# Patient Record
Sex: Female | Born: 1990 | Race: White | Hispanic: No | Marital: Married | Smoking: Never smoker
Health system: Southern US, Community
[De-identification: ages and names within clinical notes are randomized; demographics above are authoritative.]

## PROBLEM LIST (undated history)

## (undated) DIAGNOSIS — R109 Unspecified abdominal pain: Secondary | ICD-10-CM

## (undated) DIAGNOSIS — L732 Hidradenitis suppurativa: Secondary | ICD-10-CM

## (undated) DIAGNOSIS — E550 Rickets, active: Secondary | ICD-10-CM

## (undated) DIAGNOSIS — R112 Nausea with vomiting, unspecified: Secondary | ICD-10-CM

## (undated) DIAGNOSIS — K439 Ventral hernia without obstruction or gangrene: Secondary | ICD-10-CM

## (undated) DIAGNOSIS — N200 Calculus of kidney: Secondary | ICD-10-CM

## (undated) DIAGNOSIS — G8929 Other chronic pain: Secondary | ICD-10-CM

## (undated) DIAGNOSIS — K5900 Constipation, unspecified: Secondary | ICD-10-CM

## (undated) HISTORY — PX: APPENDECTOMY: SHX54

## (undated) HISTORY — PX: LEG SURGERY: SHX1003

## (undated) HISTORY — PX: OTHER SURGICAL HISTORY: SHX169

## (undated) HISTORY — PX: COLON SURGERY: SHX602

---

## 2013-03-19 ENCOUNTER — Emergency Department (HOSPITAL_COMMUNITY): Payer: Self-pay

## 2013-03-19 ENCOUNTER — Encounter (HOSPITAL_COMMUNITY): Payer: Self-pay | Admitting: *Deleted

## 2013-03-19 ENCOUNTER — Inpatient Hospital Stay (HOSPITAL_COMMUNITY)
Admission: EM | Admit: 2013-03-19 | Discharge: 2013-03-23 | DRG: 669 | Disposition: A | Discharge status: Home / Self Care | Payer: MEDICAID | Attending: Family Medicine | Admitting: Family Medicine

## 2013-03-19 DIAGNOSIS — E872 Acidosis, unspecified: Secondary | ICD-10-CM | POA: Diagnosis present

## 2013-03-19 DIAGNOSIS — N132 Hydronephrosis with renal and ureteral calculous obstruction: Secondary | ICD-10-CM

## 2013-03-19 DIAGNOSIS — Z87442 Personal history of urinary calculi: Secondary | ICD-10-CM

## 2013-03-19 DIAGNOSIS — E876 Hypokalemia: Secondary | ICD-10-CM

## 2013-03-19 DIAGNOSIS — D649 Anemia, unspecified: Secondary | ICD-10-CM | POA: Diagnosis present

## 2013-03-19 DIAGNOSIS — N179 Acute kidney failure, unspecified: Principal | ICD-10-CM

## 2013-03-19 DIAGNOSIS — N201 Calculus of ureter: Secondary | ICD-10-CM | POA: Diagnosis present

## 2013-03-19 DIAGNOSIS — N289 Disorder of kidney and ureter, unspecified: Secondary | ICD-10-CM

## 2013-03-19 DIAGNOSIS — Z8639 Personal history of other endocrine, nutritional and metabolic disease: Secondary | ICD-10-CM

## 2013-03-19 DIAGNOSIS — N390 Urinary tract infection, site not specified: Secondary | ICD-10-CM

## 2013-03-19 DIAGNOSIS — N2589 Other disorders resulting from impaired renal tubular function: Secondary | ICD-10-CM

## 2013-03-19 DIAGNOSIS — N269 Renal sclerosis, unspecified: Secondary | ICD-10-CM | POA: Diagnosis present

## 2013-03-19 DIAGNOSIS — N1330 Unspecified hydronephrosis: Secondary | ICD-10-CM | POA: Diagnosis present

## 2013-03-19 DIAGNOSIS — Z9049 Acquired absence of other specified parts of digestive tract: Secondary | ICD-10-CM

## 2013-03-19 DIAGNOSIS — D509 Iron deficiency anemia, unspecified: Secondary | ICD-10-CM

## 2013-03-19 DIAGNOSIS — Z862 Personal history of diseases of the blood and blood-forming organs and certain disorders involving the immune mechanism: Secondary | ICD-10-CM

## 2013-03-19 DIAGNOSIS — N139 Obstructive and reflux uropathy, unspecified: Secondary | ICD-10-CM | POA: Diagnosis present

## 2013-03-19 HISTORY — DX: Calculus of kidney: N20.0

## 2013-03-19 HISTORY — DX: Rickets, active: E55.0

## 2013-03-19 LAB — BASIC METABOLIC PANEL
CO2: 16 mEq/L — ABNORMAL LOW (ref 19–32)
Chloride: 103 mEq/L (ref 96–112)
GFR calc Af Amer: 28 mL/min — ABNORMAL LOW (ref >90)
Potassium: 2.8 mEq/L — ABNORMAL LOW (ref 3.5–5.1)
Sodium: 137 mEq/L (ref 135–145)

## 2013-03-19 LAB — CBC WITH DIFFERENTIAL/PLATELET
Basophils Absolute: 0 10*3/uL (ref 0.0–0.1)
Basophils Relative: 0 % (ref 0–1)
HCT: 34.9 % — ABNORMAL LOW (ref 36.0–46.0)
Lymphocytes Relative: 11 % — ABNORMAL LOW (ref 12–46)
Monocytes Absolute: 0.7 10*3/uL (ref 0.1–1.0)
Neutro Abs: 8.8 10*3/uL — ABNORMAL HIGH (ref 1.7–7.7)
Neutrophils Relative %: 82 % — ABNORMAL HIGH (ref 43–77)
Platelets: 264 10*3/uL (ref 150–400)
RDW: 17.2 % — ABNORMAL HIGH (ref 11.5–15.5)
WBC: 10.7 10*3/uL — ABNORMAL HIGH (ref 4.0–10.5)

## 2013-03-19 LAB — URINALYSIS, ROUTINE W REFLEX MICROSCOPIC
Glucose, UA: NEGATIVE mg/dL
Nitrite: NEGATIVE
pH: 7 (ref 5.0–8.0)

## 2013-03-19 LAB — URINE MICROSCOPIC-ADD ON

## 2013-03-19 LAB — POCT PREGNANCY, URINE: Preg Test, Ur: NEGATIVE

## 2013-03-19 MED ORDER — DEXTROSE 5 % IV SOLN
1.0000 g | Freq: Once | INTRAVENOUS | Status: AC
  Administered 2013-03-19: 1 g via INTRAVENOUS
  Filled 2013-03-19: qty 10

## 2013-03-19 MED ORDER — HYDROMORPHONE HCL PF 1 MG/ML IJ SOLN
1.0000 mg | Freq: Once | INTRAMUSCULAR | Status: AC
  Administered 2013-03-19: 1 mg via INTRAVENOUS
  Filled 2013-03-19: qty 1

## 2013-03-19 MED ORDER — ONDANSETRON HCL 4 MG/2ML IJ SOLN: 4.0000 mg | Freq: Three times a day (TID) | INTRAMUSCULAR | Status: DC | PRN

## 2013-03-19 MED ORDER — ONDANSETRON HCL 4 MG/2ML IJ SOLN
4.0000 mg | Freq: Once | INTRAMUSCULAR | Status: AC
  Administered 2013-03-19: 4 mg via INTRAVENOUS
  Filled 2013-03-19: qty 2

## 2013-03-19 MED ORDER — SODIUM CHLORIDE 0.9 % IV SOLN
INTRAVENOUS | Status: DC
  Administered 2013-03-19: 19:00:00 via INTRAVENOUS

## 2013-03-19 MED ORDER — HYDROMORPHONE HCL PF 1 MG/ML IJ SOLN: 1.0000 mg | INTRAMUSCULAR | Status: DC | PRN

## 2013-03-19 NOTE — ED Provider Notes (Signed)
History    CSN: [id number] Arrival date & time 03/19/13  1624  First MD Initiated Contact with Patient 03/19/13 1758     Chief Complaint  Patient presents with  . Flank Pain   (Consider location/radiation/quality/duration/timing/severity/associated sxs/prior Treatment) Patient is a 22 y.o. female presenting with flank pain.  Flank Pain Pertinent negatives include no chest pain, no abdominal pain, no headaches and no shortness of breath.   patient states that she's had flank pain for last week or 2. She states she was seen at [facility] and was given pain medicines. She states a CT was done. She states she's gotten worse. She's not exactly sure when she was seen there. He was with her last few days. She states she only has one kidney. She states she's had previous kidney stones. She's had some chills but denies fevers. She states she's had vomiting. She states she's gotten worse and she went to the hospital. She also states that her period was delayed and then started up and has been heavy.   Past Medical History  Diagnosis Date  . Kidney stones   . Rickets    Past Surgical History  Procedure Laterality Date  . Ureteral stents    . Has only 1 functioning  kidney    . Colon surgery    . Appendectomy    . Leg surgery     History reviewed. No pertinent family history. History  Substance Use Topics  . Smoking status: Never Smoker   . Smokeless tobacco: Not on file  . Alcohol Use: No   OB History   Grav Para Term Preterm Abortions TAB SAB Ect Mult Living                 Review of Systems  Constitutional: Positive for chills. Negative for activity change and appetite change.  HENT: Negative for neck stiffness.   Eyes: Negative for pain.  Respiratory: Negative for chest tightness and shortness of breath.   Cardiovascular: Negative for chest pain and leg swelling.  Gastrointestinal: Positive for nausea and vomiting. Negative for abdominal pain and diarrhea.   Genitourinary: Positive for flank pain and vaginal bleeding.  Musculoskeletal: Negative for back pain.  Skin: Negative for rash.  Neurological: Negative for weakness, numbness and headaches.  Psychiatric/Behavioral: Negative for behavioral problems.    Allergies  Review of patient's allergies indicates no known allergies.  Home Medications   Current Outpatient Rx  Name  Route  Sig  Dispense  Refill  . acetaminophen (TYLENOL) 500 MG tablet   Oral   Take 500-1,000 mg by mouth every 6 (six) hours as needed for pain.         . citalopram (CELEXA) 20 MG tablet   Oral   Take 20 mg by mouth daily.         Marland Kitchen HYDROcodone-acetaminophen (NORCO/VICODIN) 5-325 MG per tablet   Oral   Take 1 tablet by mouth every 6 (six) hours as needed for pain.         Marland Kitchen sulfamethoxazole-trimethoprim (BACTRIM DS,SEPTRA DS) 800-160 MG per tablet   Oral   Take 1 tablet by mouth 2 (two) times daily. For 10 days          BP 104/50  Pulse 80  Temp(Src) 98.9 F (37.2 C) (Oral)  Resp 19  Ht 4\' 11"  (1.499 m)  Wt 210 lb (95.255 kg)  BMI 42.39 kg/m2  SpO2 100%  LMP 03/17/2013 Physical Exam  Nursing note and vitals reviewed. Constitutional:  She is oriented to person, place, and time. She appears well-developed and well-nourished.  Patient is obese  HENT:  Head: Normocephalic and atraumatic.  Eyes: EOM are normal. Pupils are equal, round, and reactive to light.  Neck: Normal range of motion. Neck supple.  Cardiovascular: Normal rate, regular rhythm and normal heart sounds.   No murmur heard. Pulmonary/Chest: Effort normal and breath sounds normal. No respiratory distress. She has no wheezes. She has no rales.  Abdominal: Soft. Bowel sounds are normal. She exhibits no distension. There is tenderness. There is no rebound and no guarding.  Mild right lower quadrant abdominal pain. No rebound or guarding. She is obese  Genitourinary:  CVA tenderness on right  Musculoskeletal: Normal range of  motion.  Neurological: She is alert and oriented to person, place, and time. No cranial nerve deficit.  Skin: Skin is warm and dry.  Psychiatric: She has a normal mood and affect. Her speech is normal.    ED Course  Procedures (including critical care time) Labs Reviewed  URINALYSIS, ROUTINE W REFLEX MICROSCOPIC - Abnormal; Notable for the following:    APPearance HAZY (*)    Specific Gravity, Urine <1.005 (*)    Hgb urine dipstick LARGE (*)    Protein, ur TRACE (*)    Leukocytes, UA LARGE (*)    All other components within normal limits  URINE MICROSCOPIC-ADD ON - Abnormal; Notable for the following:    Squamous Epithelial / LPF FEW (*)    Bacteria, UA FEW (*)    All other components within normal limits  CBC WITH DIFFERENTIAL - Abnormal; Notable for the following:    WBC 10.7 (*)    Hemoglobin 10.8 (*)    HCT 34.9 (*)    MCV 77.4 (*)    MCH 23.9 (*)    RDW 17.2 (*)    Neutrophils Relative % 82 (*)    Neutro Abs 8.8 (*)    Lymphocytes Relative 11 (*)    All other components within normal limits  BASIC METABOLIC PANEL - Abnormal; Notable for the following:    Potassium 2.8 (*)    CO2 16 (*)    BUN 30 (*)    Creatinine, Ser 2.67 (*)    Calcium 8.3 (*)    GFR calc non Af Amer 25 (*)    GFR calc Af Amer 28 (*)    All other components within normal limits  URINE CULTURE  POCT PREGNANCY, URINE   Ct Abdomen Pelvis Wo Contrast  03/19/2013   *RADIOLOGY REPORT*  Clinical Data: Right flank pain for 2 weeks, vomiting, history of kidney stones  CT ABDOMEN AND PELVIS WITHOUT CONTRAST  Technique:  Multidetector CT imaging of the abdomen and pelvis was performed following the standard protocol without intravenous contrast.  Comparison: None.  Findings: The lung bases are clear.  The liver is unremarkable in the unenhanced state.  No calcified gallstones are seen.  The pancreas is normal in size.  The adrenal glands and spleen are unremarkable.  The stomach is moderately fluid distended  with no abnormality noted.  The left kidney is markedly atrophic and it does contain multiple calcifications.  The right kidney appears compensatorily hypertrophied.  There are multiple calcifications within the medullary portions of the right kidney consistent with medullary sponge kidney.   The largest calculus is in the lower pole collecting system of the right kidney measuring 18 mm in diameter. However, there is moderate hydronephrosis present with hydroureter. The point of obstruction is within  the mid pelvis and there appear to be at least three calculi stacked upon each other of approximately 5, 6, and 7 mm in diameter respectively.  The more distal right ureter is normal in caliber.  The left ureter is small to the urinary bladder.  The urinary bladder is decompressed.  The uterus is anteverted.  No fluid is seen within the pelvis.  The colon is decompressed and the terminal ileum is unremarkable.  There are three midline anterior abdominal wall hernias present. These hernias contain fat and nondistended loops of bowel. Minimally prominent groin lymph nodes are noted bilaterally.  IMPRESSION:  1.  Moderate right hydronephrosis and hydroureter to a point of partial obstruction by distal right ureteral calculi stacked upon each other of5, 6 and 7 mm in diameter respectively. 2.  Multiple right renal calculi consistent with medullary sponge kidney, and the right kidney is compensatorily hypertrophied. 3.  Atrophic left kidney with multiple calcifications as well.   Original Report Authenticated By: Dwyane Dee, M.D.   1. Ureteral stone with hydronephrosis   2. Renal insufficiency   3. UTI (urinary tract infection)     MDM   patient with right flank pain. It is possible UTI. Also has worsening renal insufficiency with a creatinine that has gone from 1.6 early in the morning yesterday to 2.6 today. CT showed hydronephrosis and 1 atrophic kidney on the other side. Patient be admitted to urology. I  discussed with Dr. Charlies Silvers R. Rubin Payor, MD 03/19/13 2123

## 2013-03-19 NOTE — ED Notes (Signed)
Rt flank pain for 2 weeks, says she was seen at [facility] for same. Vomiting, for last few days.  Hx of kidney stones

## 2013-03-19 NOTE — ED Notes (Signed)
Pt c/o right side flank pain x1 week. Pt was seen at [facility] last week and told she had multiple kidney stones. Pt states she has not passed a stone and reports symptoms have worsened. Pt also states "I went 3 months without a period but this week I began bleeding heavily".

## 2013-03-19 NOTE — ED Notes (Signed)
Patient ambulatory to restroom  ?

## 2013-03-20 ENCOUNTER — Encounter (HOSPITAL_COMMUNITY): Payer: Self-pay | Admitting: Internal Medicine

## 2013-03-20 DIAGNOSIS — N179 Acute kidney failure, unspecified: Secondary | ICD-10-CM | POA: Diagnosis present

## 2013-03-20 DIAGNOSIS — N132 Hydronephrosis with renal and ureteral calculous obstruction: Secondary | ICD-10-CM | POA: Diagnosis present

## 2013-03-20 DIAGNOSIS — Z8639 Personal history of other endocrine, nutritional and metabolic disease: Secondary | ICD-10-CM | POA: Diagnosis present

## 2013-03-20 DIAGNOSIS — N390 Urinary tract infection, site not specified: Secondary | ICD-10-CM | POA: Diagnosis present

## 2013-03-20 DIAGNOSIS — D649 Anemia, unspecified: Secondary | ICD-10-CM | POA: Diagnosis present

## 2013-03-20 DIAGNOSIS — E876 Hypokalemia: Secondary | ICD-10-CM | POA: Diagnosis present

## 2013-03-20 DIAGNOSIS — D509 Iron deficiency anemia, unspecified: Secondary | ICD-10-CM | POA: Diagnosis present

## 2013-03-20 DIAGNOSIS — N2589 Other disorders resulting from impaired renal tubular function: Secondary | ICD-10-CM | POA: Diagnosis present

## 2013-03-20 LAB — URINALYSIS, ROUTINE W REFLEX MICROSCOPIC
Bilirubin Urine: NEGATIVE
Ketones, ur: NEGATIVE mg/dL
Nitrite: NEGATIVE
Specific Gravity, Urine: <1.005 — ABNORMAL LOW (ref 1.005–1.030)
Urobilinogen, UA: 0.2 mg/dL (ref 0.0–1.0)
pH: 7 (ref 5.0–8.0)

## 2013-03-20 LAB — FOLATE: Folate: >20 ng/mL

## 2013-03-20 LAB — COMPREHENSIVE METABOLIC PANEL
Alkaline Phosphatase: 88 U/L (ref 39–117)
BUN: 30 mg/dL — ABNORMAL HIGH (ref 6–23)
Chloride: 111 mEq/L (ref 96–112)
GFR calc Af Amer: 31 mL/min — ABNORMAL LOW (ref >90)
Glucose, Bld: 82 mg/dL (ref 70–99)
Potassium: 3.2 mEq/L — ABNORMAL LOW (ref 3.5–5.1)
Total Bilirubin: 0.3 mg/dL (ref 0.3–1.2)
Total Protein: 6.9 g/dL (ref 6.0–8.3)

## 2013-03-20 LAB — URINE MICROSCOPIC-ADD ON

## 2013-03-20 LAB — NA AND K (SODIUM & POTASSIUM), RAND UR: Potassium Urine: 10 mEq/L

## 2013-03-20 LAB — CBC
Platelets: 225 10*3/uL (ref 150–400)
RDW: 17.4 % — ABNORMAL HIGH (ref 11.5–15.5)
WBC: 8.2 10*3/uL (ref 4.0–10.5)

## 2013-03-20 LAB — RETICULOCYTES: Retic Count, Absolute: 93.6 10*3/uL (ref 19.0–186.0)

## 2013-03-20 LAB — HEPATIC FUNCTION PANEL
ALT: 18 U/L (ref 0–35)
AST: 18 U/L (ref 0–37)
Total Protein: 7.8 g/dL (ref 6.0–8.3)

## 2013-03-20 LAB — CHLORIDE, URINE, RANDOM: Chloride Urine: 32 mEq/L

## 2013-03-20 LAB — BASIC METABOLIC PANEL
Chloride: 109 mEq/L (ref 96–112)
GFR calc Af Amer: 30 mL/min — ABNORMAL LOW (ref >90)
GFR calc non Af Amer: 26 mL/min — ABNORMAL LOW (ref >90)
Potassium: 3.2 mEq/L — ABNORMAL LOW (ref 3.5–5.1)
Sodium: 140 mEq/L (ref 135–145)

## 2013-03-20 LAB — IRON AND TIBC: Iron: 28 ug/dL — ABNORMAL LOW (ref 42–135)

## 2013-03-20 MED ORDER — CEFTRIAXONE SODIUM 1 G IJ SOLR
1.0000 g | INTRAMUSCULAR | Status: DC
  Administered 2013-03-20 – 2013-03-22 (×3): 1 g via INTRAVENOUS
  Filled 2013-03-20 (×6): qty 10

## 2013-03-20 MED ORDER — POTASSIUM CITRATE ER 10 MEQ (1080 MG) PO TBCR
20.0000 meq | EXTENDED_RELEASE_TABLET | Freq: Three times a day (TID) | ORAL | Status: DC
  Administered 2013-03-21 – 2013-03-22 (×2): 20 meq via ORAL
  Filled 2013-03-20 (×8): qty 2

## 2013-03-20 MED ORDER — ALUM & MAG HYDROXIDE-SIMETH 200-200-20 MG/5ML PO SUSP
30.0000 mL | Freq: Four times a day (QID) | ORAL | Status: DC | PRN
  Administered 2013-03-23: 30 mL via ORAL
  Filled 2013-03-20: qty 30

## 2013-03-20 MED ORDER — POTASSIUM CHLORIDE CRYS ER 20 MEQ PO TBCR
20.0000 meq | EXTENDED_RELEASE_TABLET | Freq: Once | ORAL | Status: AC
  Administered 2013-03-20: 20 meq via ORAL
  Filled 2013-03-20: qty 1

## 2013-03-20 MED ORDER — CITALOPRAM HYDROBROMIDE 20 MG PO TABS
20.0000 mg | ORAL_TABLET | Freq: Every day | ORAL | Status: DC
  Administered 2013-03-20 – 2013-03-22 (×4): 20 mg via ORAL
  Filled 2013-03-20 (×4): qty 1

## 2013-03-20 MED ORDER — ACETAMINOPHEN 650 MG RE SUPP: 650.0000 mg | Freq: Four times a day (QID) | RECTAL | Status: DC | PRN

## 2013-03-20 MED ORDER — HYDROMORPHONE HCL PF 1 MG/ML IJ SOLN
1.0000 mg | INTRAMUSCULAR | Status: DC | PRN
  Administered 2013-03-20 – 2013-03-22 (×13): 1 mg via INTRAVENOUS
  Filled 2013-03-20 (×13): qty 1

## 2013-03-20 MED ORDER — SODIUM CHLORIDE 0.45 % IV SOLN
INTRAVENOUS | Status: DC
  Administered 2013-03-20 (×2): via INTRAVENOUS
  Filled 2013-03-20 (×4): qty 1000

## 2013-03-20 MED ORDER — ACETAMINOPHEN 325 MG PO TABS: 650.0000 mg | ORAL_TABLET | Freq: Four times a day (QID) | ORAL | Status: DC | PRN

## 2013-03-20 MED ORDER — SODIUM CHLORIDE 0.9 % IV SOLN
INTRAVENOUS | Status: DC
  Administered 2013-03-20: 01:00:00 via INTRAVENOUS

## 2013-03-20 MED ORDER — PROMETHAZINE HCL 25 MG/ML IJ SOLN
12.5000 mg | Freq: Four times a day (QID) | INTRAMUSCULAR | Status: DC | PRN
  Administered 2013-03-20 – 2013-03-21 (×5): 12.5 mg via INTRAVENOUS
  Filled 2013-03-20 (×5): qty 1

## 2013-03-20 MED ORDER — SODIUM CHLORIDE 0.9 % IV SOLN
INTRAVENOUS | Status: DC
  Administered 2013-03-20 – 2013-03-21 (×3): via INTRAVENOUS

## 2013-03-20 MED ORDER — ONDANSETRON HCL 4 MG/2ML IJ SOLN
4.0000 mg | Freq: Four times a day (QID) | INTRAMUSCULAR | Status: DC | PRN
  Administered 2013-03-21 – 2013-03-22 (×4): 4 mg via INTRAVENOUS
  Filled 2013-03-20 (×4): qty 2

## 2013-03-20 MED ORDER — CITALOPRAM HYDROBROMIDE 20 MG PO TABS
20.0000 mg | ORAL_TABLET | Freq: Every day | ORAL | Status: DC
  Filled 2013-03-20: qty 1

## 2013-03-20 MED ORDER — POTASSIUM CHLORIDE CRYS ER 20 MEQ PO TBCR
20.0000 meq | EXTENDED_RELEASE_TABLET | Freq: Two times a day (BID) | ORAL | Status: DC
  Administered 2013-03-20: 20 meq via ORAL
  Filled 2013-03-20: qty 1

## 2013-03-20 MED ORDER — ONDANSETRON HCL 4 MG PO TABS
4.0000 mg | ORAL_TABLET | Freq: Four times a day (QID) | ORAL | Status: DC | PRN
  Administered 2013-03-20 – 2013-03-23 (×3): 4 mg via ORAL
  Filled 2013-03-20 (×3): qty 1

## 2013-03-20 NOTE — Consult Note (Signed)
Chelsea [name], Chelsea [name]            ACCOUNT NO.:  [id number]  MEDICAL RECORD NO.:  [id number]  LOCATION:  A320                          FACILITY:  [facility]  PHYSICIAN:  Ky Barban, M.D.DATE OF BIRTH:  02-05-1991  DATE OF CONSULTATION: DATE OF DISCHARGE:                                CONSULTATION   CHIEF COMPLAINT:  Right renal colic.  HISTORY:  A 22 year old female, who is having pain in the right flank associated with nausea and vomiting.  It is going on for the last 1 week.  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  Only procedure she had so far is, she does not even remember very clearly, it was the stent placement in January probably this year.  She denies having any lithotripsies or any open surgery for kidney stones.  She did have a colon resection for intestinal obstruction last year.  She has only right kidney functioning and the left kidney is poorly functioning.  She has history of having appendectomy and leg surgery and colon resection from intestinal obstruction and no history of diabetes or hypertension.  FAMILY HISTORY:  Negative.  PERSONAL HISTORY:  Does not smoke or drink.  REVIEW OF SYSTEMS:  Denies any chest pain, orthopnea, PND.  She did have nausea and vomiting.  She did go few days ago to [facility] where she was told that she will be able to pass the stone but she has not, so she came here.  PHYSICAL EXAMINATION:  VITAL SIGNS:  Her blood pressure is 107/75, temperature is 98.9.  LABORATORY DATA:  Urinalysis shows large amount of hemoglobin on dip stick, ketone negative, large amount of leukocytes, 7-10 RBCs. Pregnancy test is negative.  WBC count is 10.7, hematocrit is 34.9, sodium 137, potassium 2.8, chloride 103, CO2 is 16.  IMAGING DATA:  CT abdomen and pelvis was done.  It shows poorly functioning left kidney and calcification deposit in the medullary area of the kidney on both sides, large  stone in the lower pole of the right kidney, right hydronephrosis down to 3 stones which are located in the distal right ureter with hydroureter.  Bladder appears to be normal. There are 3 midline anterior abdominal wall hernias present.  These hernias contain fat and nondistended loops of bowel minimally.  Lymph nodes are minimal.  Prominent growing lymph nodes are noted bilaterally.  IMPRESSION:  Right hydronephrosis, right bilateral renal calcification, medullary sponge kidney, and large right renal calculus, distal right ureteral calculi with obstruction.  Compensatory hypertrophy of the right kidney.  She does have hypokalemia.  When she came in, she was started on IV normal saline plus KCl.  This morning, her potassium has come up almost to normal level.  RECOMMENDATIONS:  My recommendation for distal right ureteral calculi will be ESL.  I told her that I can try to do lithotripsy, which will be done tomorrow as outpatient.  She can be discharged.  She is completely comfortable now.  She can be discharged home on oral potassium and schedule for lithotripsy for distal right ureteral calculi and I told her about the procedure, risks, limitation, complications, need for additional procedures.  I told her the seriousness of the problem she has because she seems to be not very much interested when I am talking to her and her husband.  Her BUN was 30 and  creatinine 2.67.  I have told her that I do not give any guarantees that this procedure of lithotripsy will work.  We need to try that.  If that does not work, she may need to have a stone basket at a later time.  She can be discharged home on oral potassium and she needs to have a Nephrology consult which probably can be done as outpatient.  Her creatinine which was 2.67 yesterday came down to 2.52 this morning.  Her potassium which was 2.8 yesterday; today, it is 3.2.  Urine culture has been done and it is pending.  She is taking IV  Rocephin.  Because of this pyuria, I want to give her antibiotics but I am waiting for the urine culture report which should be back today or tomorrow.  In the meantime, she can be discharged and come back as outpatient tomorrow to undergo ESL for distal right ureteral calculi.     Ky Barban, M.D.     MIJ/MEDQ  D:  03/20/2013  T:  03/20/2013  Job:  [id number]

## 2013-03-20 NOTE — H&P (Signed)
[facility] History and Physical  Chelsea [name] ZOX:[id number] DOB: 1991/03/18 DOA: 03/19/2013   PCP: None Specialists: She used to be followed by a urologist at [facility] and then subsequently, in [place], however, because of lack of insurance she has not been able to see one recently  Chief Complaint: Right-sided flank pain, and nausea and vomiting  HPI: Chelsea [name] is a 22 y.o. female with a past medical history of rickets disease, renal tubular acidosis, nephrolithiasis, who was in her usual state of health till about a week ago, when she started having pain in the right flank area. This was followed by onset of nausea and vomiting. She has vomited innumerable amounts of time. Denies any blood in the emesis. She's had poor oral intake the last 3 days. Denies any fever or chills. Denies any diarrhea. She went to the [facility] emergency department about a week ago, where she was told that she has kidney stones, and was treated with antiemetics, and narcotics and was discharged home. However, patient couldn't pass her stones. Her symptoms continued to persist. She had difficulty urinating. She didn't see any frank blood. She denies any dysuria. She's had decreased amount of urination, along with dribbling urination. Since her symptoms were not improving she decided to come in to the emergency department here.  Home Medications: Prior to Admission medications   Medication Sig Start Date End Date Taking? Authorizing Provider  acetaminophen (TYLENOL) 500 MG tablet Take 500-1,000 mg by mouth every 6 (six) hours as needed for pain.   Yes Historical Provider, MD  citalopram (CELEXA) 20 MG tablet Take 20 mg by mouth daily.   Yes Historical Provider, MD  HYDROcodone-acetaminophen (NORCO/VICODIN) 5-325 MG per tablet Take 1 tablet by mouth every 6 (six) hours as needed for pain.   Yes Historical Provider, MD  sulfamethoxazole-trimethoprim (BACTRIM DS,SEPTRA DS) 800-160 MG per tablet Take 1  tablet by mouth 2 (two) times daily. For 10 days 03/16/13  Yes Historical Provider, MD    Allergies: No Known Allergies  Past Medical History: Past Medical History  Diagnosis Date  . Kidney stones   . Rickets     Past Surgical History  Procedure Laterality Date  . Ureteral stents    . Has only 1 functioning  kidney    . Colon surgery    . Appendectomy    . Leg surgery      Social History:  reports that she has never smoked. She does not have any smokeless tobacco history on file. She reports that she does not drink alcohol or use illicit drugs.  Living Situation: Lives in [place], with her husband Activity Level: Usually independent with daily activities   Family History:  Family History  Problem Relation Age of Onset  . Kidney disease Father      Review of Systems - History obtained from the patient General ROS: positive for  - fatigue Psychological ROS: negative Ophthalmic ROS: negative ENT ROS: negative Allergy and Immunology ROS: negative Hematological and Lymphatic ROS: negative Endocrine ROS: negative Respiratory ROS: no cough, shortness of breath, or wheezing Cardiovascular ROS: no chest pain or dyspnea on exertion Gastrointestinal ROS: no abdominal pain, change in bowel habits, or black or bloody stools Genito-Urinary ROS: as in hpi Musculoskeletal ROS: negative Neurological ROS: no TIA or stroke symptoms Dermatological ROS: negative  Physical Examination  Filed Vitals:   03/19/13 1643 03/19/13 2206 03/19/13 2237  BP: 104/50 104/42 107/75  Pulse: 80  73  Temp: 98.9 F (37.2 C) 98.6  F (37 C) 97.8 F (36.6 C)  TempSrc: Oral Oral Oral  Resp: 19 20   Height: 4\' 11"  (1.499 m)  4\' 11"  (1.499 m)  Weight: 95.255 kg (210 lb)  104.282 kg (229 lb 14.4 oz)  SpO2: 100% 100% 98%    General appearance: alert, cooperative, appears stated age, no distress and moderately obese Head: Normocephalic, without obvious abnormality, atraumatic Eyes:  conjunctivae/corneas clear. PERRL, EOM's intact. Throat: lips, mucosa, and tongue normal; teeth and gums normal Neck: no adenopathy, no carotid bruit, no JVD, supple, symmetrical, trachea midline and thyroid not enlarged, symmetric, no tenderness/mass/nodules Back: She has mild right-sided CVA tenderness. Resp: clear to auscultation bilaterally Cardio: regular rate and rhythm, S1, S2 normal, no murmur, click, rub or gallop GI: soft, non-tender; bowel sounds normal; no masses,  no organomegaly Extremities: extremities normal, atraumatic, no cyanosis or edema Pulses: 2+ and symmetric Skin: Skin color, texture, turgor normal. No rashes or lesions Lymph nodes: Cervical, supraclavicular, and axillary nodes normal. Neurologic: She is alert and oriented x3. No focal neurological deficits are present.  Laboratory Data: Results for orders placed during the hospital encounter of 03/19/13 (from the past 48 hour(s))  URINALYSIS, ROUTINE W REFLEX MICROSCOPIC     Status: Abnormal   Collection Time    03/19/13  4:59 PM      Result Value Range   Color, Urine YELLOW  YELLOW   APPearance HAZY (*) CLEAR   Specific Gravity, Urine <1.005 (*) 1.005 - 1.030   pH 7.0  5.0 - 8.0   Glucose, UA NEGATIVE  NEGATIVE mg/dL   Hgb urine dipstick LARGE (*) NEGATIVE   Bilirubin Urine NEGATIVE  NEGATIVE   Ketones, ur NEGATIVE  NEGATIVE mg/dL   Protein, ur TRACE (*) NEGATIVE mg/dL   Urobilinogen, UA 0.2  0.0 - 1.0 mg/dL   Nitrite NEGATIVE  NEGATIVE   Leukocytes, UA LARGE (*) NEGATIVE  URINE MICROSCOPIC-ADD ON     Status: Abnormal   Collection Time    03/19/13  4:59 PM      Result Value Range   Squamous Epithelial / LPF FEW (*) RARE   WBC, UA 21-50  <3 WBC/hpf   RBC / HPF 7-10  <3 RBC/hpf   Bacteria, UA FEW (*) RARE  POCT PREGNANCY, URINE     Status: None   Collection Time    03/19/13  5:01 PM      Result Value Range   Preg Test, Ur NEGATIVE  NEGATIVE   Comment:            THE SENSITIVITY OF THIS      METHODOLOGY IS >24 mIU/mL  CBC WITH DIFFERENTIAL     Status: Abnormal   Collection Time    03/19/13  6:36 PM      Result Value Range   WBC 10.7 (*) 4.0 - 10.5 K/uL   RBC 4.51  3.87 - 5.11 MIL/uL   Hemoglobin 10.8 (*) 12.0 - 15.0 g/dL   HCT 16.1 (*) 09.6 - 04.5 %   MCV 77.4 (*) 78.0 - 100.0 fL   MCH 23.9 (*) 26.0 - 34.0 pg   MCHC 30.9  30.0 - 36.0 g/dL   RDW 40.9 (*) 81.1 - 91.4 %   Platelets 264  150 - 400 K/uL   Neutrophils Relative % 82 (*) 43 - 77 %   Neutro Abs 8.8 (*) 1.7 - 7.7 K/uL   Lymphocytes Relative 11 (*) 12 - 46 %   Lymphs Abs 1.2  0.7 -  4.0 K/uL   Monocytes Relative 6  3 - 12 %   Monocytes Absolute 0.7  0.1 - 1.0 K/uL   Eosinophils Relative 1  0 - 5 %   Eosinophils Absolute 0.1  0.0 - 0.7 K/uL   Basophils Relative 0  0 - 1 %   Basophils Absolute 0.0  0.0 - 0.1 K/uL  BASIC METABOLIC PANEL     Status: Abnormal   Collection Time    03/19/13  6:36 PM      Result Value Range   Sodium 137  135 - 145 mEq/L   Potassium 2.8 (*) 3.5 - 5.1 mEq/L   Chloride 103  96 - 112 mEq/L   CO2 16 (*) 19 - 32 mEq/L   Glucose, Bld 87  70 - 99 mg/dL   BUN 30 (*) 6 - 23 mg/dL   Creatinine, Ser 1.61 (*) 0.50 - 1.10 mg/dL   Calcium 8.3 (*) 8.4 - 10.5 mg/dL   GFR calc non Af Amer 25 (*) >90 mL/min   GFR calc Af Amer 28 (*) >90 mL/min   Comment:            The eGFR has been calculated     using the CKD EPI equation.     This calculation has not been     validated in all clinical     situations.     eGFR's persistently     <90 mL/min signify     possible Chronic Kidney Disease.    Radiology Reports: Ct Abdomen Pelvis Wo Contrast  03/19/2013   *RADIOLOGY REPORT*  Clinical Data: Right flank pain for 2 weeks, vomiting, history of kidney stones  CT ABDOMEN AND PELVIS WITHOUT CONTRAST  Technique:  Multidetector CT imaging of the abdomen and pelvis was performed following the standard protocol without intravenous contrast.  Comparison: None.  Findings: The lung bases are clear.  The liver  is unremarkable in the unenhanced state.  No calcified gallstones are seen.  The pancreas is normal in size.  The adrenal glands and spleen are unremarkable.  The stomach is moderately fluid distended with no abnormality noted.  The left kidney is markedly atrophic and it does contain multiple calcifications.  The right kidney appears compensatorily hypertrophied.  There are multiple calcifications within the medullary portions of the right kidney consistent with medullary sponge kidney.   The largest calculus is in the lower pole collecting system of the right kidney measuring 18 mm in diameter. However, there is moderate hydronephrosis present with hydroureter. The point of obstruction is within the mid pelvis and there appear to be at least three calculi stacked upon each other of approximately 5, 6, and 7 mm in diameter respectively.  The more distal right ureter is normal in caliber.  The left ureter is small to the urinary bladder.  The urinary bladder is decompressed.  The uterus is anteverted.  No fluid is seen within the pelvis.  The colon is decompressed and the terminal ileum is unremarkable.  There are three midline anterior abdominal wall hernias present. These hernias contain fat and nondistended loops of bowel. Minimally prominent groin lymph nodes are noted bilaterally.  IMPRESSION:  1.  Moderate right hydronephrosis and hydroureter to a point of partial obstruction by distal right ureteral calculi stacked upon each other of5, 6 and 7 mm in diameter respectively. 2.  Multiple right renal calculi consistent with medullary sponge kidney, and the right kidney is compensatorily hypertrophied. 3.  Atrophic left kidney with multiple  calcifications as well.   Original Report Authenticated By: Dwyane Dee, M.D.   Dg Abd 1 View  03/19/2013   *RADIOLOGY REPORT*  Clinical Data: Flank pain.  Known renal stones.  ABDOMEN - 1 VIEW  Comparison: CT 03/19/2013  Findings: Multiple right renal calcifications are  again noted as seen on CT.  Calcifications in the right side of the pelvis likely reflect the ureteral stones seen by CT.  No real change since prior study.  Moderate stool burden in the right side of the colon.  No evidence of bowel obstruction.  No free air organomegaly.  IMPRESSION: Stable right renal calcifications and mid to distal right ureteral stones.   Original Report Authenticated By: Charlett Nose, M.D.    Problem List  Principal Problem:   ARF (acute renal failure) Active Problems:   UTI (urinary tract infection)   Ureteral stone with hydronephrosis   History of rickets   RTA (renal tubular acidosis)   Assessment: This is a 22 year old, Caucasian female, with a past medical history as stated earlier, who presents with right flank pain with nausea and vomiting. She's found to have ureterolithiasis with hydronephrosis and has acute renal failure and UTI. She also has microcytic anemia.  Plan: #1 ureterolithiasis with hydronephrosis: Urology has been notified and further management will be deferred to them. Pain control will be provided.  #2 acute renal failure with hypokalemia: Most likely secondary to the above, as well as the fact, that she has been vomiting. We will give her IV fluids, and monitor her renal function closely. We'll give her a small dose of potassium as well.  #3 urinary tract infection: Urine cultures will be followed up on. She will be continued on ceftriaxone.  #4 history of rickets and renal tubular acidosis: This is the most likely reason for her nephrolithiasis. She might benefit from seeing a nephrologist at some point.  #5 microcytic anemia: There are reports, that she's had irregular menstruation. Urine pregnancy is negative. Will check an anemia panel. She will need to be evaluated by a gynecologist at some point.  DVT Prophylaxis: SCDs Code Status: Full code Family Communication: Discussed with the patient  Disposition Plan: Admit to Med  Surg  Further management decisions will depend on results of further testing and patient's response to treatment.  [facility]  [facility] Pager [phone]  If 7PM-7AM, please contact night-coverage [email or web address] Password [email or web address]  03/20/2013, 12:30 AM

## 2013-03-20 NOTE — Consult Note (Signed)
[id number] report#

## 2013-03-20 NOTE — Consult Note (Addendum)
Reason for Consult: Renal failure and RTA Referring Physician: [name] is an 22 y.o. female.  HPI: She is a patient with type I RTA, kidney stone presently came with complaints of flank pain, decreased urine output. Initially she was seen at [facility] for many years. Recently because of lack of insurance patient didn't go for followup. According to patient she was told she has a problem with her kidney function. She is aware her left kidney is not functioning. At this moment she doesn't know how high her creatinine was before. Presently her patient complains of flank pain but no nausea or vomiting. She denies any fever chills or sweating.  Past Medical History  Diagnosis Date  . Kidney stones   . Rickets     Past Surgical History  Procedure Laterality Date  . Ureteral stents    . Has only 1 functioning  kidney    . Colon surgery    . Appendectomy    . Leg surgery      Family History  Problem Relation Age of Onset  . Kidney disease Father     Social History:  reports that she has never smoked. She does not have any smokeless tobacco history on file. She reports that she does not drink alcohol or use illicit drugs.  Allergies: No Known Allergies  Medications: I have reviewed the patient's current medications.  Results for orders placed during the hospital encounter of 03/19/13 (from the past 48 hour(s))  URINALYSIS, ROUTINE W REFLEX MICROSCOPIC     Status: Abnormal   Collection Time    03/19/13  4:59 PM      Result Value Range   Color, Urine YELLOW  YELLOW   APPearance HAZY (*) CLEAR   Specific Gravity, Urine <1.005 (*) 1.005 - 1.030   pH 7.0  5.0 - 8.0   Glucose, UA NEGATIVE  NEGATIVE mg/dL   Hgb urine dipstick LARGE (*) NEGATIVE   Bilirubin Urine NEGATIVE  NEGATIVE   Ketones, ur NEGATIVE  NEGATIVE mg/dL   Protein, ur TRACE (*) NEGATIVE mg/dL   Urobilinogen, UA 0.2  0.0 - 1.0 mg/dL   Nitrite NEGATIVE  NEGATIVE   Leukocytes, UA LARGE (*) NEGATIVE   URINE MICROSCOPIC-ADD ON     Status: Abnormal   Collection Time    03/19/13  4:59 PM      Result Value Range   Squamous Epithelial / LPF FEW (*) RARE   WBC, UA 21-50  <3 WBC/hpf   RBC / HPF 7-10  <3 RBC/hpf   Bacteria, UA FEW (*) RARE  POCT PREGNANCY, URINE     Status: None   Collection Time    03/19/13  5:01 PM      Result Value Range   Preg Test, Ur NEGATIVE  NEGATIVE   Comment:            THE SENSITIVITY OF THIS     METHODOLOGY IS >24 mIU/mL  CBC WITH DIFFERENTIAL     Status: Abnormal   Collection Time    03/19/13  6:36 PM      Result Value Range   WBC 10.7 (*) 4.0 - 10.5 K/uL   RBC 4.51  3.87 - 5.11 MIL/uL   Hemoglobin 10.8 (*) 12.0 - 15.0 g/dL   HCT 96.0 (*) 45.4 - 09.8 %   MCV 77.4 (*) 78.0 - 100.0 fL   MCH 23.9 (*) 26.0 - 34.0 pg   MCHC 30.9  30.0 - 36.0 g/dL  RDW 17.2 (*) 11.5 - 15.5 %   Platelets 264  150 - 400 K/uL   Neutrophils Relative % 82 (*) 43 - 77 %   Neutro Abs 8.8 (*) 1.7 - 7.7 K/uL   Lymphocytes Relative 11 (*) 12 - 46 %   Lymphs Abs 1.2  0.7 - 4.0 K/uL   Monocytes Relative 6  3 - 12 %   Monocytes Absolute 0.7  0.1 - 1.0 K/uL   Eosinophils Relative 1  0 - 5 %   Eosinophils Absolute 0.1  0.0 - 0.7 K/uL   Basophils Relative 0  0 - 1 %   Basophils Absolute 0.0  0.0 - 0.1 K/uL  BASIC METABOLIC PANEL     Status: Abnormal   Collection Time    03/19/13  6:36 PM      Result Value Range   Sodium 137  135 - 145 mEq/L   Potassium 2.8 (*) 3.5 - 5.1 mEq/L   Chloride 103  96 - 112 mEq/L   CO2 16 (*) 19 - 32 mEq/L   Glucose, Bld 87  70 - 99 mg/dL   BUN 30 (*) 6 - 23 mg/dL   Creatinine, Ser 4.09 (*) 0.50 - 1.10 mg/dL   Calcium 8.3 (*) 8.4 - 10.5 mg/dL   GFR calc non Af Amer 25 (*) >90 mL/min   GFR calc Af Amer 28 (*) >90 mL/min   Comment:            The eGFR has been calculated     using the CKD EPI equation.     This calculation has not been     validated in all clinical     situations.     eGFR's persistently     <90 mL/min signify     possible  Chronic Kidney Disease.  TSH     Status: None   Collection Time    03/20/13 12:31 AM      Result Value Range   TSH 2.210  0.350 - 4.500 uIU/mL  HEPATIC FUNCTION PANEL     Status: None   Collection Time    03/20/13  1:01 AM      Result Value Range   Total Protein 7.8  6.0 - 8.3 g/dL   Albumin 3.8  3.5 - 5.2 g/dL   AST 18  0 - 37 U/L   ALT 18  0 - 35 U/L   Alkaline Phosphatase 96  39 - 117 U/L   Total Bilirubin 0.3  0.3 - 1.2 mg/dL   Bilirubin, Direct <8.1  0.0 - 0.3 mg/dL   Indirect Bilirubin NOT CALCULATED  0.3 - 0.9 mg/dL  COMPREHENSIVE METABOLIC PANEL     Status: Abnormal   Collection Time    03/20/13  5:21 AM      Result Value Range   Sodium 142  135 - 145 mEq/L   Potassium 3.2 (*) 3.5 - 5.1 mEq/L   Chloride 111  96 - 112 mEq/L   CO2 16 (*) 19 - 32 mEq/L   Glucose, Bld 82  70 - 99 mg/dL   BUN 30 (*) 6 - 23 mg/dL   Creatinine, Ser 1.91 (*) 0.50 - 1.10 mg/dL   Calcium 7.7 (*) 8.4 - 10.5 mg/dL   Total Protein 6.9  6.0 - 8.3 g/dL   Albumin 3.3 (*) 3.5 - 5.2 g/dL   AST 14  0 - 37 U/L   ALT 15  0 - 35 U/L   Alkaline Phosphatase 88  39 - 117 U/L   Total Bilirubin 0.3  0.3 - 1.2 mg/dL   GFR calc non Af Amer 26 (*) >90 mL/min   GFR calc Af Amer 31 (*) >90 mL/min   Comment:            The eGFR has been calculated     using the CKD EPI equation.     This calculation has not been     validated in all clinical     situations.     eGFR's persistently     <90 mL/min signify     possible Chronic Kidney Disease.  CBC     Status: Abnormal   Collection Time    03/20/13  5:21 AM      Result Value Range   WBC 8.2  4.0 - 10.5 K/uL   RBC 4.07  3.87 - 5.11 MIL/uL   Hemoglobin 9.9 (*) 12.0 - 15.0 g/dL   HCT 16.1 (*) 09.6 - 04.5 %   MCV 78.4  78.0 - 100.0 fL   MCH 24.3 (*) 26.0 - 34.0 pg   MCHC 31.0  30.0 - 36.0 g/dL   RDW 40.9 (*) 81.1 - 91.4 %   Platelets 225  150 - 400 K/uL  VITAMIN B12     Status: None   Collection Time    03/20/13  5:21 AM      Result Value Range    Vitamin B-12 344  211 - 911 pg/mL  FOLATE     Status: None   Collection Time    03/20/13  5:21 AM      Result Value Range   Folate >20.0     Comment: (NOTE)     Reference Ranges            Deficient:       0.4 - 3.3 ng/mL            Indeterminate:   3.4 - 5.4 ng/mL            Normal:              > 5.4 ng/mL  IRON AND TIBC     Status: Abnormal   Collection Time    03/20/13  5:21 AM      Result Value Range   Iron 28 (*) 42 - 135 ug/dL   TIBC 782  956 - 213 ug/dL   Saturation Ratios 9 (*) 20 - 55 %   UIBC 298  125 - 400 ug/dL  FERRITIN     Status: None   Collection Time    03/20/13  5:21 AM      Result Value Range   Ferritin 26  10 - 291 ng/mL  RETICULOCYTES     Status: None   Collection Time    03/20/13  5:21 AM      Result Value Range   Retic Ct Pct 2.3  0.4 - 3.1 %   RBC. 4.07  3.87 - 5.11 MIL/uL   Retic Count, Manual 93.6  19.0 - 186.0 K/uL    Ct Abdomen Pelvis Wo Contrast  03/19/2013   *RADIOLOGY REPORT*  Clinical Data: Right flank pain for 2 weeks, vomiting, history of kidney stones  CT ABDOMEN AND PELVIS WITHOUT CONTRAST  Technique:  Multidetector CT imaging of the abdomen and pelvis was performed following the standard protocol without intravenous contrast.  Comparison: None.  Findings: The lung bases are clear.  The liver is unremarkable in the unenhanced state.  No  calcified gallstones are seen.  The pancreas is normal in size.  The adrenal glands and spleen are unremarkable.  The stomach is moderately fluid distended with no abnormality noted.  The left kidney is markedly atrophic and it does contain multiple calcifications.  The right kidney appears compensatorily hypertrophied.  There are multiple calcifications within the medullary portions of the right kidney consistent with medullary sponge kidney.   The largest calculus is in the lower pole collecting system of the right kidney measuring 18 mm in diameter. However, there is moderate hydronephrosis present with  hydroureter. The point of obstruction is within the mid pelvis and there appear to be at least three calculi stacked upon each other of approximately 5, 6, and 7 mm in diameter respectively.  The more distal right ureter is normal in caliber.  The left ureter is small to the urinary bladder.  The urinary bladder is decompressed.  The uterus is anteverted.  No fluid is seen within the pelvis.  The colon is decompressed and the terminal ileum is unremarkable.  There are three midline anterior abdominal wall hernias present. These hernias contain fat and nondistended loops of bowel. Minimally prominent groin lymph nodes are noted bilaterally.  IMPRESSION:  1.  Moderate right hydronephrosis and hydroureter to a point of partial obstruction by distal right ureteral calculi stacked upon each other of5, 6 and 7 mm in diameter respectively. 2.  Multiple right renal calculi consistent with medullary sponge kidney, and the right kidney is compensatorily hypertrophied. 3.  Atrophic left kidney with multiple calcifications as well.   Original Report Authenticated By: Dwyane Dee, M.D.   Dg Abd 1 View  03/19/2013   *RADIOLOGY REPORT*  Clinical Data: Flank pain.  Known renal stones.  ABDOMEN - 1 VIEW  Comparison: CT 03/19/2013  Findings: Multiple right renal calcifications are again noted as seen on CT.  Calcifications in the right side of the pelvis likely reflect the ureteral stones seen by CT.  No real change since prior study.  Moderate stool burden in the right side of the colon.  No evidence of bowel obstruction.  No free air organomegaly.  IMPRESSION: Stable right renal calcifications and mid to distal right ureteral stones.   Original Report Authenticated By: Charlett Nose, M.D.    Review of Systems  Respiratory: Negative for shortness of breath.   Cardiovascular: Negative for leg swelling.  Gastrointestinal: Negative for nausea, vomiting and abdominal pain.  Genitourinary: Positive for urgency, frequency and flank  pain. Negative for hematuria.  Neurological: Positive for weakness.   Blood pressure 96/41, pulse 73, temperature 98.2 F (36.8 C), temperature source Oral, resp. rate 20, height 4\' 11"  (1.499 m), weight 104.282 kg (229 lb 14.4 oz), last menstrual period 03/17/2013, SpO2 98.00%. Physical Exam  Constitutional: No distress.  Eyes: No scleral icterus.  Neck: JVD present.  Cardiovascular: Normal rate and regular rhythm.   No murmur heard. Respiratory: No respiratory distress. She has no wheezes.  GI: There is no tenderness. There is no rebound.  Musculoskeletal: She exhibits no tenderness.    Assessment/Plan: Problem #1 possible type I RTA presently patient with low CO2 and hypokalemia. Her potassium has improved and patient is on sodium bicarbonate and potassium supplement. 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 Her BUN creatinine is high most likely the obstructive uropathy may be contributing. Presently there is a slight improvement. Problem #4 anemia Problem #5 right nephromegaly and medullary on kidney  Problem #6 hypokalemia. Plan: DC normal saline with sodium bicarbonate DC potassium chloride Start patient on potassium citrate 20 mEq by mouth 3 times a day Check her basic metabolic panel in the morning. We'll check urine for potassium, chloride, sodium. We'll check her UA We'll change IV fluid to normal saline at 125 cc per hour.  Chelsea [name] S 03/20/2013, 5:35 PM

## 2013-03-20 NOTE — Progress Notes (Signed)
UR Chart Review Completed  

## 2013-03-20 NOTE — Progress Notes (Signed)
Subjective: The patient says that she still has right-sided flank pain and nausea, but pain medication and nausea medication are helping. No vomiting.  Objective: Vital signs in last 24 hours: Filed Vitals:   03/19/13 1643 03/19/13 2206 03/19/13 2237 03/20/13 0422  BP: 104/50 104/42 107/75 96/43  Pulse: 80  73 63  Temp: 98.9 F (37.2 C) 98.6 F (37 C) 97.8 F (36.6 C) 97.8 F (36.6 C)  TempSrc: Oral Oral Oral Oral  Resp: 19 20  19   Height: 4\' 11"  (1.499 m)  4\' 11"  (1.499 m)   Weight: 95.255 kg (210 lb)  104.282 kg (229 lb 14.4 oz)   SpO2: 100% 100% 98% 98%   No intake or output data in the 24 hours ending 03/20/13 1029  Weight change:   Physical exam: General: Alert obese 22 year old Caucasian woman laying in bed, in no acute distress. Lungs: Clear to auscultation bilaterally. Heart: S1, S2, with no murmurs rubs gallops. Abdomen: Morbidly obese, positive bowel sounds, soft, mild tenderness over the right flank and right lower quadrant, no rebound, no guarding, no masses palpated. Extremities: No pedal edema. Neurologic: She is alert and oriented x3.  Lab Results: Basic Metabolic Panel:  Recent Labs  40/98/11 1836 03/20/13 0521  NA 137 142  K 2.8* 3.2*  CL 103 111  CO2 16* 16*  GLUCOSE 87 82  BUN 30* 30*  CREATININE 2.67* 2.52*  CALCIUM 8.3* 7.7*   Liver Function Tests:  Recent Labs  03/20/13 0101 03/20/13 0521  AST 18 14  ALT 18 15  ALKPHOS 96 88  BILITOT 0.3 0.3  PROT 7.8 6.9  ALBUMIN 3.8 3.3*   No results found for this basename: LIPASE, AMYLASE,  in the last 72 hours No results found for this basename: AMMONIA,  in the last 72 hours CBC:  Recent Labs  03/19/13 1836 03/20/13 0521  WBC 10.7* 8.2  NEUTROABS 8.8*  --   HGB 10.8* 9.9*  HCT 34.9* 31.9*  MCV 77.4* 78.4  PLT 264 225   Cardiac Enzymes: No results found for this basename: CKTOTAL, CKMB, CKMBINDEX, TROPONINI,  in the last 72 hours BNP: No results found for this basename: PROBNP,   in the last 72 hours D-Dimer: No results found for this basename: DDIMER,  in the last 72 hours CBG: No results found for this basename: GLUCAP,  in the last 72 hours Hemoglobin A1C: No results found for this basename: HGBA1C,  in the last 72 hours Fasting Lipid Panel: No results found for this basename: CHOL, HDL, LDLCALC, TRIG, CHOLHDL, LDLDIRECT,  in the last 72 hours Thyroid Function Tests: No results found for this basename: TSH, T4TOTAL, FREET4, T3FREE, THYROIDAB,  in the last 72 hours Anemia Panel:  Recent Labs  03/20/13 0521  RETICCTPCT 2.3   Coagulation: No results found for this basename: LABPROT, INR,  in the last 72 hours Urine Drug Screen: Drugs of Abuse  No results found for this basename: labopia,  cocainscrnur,  labbenz,  amphetmu,  thcu,  labbarb    Alcohol Level: No results found for this basename: ETH,  in the last 72 hours Urinalysis:  Recent Labs  03/19/13 1659  COLORURINE YELLOW  LABSPEC <1.005*  PHURINE 7.0  GLUCOSEU NEGATIVE  HGBUR LARGE*  BILIRUBINUR NEGATIVE  KETONESUR NEGATIVE  PROTEINUR TRACE*  UROBILINOGEN 0.2  NITRITE NEGATIVE  LEUKOCYTESUR LARGE*   Misc. Labs:   Micro: No results found for this or any previous visit (from the past 240 hour(s)).  Studies/Results: Ct Abdomen  Pelvis Wo Contrast  03/19/2013   *RADIOLOGY REPORT*  Clinical Data: Right flank pain for 2 weeks, vomiting, history of kidney stones  CT ABDOMEN AND PELVIS WITHOUT CONTRAST  Technique:  Multidetector CT imaging of the abdomen and pelvis was performed following the standard protocol without intravenous contrast.  Comparison: None.  Findings: The lung bases are clear.  The liver is unremarkable in the unenhanced state.  No calcified gallstones are seen.  The pancreas is normal in size.  The adrenal glands and spleen are unremarkable.  The stomach is moderately fluid distended with no abnormality noted.  The left kidney is markedly atrophic and it does contain multiple  calcifications.  The right kidney appears compensatorily hypertrophied.  There are multiple calcifications within the medullary portions of the right kidney consistent with medullary sponge kidney.   The largest calculus is in the lower pole collecting system of the right kidney measuring 18 mm in diameter. However, there is moderate hydronephrosis present with hydroureter. The point of obstruction is within the mid pelvis and there appear to be at least three calculi stacked upon each other of approximately 5, 6, and 7 mm in diameter respectively.  The more distal right ureter is normal in caliber.  The left ureter is small to the urinary bladder.  The urinary bladder is decompressed.  The uterus is anteverted.  No fluid is seen within the pelvis.  The colon is decompressed and the terminal ileum is unremarkable.  There are three midline anterior abdominal wall hernias present. These hernias contain fat and nondistended loops of bowel. Minimally prominent groin lymph nodes are noted bilaterally.  IMPRESSION:  1.  Moderate right hydronephrosis and hydroureter to a point of partial obstruction by distal right ureteral calculi stacked upon each other of5, 6 and 7 mm in diameter respectively. 2.  Multiple right renal calculi consistent with medullary sponge kidney, and the right kidney is compensatorily hypertrophied. 3.  Atrophic left kidney with multiple calcifications as well.   Original Report Authenticated By: Dwyane Dee, M.D.   Dg Abd 1 View  03/19/2013   *RADIOLOGY REPORT*  Clinical Data: Flank pain.  Known renal stones.  ABDOMEN - 1 VIEW  Comparison: CT 03/19/2013  Findings: Multiple right renal calcifications are again noted as seen on CT.  Calcifications in the right side of the pelvis likely reflect the ureteral stones seen by CT.  No real change since prior study.  Moderate stool burden in the right side of the colon.  No evidence of bowel obstruction.  No free air organomegaly.  IMPRESSION: Stable right  renal calcifications and mid to distal right ureteral stones.   Original Report Authenticated By: Charlett Nose, M.D.    Medications:  Scheduled: . cefTRIAXone (ROCEPHIN)  IV  1 g Intravenous Q24H  . citalopram  20 mg Oral Q2200   Continuous: . sodium chloride 0.45 % 1,000 mL with potassium chloride 40 mEq, sodium bicarbonate 50 mEq infusion 125 mL/hr at 03/20/13 0857   ZOX:[id number], acetaminophen, alum & mag hydroxide-simeth, HYDROmorphone (DILAUDID) injection, ondansetron (ZOFRAN) IV, ondansetron, promethazine  Assessment: Principal Problem:   ARF (acute renal failure) Active Problems:   Ureteral stone with hydronephrosis   UTI (urinary tract infection)   History of rickets   RTA (renal tubular acidosis)   Microcytic anemia   Hypokalemia   1. Ureterolithiasis with hydronephrosis. Continue IV fluid hydration and pain management. Urology consult and recommendations pending.  Acute renal failure. Likely secondary to prerenal azotemia and possibly a contribution from obstructive  uropathy. We'll continue IV fluid hydration and follow her renal function.   Metabolic acidosis. Query RTA. IV fluids have been changed to add bicarbonate.  Urinary tract infection. We'll continue IV Rocephin. Urine culture pending.  Hypokalemia. Potassium added to IV fluids this morning. As her nausea subsides, we'll try oral potassium. We'll check a magnesium level to rule out deficiency.  Microcytic anemia. Likely multifactorial including some blood loss in the urine from kidney stones, hemodilution, acute renal failure, and history of menstruation. Will monitor.   Plan:  1. Urology consult pending. 2. IV fluids changed this morning to add bicarbonate and potassium. Consider decreasing potassium in the IV fluids tomorrow. 3. Start small doses of oral potassium if she can tolerate it. 4. Check urine culture pending.    LOS: 1 day   Suanne Minahan 03/20/2013, 10:29 AM

## 2013-03-21 ENCOUNTER — Encounter (HOSPITAL_COMMUNITY)
Admission: EM | Disposition: A | Discharge status: Home / Self Care | Payer: Self-pay | Source: Home / Self Care | Attending: Family Medicine

## 2013-03-21 ENCOUNTER — Encounter (HOSPITAL_COMMUNITY): Payer: Self-pay | Admitting: *Deleted

## 2013-03-21 ENCOUNTER — Inpatient Hospital Stay (HOSPITAL_COMMUNITY): Payer: Self-pay

## 2013-03-21 HISTORY — PX: EXTRACORPOREAL SHOCK WAVE LITHOTRIPSY: SHX1557

## 2013-03-21 LAB — SURGICAL PCR SCREEN: MRSA, PCR: NEGATIVE

## 2013-03-21 LAB — CBC
HCT: 32.3 % — ABNORMAL LOW (ref 36.0–46.0)
MCV: 79.4 fL (ref 78.0–100.0)
RBC: 4.07 MIL/uL (ref 3.87–5.11)
RDW: 17.7 % — ABNORMAL HIGH (ref 11.5–15.5)
WBC: 7 10*3/uL (ref 4.0–10.5)

## 2013-03-21 LAB — BASIC METABOLIC PANEL
CO2: 15 mEq/L — ABNORMAL LOW (ref 19–32)
Chloride: 116 mEq/L — ABNORMAL HIGH (ref 96–112)
Creatinine, Ser: 2.35 mg/dL — ABNORMAL HIGH (ref 0.50–1.10)
GFR calc Af Amer: 33 mL/min — ABNORMAL LOW (ref >90)
Potassium: 3.4 mEq/L — ABNORMAL LOW (ref 3.5–5.1)
Sodium: 143 mEq/L (ref 135–145)

## 2013-03-21 LAB — URINE CULTURE: Culture: NO GROWTH

## 2013-03-21 SURGERY — LITHOTRIPSY, ESWL
Anesthesia: Moderate Sedation | Laterality: Right

## 2013-03-21 MED ORDER — POTASSIUM CHLORIDE CRYS ER 20 MEQ PO TBCR
40.0000 meq | EXTENDED_RELEASE_TABLET | Freq: Once | ORAL | Status: AC
  Administered 2013-03-21: 40 meq via ORAL
  Filled 2013-03-21: qty 2

## 2013-03-21 MED ORDER — DIPHENHYDRAMINE HCL 25 MG PO TABS
25.0000 mg | ORAL_TABLET | Freq: Once | ORAL | Status: AC
  Administered 2013-03-21: 25 mg via ORAL

## 2013-03-21 MED ORDER — DIAZEPAM 5 MG PO TABS
10.0000 mg | ORAL_TABLET | Freq: Once | ORAL | Status: AC
  Administered 2013-03-21: 10 mg via ORAL

## 2013-03-21 MED ORDER — DIPHENHYDRAMINE HCL 25 MG PO CAPS
ORAL_CAPSULE | ORAL | Status: AC
  Filled 2013-03-21: qty 1

## 2013-03-21 MED ORDER — DIAZEPAM 5 MG PO TABS
ORAL_TABLET | ORAL | Status: AC
  Filled 2013-03-21: qty 2

## 2013-03-21 SURGICAL SUPPLY — 4 items
CLOTH BEACON ORANGE TIMEOUT ST (SAFETY) IMPLANT
GOWN STRL REIN XL XLG (GOWN DISPOSABLE) IMPLANT
SET IRRIGATING DISP (SET/KITS/TRAYS/PACK) IMPLANT
TOWEL OR 17X26 4PK STRL BLUE (TOWEL DISPOSABLE) IMPLANT

## 2013-03-21 NOTE — Progress Notes (Signed)
Patient seen, independently examined and chart reviewed. I agree with exam, assessment and plan discussed with Toya Smothers, NP.  Patient is seen status post lithotripsy.  Objective: Afebrile, vitals stable. Appears calm and comfortable.  Labs/studies: Modest improvement in BUN and creatinine. Hemoglobin stable.  Assessment:  Acute renal failure  Metabolic acidosis  Nephrolithiasis, ureteral stone with hydronephrosis  Solitary functioning kidney  Ongoing menses  Microcytic anemia  Plan:  Continue IV fluids, repeat basic metabolic panel in the morning. Anticipate discharge home 6/26 renal function improved. Continue empiric antibiotics  PMH includes:  Renal tubular acidosis type I, rickets, nephrolithiasis, partial colectomy secondary to obstruction  Brendia Sacks, MD [facility] [phone]

## 2013-03-21 NOTE — Progress Notes (Signed)
i have treated her distal r ureteral calculus she may be discharged any time i will see her in office in one week with a kub she can be discharged on percocet for pain . Please check with nephrogist if it is ok to discharge her

## 2013-03-21 NOTE — Progress Notes (Signed)
[facility] PROGRESS NOTE  Chelsea [name] ZOX:[id number] DOB: 1990-10-20 DOA: 03/19/2013 PCP: No PCP Per Patient  Assessment/Plan: Ureterolithiasis with hydronephrosis. Continue IV fluid hydration and pain management. Urology consult with lithotripsy today.   Acute renal failure. Secondary to hydronephrosis related to distal right ureteral calculi with obstruction. Trending downward slightly. Continue IV fluid hydration and follow her renal function. Appreciate nephrology assistance  Metabolic acidosis. Likely related to above. Stable. Bicarbonate discontinued 03/20/13 per nephrology. Continue NS IV. Monitor.  Urinary tract infection. We'll continue IV Rocephin. Urine culture no growth to date.   Hypokalemia. Improved with IV repletion. Remains slightly low. No further nausea. Will replete orally.  We'll check a magnesium level to rule out deficiency.   Microcytic anemia. Likely multifactorial including some blood loss in the urine from kidney stones, hemodilution, acute renal failure, and history of menstruation. Hg stable. No s/sx active bleeding. Continue to monitor.    1. *  Code Status: full Family Communication: husband at bedside Disposition Plan: home when ready   Consultants:  Urology  nephrology  Procedures:    Antibiotics:  Rocephin 03/20/13>>>  HPI/Subjective: Reports continued mild flank pain. Affect flat. Resists eye contact.   Objective: Filed Vitals:   03/20/13 0422 03/20/13 1413 03/20/13 2139 03/21/13 0506  BP: 96/43 96/41 93/65  103/69  Pulse: 63 73 71 67  Temp: 97.8 F (36.6 C) 98.2 F (36.8 C) 98 F (36.7 C) 97.9 F (36.6 C)  TempSrc: Oral Oral Oral Oral  Resp: 19 20 20 20   Height:      Weight:      SpO2: 98% 98% 97% 98%    Intake/Output Summary (Last 24 hours) at 03/21/13 1040 Last data filed at 03/20/13 2300  Gross per 24 hour  Intake 2566.67 ml  Output    150 ml  Net 2416.67 ml   Filed Weights   03/19/13 1643 03/19/13  2237  Weight: 95.255 kg (210 lb) 104.282 kg (229 lb 14.4 oz)    Exam:   General:  Obese NAD  Cardiovascular: RRR no MGR no LE edema PPP  Respiratory: normal effort BS clear bilaterally no wheeze no rhonchi  Abdomen: obese, soft +BS mild tenderness in RLQ and right lower back. No guarding   Musculoskeletal: no clubbing no cyanosis   Data Reviewed: Basic Metabolic Panel:  Recent Labs Lab 03/19/13 1836 03/20/13 0521 03/20/13 1811 03/21/13 0529  NA 137 142 140 143  K 2.8* 3.2* 3.2* 3.4*  CL 103 111 109 116*  CO2 16* 16* 16* 15*  GLUCOSE 87 82 100* 97  BUN 30* 30* 29* 28*  CREATININE 2.67* 2.52* 2.55* 2.35*  CALCIUM 8.3* 7.7* 7.7* 8.0*   Liver Function Tests:  Recent Labs Lab 03/20/13 0101 03/20/13 0521  AST 18 14  ALT 18 15  ALKPHOS 96 88  BILITOT 0.3 0.3  PROT 7.8 6.9  ALBUMIN 3.8 3.3*   No results found for this basename: LIPASE, AMYLASE,  in the last 168 hours No results found for this basename: AMMONIA,  in the last 168 hours CBC:  Recent Labs Lab 03/19/13 1836 03/20/13 0521 03/21/13 0529  WBC 10.7* 8.2 7.0  NEUTROABS 8.8*  --   --   HGB 10.8* 9.9* 9.8*  HCT 34.9* 31.9* 32.3*  MCV 77.4* 78.4 79.4  PLT 264 225 235   Cardiac Enzymes: No results found for this basename: CKTOTAL, CKMB, CKMBINDEX, TROPONINI,  in the last 168 hours BNP (last 3 results) No results found for this basename: PROBNP,  in  the last 8760 hours CBG: No results found for this basename: GLUCAP,  in the last 168 hours  Recent Results (from the past 240 hour(s))  URINE CULTURE     Status: None   Collection Time    03/19/13  4:59 PM      Result Value Range Status   Specimen Description URINE, CLEAN CATCH   Final   Special Requests NONE   Final   Culture  Setup Time 03/20/2013 03:46   Final   Colony Count NO GROWTH   Final   Culture NO GROWTH   Final   Report Status 03/21/2013 FINAL   Final  SURGICAL PCR SCREEN     Status: None   Collection Time    03/21/13  1:39 AM       Result Value Range Status   MRSA, PCR NEGATIVE  NEGATIVE Final   Staphylococcus aureus NEGATIVE  NEGATIVE Final   Comment:            The Xpert SA Assay (FDA     approved for NASAL specimens     in patients over 24 years of age),     is one component of     a comprehensive surveillance     program.  Test performance has     been validated by [employer] for patients greater     than or equal to 23 year old.     It is not intended     to diagnose infection nor to     guide or monitor treatment.     Studies: Ct Abdomen Pelvis Wo Contrast  03/19/2013   *RADIOLOGY REPORT*  Clinical Data: Right flank pain for 2 weeks, vomiting, history of kidney stones  CT ABDOMEN AND PELVIS WITHOUT CONTRAST  Technique:  Multidetector CT imaging of the abdomen and pelvis was performed following the standard protocol without intravenous contrast.  Comparison: None.  Findings: The lung bases are clear.  The liver is unremarkable in the unenhanced state.  No calcified gallstones are seen.  The pancreas is normal in size.  The adrenal glands and spleen are unremarkable.  The stomach is moderately fluid distended with no abnormality noted.  The left kidney is markedly atrophic and it does contain multiple calcifications.  The right kidney appears compensatorily hypertrophied.  There are multiple calcifications within the medullary portions of the right kidney consistent with medullary sponge kidney.   The largest calculus is in the lower pole collecting system of the right kidney measuring 18 mm in diameter. However, there is moderate hydronephrosis present with hydroureter. The point of obstruction is within the mid pelvis and there appear to be at least three calculi stacked upon each other of approximately 5, 6, and 7 mm in diameter respectively.  The more distal right ureter is normal in caliber.  The left ureter is small to the urinary bladder.  The urinary bladder is decompressed.  The uterus is anteverted.  No  fluid is seen within the pelvis.  The colon is decompressed and the terminal ileum is unremarkable.  There are three midline anterior abdominal wall hernias present. These hernias contain fat and nondistended loops of bowel. Minimally prominent groin lymph nodes are noted bilaterally.  IMPRESSION:  1.  Moderate right hydronephrosis and hydroureter to a point of partial obstruction by distal right ureteral calculi stacked upon each other of5, 6 and 7 mm in diameter respectively. 2.  Multiple right renal calculi consistent with medullary sponge kidney,  and the right kidney is compensatorily hypertrophied. 3.  Atrophic left kidney with multiple calcifications as well.   Original Report Authenticated By: Dwyane Dee, M.D.   Dg Abd 1 View  03/19/2013   *RADIOLOGY REPORT*  Clinical Data: Flank pain.  Known renal stones.  ABDOMEN - 1 VIEW  Comparison: CT 03/19/2013  Findings: Multiple right renal calcifications are again noted as seen on CT.  Calcifications in the right side of the pelvis likely reflect the ureteral stones seen by CT.  No real change since prior study.  Moderate stool burden in the right side of the colon.  No evidence of bowel obstruction.  No free air organomegaly.  IMPRESSION: Stable right renal calcifications and mid to distal right ureteral stones.   Original Report Authenticated By: Charlett Nose, M.D.    Scheduled Meds: . cefTRIAXone (ROCEPHIN)  IV  1 g Intravenous Q24H  . citalopram  20 mg Oral Q2200  . potassium citrate  20 mEq Oral TID WC   Continuous Infusions: . sodium chloride 125 mL/hr at 03/21/13 0981    Principal Problem:   ARF (acute renal failure) Active Problems:   UTI (urinary tract infection)   Ureteral stone with hydronephrosis   History of rickets   RTA (renal tubular acidosis)   Microcytic anemia   Hypokalemia    Time spent: 30 minutes    [facility] M  [facility] Pager [phone]. If 7PM-7AM, please contact night-coverage at [email or web address],  password [facility] 03/21/2013, 10:40 AM  LOS: 2 days

## 2013-03-21 NOTE — Care Management Note (Signed)
    Page 1 of 1   03/23/2013     10:30:31 AM   CARE MANAGEMENT NOTE 03/23/2013  Patient:  Chelsea [name], Chelsea [name]   Account Number:  [id number]  Date Initiated:  03/21/2013  Documentation initiated by:  Sharrie Rothman  Subjective/Objective Assessment:   Pt admitted from home with kidney stones and hydronephrosis. Pt lives with significant other and will return home at discharge. Pt is independent with ADL's.     Action/Plan:   Pt may need voucher for medication assistance at discharge. Pt in surgery at this time. CM to follow up. Financial counselor aware of self pay status.   Anticipated DC Date:  03/22/2013   Anticipated DC Plan:  HOME/SELF CARE  In-house referral  Financial Counselor      DC Planning Services  CM consult  MATCH Program      Choice offered to / List presented to:             Status of service:  Completed, signed off Medicare Important Message given?   (If response is "NO", the following Medicare IM given date fields will be blank) Date Medicare IM given:   Date Additional Medicare IM given:    Discharge Disposition:  HOME/SELF CARE  Per UR Regulation:    If discussed at Long Length of Stay Meetings, dates discussed:    Comments:  03/23/13 1030 Arlyss Queen, RN BSN CM Pt to be discharged home today. MATCH voucher given for medication assistance. Pt has been instructed to [facility] [place] for PCP follow up. Pt also informed that she can follow up with Dr. Kristian Covey here in [place] or another nephrologist in [place] if she wishes. Pt verbalized understanding. Pt and pts nurse aware of discharge arrangemenets.  03/21/13 1455 Arlyss Queen, RN BSN CM

## 2013-03-21 NOTE — Progress Notes (Signed)
Subjective: Interval History: has complaints flank pain. She'll have any nausea vomiting. She denies also any difficulty in breathing..  Objective: Vital signs in last 24 hours: Temp:  [97.9 F (36.6 C)-98.2 F (36.8 C)] 97.9 F (36.6 C) (06/25 0506) Pulse Rate:  [67-73] 67 (06/25 0506) Resp:  [20] 20 (06/25 0506) BP: (93-103)/(41-69) 103/69 mmHg (06/25 0506) SpO2:  [97 %-98 %] 98 % (06/25 0506) Weight change:   Intake/Output from previous day: 06/24 0701 - 06/25 0700 In: 2566.7 [P.O.:1200; I.V.:1316.7; IV Piggyback:50] Out: 150 [Urine:150] Intake/Output this shift:    General appearance: alert, cooperative and no distress Resp: clear to auscultation bilaterally Cardio: regular rate and rhythm, S1, S2 normal, no murmur, click, rub or gallop GI: Tenderness and positive bowel sound Extremities: extremities normal, atraumatic, no cyanosis or edema  Lab Results:  Recent Labs  03/20/13 0521 03/21/13 0529  WBC 8.2 7.0  HGB 9.9* 9.8*  HCT 31.9* 32.3*  PLT 225 235   BMET:  Recent Labs  03/20/13 1811 03/21/13 0529  NA 140 143  K 3.2* 3.4*  CL 109 116*  CO2 16* 15*  GLUCOSE 100* 97  BUN 29* 28*  CREATININE 2.55* 2.35*  CALCIUM 7.7* 8.0*   No results found for this basename: PTH,  in the last 72 hours Iron Studies:  Recent Labs  03/20/13 0521  IRON 28*  TIBC 326  FERRITIN 26    Studies/Results: Ct Abdomen Pelvis Wo Contrast  03/19/2013   *RADIOLOGY REPORT*  Clinical Data: Right flank pain for 2 weeks, vomiting, history of kidney stones  CT ABDOMEN AND PELVIS WITHOUT CONTRAST  Technique:  Multidetector CT imaging of the abdomen and pelvis was performed following the standard protocol without intravenous contrast.  Comparison: None.  Findings: The lung bases are clear.  The liver is unremarkable in the unenhanced state.  No calcified gallstones are seen.  The pancreas is normal in size.  The adrenal glands and spleen are unremarkable.  The stomach is moderately  fluid distended with no abnormality noted.  The left kidney is markedly atrophic and it does contain multiple calcifications.  The right kidney appears compensatorily hypertrophied.  There are multiple calcifications within the medullary portions of the right kidney consistent with medullary sponge kidney.   The largest calculus is in the lower pole collecting system of the right kidney measuring 18 mm in diameter. However, there is moderate hydronephrosis present with hydroureter. The point of obstruction is within the mid pelvis and there appear to be at least three calculi stacked upon each other of approximately 5, 6, and 7 mm in diameter respectively.  The more distal right ureter is normal in caliber.  The left ureter is small to the urinary bladder.  The urinary bladder is decompressed.  The uterus is anteverted.  No fluid is seen within the pelvis.  The colon is decompressed and the terminal ileum is unremarkable.  There are three midline anterior abdominal wall hernias present. These hernias contain fat and nondistended loops of bowel. Minimally prominent groin lymph nodes are noted bilaterally.  IMPRESSION:  1.  Moderate right hydronephrosis and hydroureter to a point of partial obstruction by distal right ureteral calculi stacked upon each other of5, 6 and 7 mm in diameter respectively. 2.  Multiple right renal calculi consistent with medullary sponge kidney, and the right kidney is compensatorily hypertrophied. 3.  Atrophic left kidney with multiple calcifications as well.   Original Report Authenticated By: Dwyane Dee, M.D.   Dg Abd 1 View  03/19/2013   *RADIOLOGY REPORT*  Clinical Data: Flank pain.  Known renal stones.  ABDOMEN - 1 VIEW  Comparison: CT 03/19/2013  Findings: Multiple right renal calcifications are again noted as seen on CT.  Calcifications in the right side of the pelvis likely reflect the ureteral stones seen by CT.  No real change since prior study.  Moderate stool burden in the  right side of the colon.  No evidence of bowel obstruction.  No free air organomegaly.  IMPRESSION: Stable right renal calcifications and mid to distal right ureteral stones.   Original Report Authenticated By: Charlett Nose, M.D.    I have reviewed the patient's current medications.  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 Patient urine pH is 7 which is high in presence of very low CO2. Patient also with hypokalemia. Presently her metabolic acidosis seems to be stable. Problem #2 hypokalemia secondary to the above. Patient on potassium citrate her potassium is improving but still remains low. Problem #3 acute kidney injury supposed to chronic. Possibly obstructive. 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 However other etiologies has this moment cannot ruled out. Problem #6 history of kidney stone with hydro-. Plan: Continue his present management We'll check her basic metabolic panel in the morning.    LOS: 2 days   Madyx Delfin S 03/21/2013,8:15 AM

## 2013-03-21 NOTE — Progress Notes (Signed)
No change in H&P on reexamination. 

## 2013-03-22 LAB — BASIC METABOLIC PANEL
BUN: 18 mg/dL (ref 6–23)
CO2: 19 mEq/L (ref 19–32)
Calcium: 7.5 mg/dL — ABNORMAL LOW (ref 8.4–10.5)
Creatinine, Ser: 1.63 mg/dL — ABNORMAL HIGH (ref 0.50–1.10)
GFR calc non Af Amer: 45 mL/min — ABNORMAL LOW (ref >90)
Glucose, Bld: 79 mg/dL (ref 70–99)

## 2013-03-22 MED ORDER — POTASSIUM CHLORIDE CRYS ER 20 MEQ PO TBCR
40.0000 meq | EXTENDED_RELEASE_TABLET | Freq: Once | ORAL | Status: AC
  Administered 2013-03-22: 40 meq via ORAL
  Filled 2013-03-22: qty 2

## 2013-03-22 MED ORDER — POTASSIUM CITRATE ER 10 MEQ (1080 MG) PO TBCR
30.0000 meq | EXTENDED_RELEASE_TABLET | Freq: Three times a day (TID) | ORAL | Status: DC
  Administered 2013-03-22 – 2013-03-23 (×3): 30 meq via ORAL
  Filled 2013-03-22 (×13): qty 3

## 2013-03-22 MED ORDER — SODIUM CHLORIDE 0.45 % IV SOLN
INTRAVENOUS | Status: DC
  Administered 2013-03-22 (×2): via INTRAVENOUS
  Filled 2013-03-22 (×3): qty 1000

## 2013-03-22 MED ORDER — HYDROCODONE-ACETAMINOPHEN 5-325 MG PO TABS
2.0000 | ORAL_TABLET | ORAL | Status: DC | PRN
  Administered 2013-03-22 – 2013-03-23 (×6): 2 via ORAL
  Filled 2013-03-22 (×6): qty 2

## 2013-03-22 MED FILL — Fentanyl Citrate Inj 0.05 MG/ML: INTRAMUSCULAR | Qty: 4 | Status: AC

## 2013-03-22 MED FILL — Midazolam HCl Inj 2 MG/2ML (Base Equivalent): INTRAMUSCULAR | Qty: 8 | Status: AC

## 2013-03-22 NOTE — Progress Notes (Signed)
Patient has boils under left breast. Wants to be tested for MRSA.

## 2013-03-22 NOTE — Progress Notes (Signed)
Patient seen, independently examined and chart reviewed. I agree with exam, assessment and plan discussed with Toya Smothers, NP.  Interval history/Subjective: Overall she feels okay.  Objective: Appears calm and comfortable. Speech fluent and clear. Flat affect. Cardiovascular regular rate and rhythm. Respiratory clear to auscultation bilaterally. Skin under her left breast is slightly macerated. No cellulitis, abscess or signs of infection.  Labs/studies: Potassium 3.0. Creatinine improved. BUN now normal.  Assessment:  Urolithiasis with hydronephrosis  Acute renal failure  Renal tubular acidosis type I  Hypokalemia  Resolved metabolic acidosis  UTI  Plan:  As documented, likely home 6/27. Discussed with nephrology continue potassium supplementation.  Brendia Sacks, MD [facility] [phone]

## 2013-03-22 NOTE — Progress Notes (Signed)
[facility] PROGRESS NOTE  Chelsea [name] ZOX:[id number] DOB: 01/21/1991 DOA: 03/19/2013 PCP: No PCP Per Patient  Assessment/Plan: Ureterolithiasis with hydronephrosis.S/P lithotripsy 03/21/13. Continue IV fluid hydration per nephrology. Continue po pain management.    Acute renal failure. Secondary to hydronephrosis related to distal right ureteral calculi with obstruction. Creatinine continues to trend downward. Continue IV fluid hydration per nephrology and follow her renal function. Appreciate nephrology assistance   Renal tubular acidosis related to above. Nephrology assistance appreciated. Continue potassium citrate per renal.  Metabolic acidosis. Likely related to above.Continues to improve. Bicarbonate discontinued 03/20/13 per nephrology. IV fluids changed to 1/2NS +KCL per renal. Monitor.   Urinary tract infection. We'll continue IV Rocephin. Urine culture no growth to date. Day #4 Rocephin.   Hypokalemia. persistant.  Appreciate renal assistance. Oral supplement dose increased and IV repletion provided as well.  No further nausea. Magnesium level within the limits of normal. Recheck in am.    Microcytic anemia. Likely multifactorial including some blood loss in the urine from kidney stones, hemodilution, acute renal failure, and history of menstruation. Hg stable. No s/sx active bleeding. Continue to monitor   Code Status: full Family Communication:  Disposition Plan: home likely tomorrow.   Consultants:  Nephrology  Procedures:  Lithotripsy 03/21/13  Antibiotics:  Rocephin 03/19/13>>>  HPI/Subjective: Complains back pain. Otherwise no complaints  Objective: Filed Vitals:   03/21/13 1515 03/21/13 1523 03/21/13 2205 03/22/13 0520  BP: 116/56 116/77 98/62 94/63   Pulse:  78 66 67  Temp:  97.5 F (36.4 C) 97.6 F (36.4 C) 98 F (36.7 C)  TempSrc:   Oral Oral  Resp: 17 18 18 18   Height:      Weight:      SpO2: 98% 99% 98% 96%    Intake/Output Summary  (Last 24 hours) at 03/22/13 1121 Last data filed at 03/22/13 0800  Gross per 24 hour  Intake   2725 ml  Output      0 ml  Net   2725 ml   Filed Weights   03/19/13 1643 03/19/13 2237 03/21/13 1219  Weight: 95.255 kg (210 lb) 104.282 kg (229 lb 14.4 oz) 103.874 kg (229 lb)    Exam:   General:  Obese NAD  Cardiovascular: RRR No MGR No LE edema  Respiratory: normal effort BS clear bilaterally  Abdomen: obese soft +BS non-tender to palpation   Musculoskeletal: no clubbing no cyanosis   Data Reviewed: Basic Metabolic Panel:  Recent Labs Lab 03/19/13 1836 03/20/13 0521 03/20/13 1811 03/21/13 0529 03/21/13 1052 03/22/13 0530  NA 137 142 140 143  --  142  K 2.8* 3.2* 3.2* 3.4*  --  3.0*  CL 103 111 109 116*  --  113*  CO2 16* 16* 16* 15*  --  19  GLUCOSE 87 82 100* 97  --  79  BUN 30* 30* 29* 28*  --  18  CREATININE 2.67* 2.52* 2.55* 2.35*  --  1.63*  CALCIUM 8.3* 7.7* 7.7* 8.0*  --  7.5*  MG  --   --   --   --  2.4  --    Liver Function Tests:  Recent Labs Lab 03/20/13 0101 03/20/13 0521  AST 18 14  ALT 18 15  ALKPHOS 96 88  BILITOT 0.3 0.3  PROT 7.8 6.9  ALBUMIN 3.8 3.3*   No results found for this basename: LIPASE, AMYLASE,  in the last 168 hours No results found for this basename: AMMONIA,  in the last 168 hours CBC:  Recent Labs Lab 03/19/13 1836 03/20/13 0521 03/21/13 0529  WBC 10.7* 8.2 7.0  NEUTROABS 8.8*  --   --   HGB 10.8* 9.9* 9.8*  HCT 34.9* 31.9* 32.3*  MCV 77.4* 78.4 79.4  PLT 264 225 235   Cardiac Enzymes: No results found for this basename: CKTOTAL, CKMB, CKMBINDEX, TROPONINI,  in the last 168 hours BNP (last 3 results) No results found for this basename: PROBNP,  in the last 8760 hours CBG: No results found for this basename: GLUCAP,  in the last 168 hours  Recent Results (from the past 240 hour(s))  URINE CULTURE     Status: None   Collection Time    03/19/13  4:59 PM      Result Value Range Status   Specimen Description  URINE, CLEAN CATCH   Final   Special Requests NONE   Final   Culture  Setup Time 03/20/2013 03:46   Final   Colony Count NO GROWTH   Final   Culture NO GROWTH   Final   Report Status 03/21/2013 FINAL   Final  URINE CULTURE     Status: None   Collection Time    03/20/13  7:45 PM      Result Value Range Status   Specimen Description URINE, CLEAN CATCH   Final   Special Requests NONE   Final   Culture  Setup Time 03/20/2013 21:00   Final   Colony Count 7,000 COLONIES/ML   Final   Culture INSIGNIFICANT GROWTH   Final   Report Status 03/21/2013 FINAL   Final  SURGICAL PCR SCREEN     Status: None   Collection Time    03/21/13  1:39 AM      Result Value Range Status   MRSA, PCR NEGATIVE  NEGATIVE Final   Staphylococcus aureus NEGATIVE  NEGATIVE Final   Comment:            The Xpert SA Assay (FDA     approved for NASAL specimens     in patients over 51 years of age),     is one component of     a comprehensive surveillance     program.  Test performance has     been validated by [employer] for patients greater     than or equal to 23 year old.     It is not intended     to diagnose infection nor to     guide or monitor treatment.     Studies: Dg Abd 1 View - Kub  03/21/2013   *RADIOLOGY REPORT*  Clinical Data: Right ureteral calculus.  ABDOMEN - 1 VIEW  Comparison: 03/19/2013 CT and radiographs.  Findings: No interval change of the distal right ureteral calculi overlying the right sacrum.  Calculi in the right renal collecting system also appear unchanged.  Left renal calcifications are also similar.  Largest stone in the right inferior pole measures 21 mm.  IMPRESSION: Unchanged bilateral renal collecting system calculi and a cluster of calculi in the distal right ureter overlying the right sacral ala.   Original Report Authenticated By: Andreas Newport, M.D.    Scheduled Meds: . cefTRIAXone (ROCEPHIN)  IV  1 g Intravenous Q24H  . citalopram  20 mg Oral Q2200  . potassium  chloride  40 mEq Oral Once  . potassium citrate  30 mEq Oral TID WC   Continuous Infusions: . sodium chloride 0.45 % with kcl  Principal Problem:   ARF (acute renal failure) Active Problems:   UTI (urinary tract infection)   Ureteral stone with hydronephrosis   History of rickets   RTA (renal tubular acidosis)   Microcytic anemia   Hypokalemia    Time spent: 30 minutes    [facility] M  [facility] Pager [phone]. If 7PM-7AM, please contact night-coverage at [email or web address], password [facility] 03/22/2013, 11:21 AM  LOS: 3 days

## 2013-03-22 NOTE — Plan of Care (Signed)
Problem: Phase III Progression Outcomes Goal: Discharge plan remains appropriate-arrangements made Outcome: Completed/Met Date Met:  03/22/13 Anticipated discharge home on 03/23/2013.

## 2013-03-22 NOTE — Progress Notes (Signed)
Subjective: Interval History: Patient still complains of flank pain. She has some nausea but no vomiting. She denies also any difficulty in breathing..  Objective: Vital signs in last 24 hours: Temp:  [97.5 F (36.4 C)-98 F (36.7 C)] 98 F (36.7 C) (06/26 0520) Pulse Rate:  [65-86] 67 (06/26 0520) Resp:  [16-28] 18 (06/26 0520) BP: (94-120)/(48-77) 94/63 mmHg (06/26 0520) SpO2:  [95 %-100 %] 96 % (06/26 0520) Weight:  [103.874 kg (229 lb)] 103.874 kg (229 lb) (06/25 1219) Weight change:   Intake/Output from previous day: 06/25 0701 - 06/26 0700 In: 2365 [P.O.:240; I.V.:2125] Out: -  Intake/Output this shift: Total I/O In: 360 [P.O.:360] Out: -   General appearance: alert, cooperative and no distress Resp: clear to auscultation bilaterally Cardio: regular rate and rhythm, S1, S2 normal, no murmur, click, rub or gallop GI: Tenderness and positive bowel sound Extremities: extremities normal, atraumatic, no cyanosis or edema  Lab Results:  Recent Labs  03/20/13 0521 03/21/13 0529  WBC 8.2 7.0  HGB 9.9* 9.8*  HCT 31.9* 32.3*  PLT 225 235   BMET:   Recent Labs  03/21/13 0529 03/22/13 0530  NA 143 142  K 3.4* 3.0*  CL 116* 113*  CO2 15* 19  GLUCOSE 97 79  BUN 28* 18  CREATININE 2.35* 1.63*  CALCIUM 8.0* 7.5*   No results found for this basename: PTH,  in the last 72 hours Iron Studies:   Recent Labs  03/20/13 0521  IRON 28*  TIBC 326  FERRITIN 26    Studies/Results: Dg Abd 1 View - Kub  03/21/2013   *RADIOLOGY REPORT*  Clinical Data: Right ureteral calculus.  ABDOMEN - 1 VIEW  Comparison: 03/19/2013 CT and radiographs.  Findings: No interval change of the distal right ureteral calculi overlying the right sacrum.  Calculi in the right renal collecting system also appear unchanged.  Left renal calcifications are also similar.  Largest stone in the right inferior pole measures 21 mm.  IMPRESSION: Unchanged bilateral renal collecting system calculi and a  cluster of calculi in the distal right ureter overlying the right sacral ala.   Original Report Authenticated By: Andreas Newport, M.D.    I have reviewed the patient's current medications.  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 Presently on pottassuim citrate  Metabolic acidosis has improved Problem #2 hypokalemia secondary to the above. Patient on potassium citrate her potassium is declinuing. Problem #3 acute kidney injury supposed to chronic. Possibly obstructive. 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 However other etiologies has this moment cannot ruled out. Problem #6 history of kidney stone with hydronephrosis Plan: Increase pottassuim  Citrate to 30 meq po tid  We'll check her basic metabolic panel in the morning. Change iv fluid to 1/2 ns with kcl 40 meq iv at 75 cc/hr    LOS: 3 days   Caran Storck S 03/22/2013,10:16 AM

## 2013-03-23 LAB — BASIC METABOLIC PANEL
CO2: 18 mEq/L — ABNORMAL LOW (ref 19–32)
Calcium: 7.6 mg/dL — ABNORMAL LOW (ref 8.4–10.5)
Chloride: 110 mEq/L (ref 96–112)
Creatinine, Ser: 1.63 mg/dL — ABNORMAL HIGH (ref 0.50–1.10)
GFR calc Af Amer: 52 mL/min — ABNORMAL LOW (ref >90)
Sodium: 141 mEq/L (ref 135–145)

## 2013-03-23 LAB — CBC
MCV: 80.1 fL (ref 78.0–100.0)
Platelets: 236 10*3/uL (ref 150–400)
RBC: 4.12 MIL/uL (ref 3.87–5.11)
RDW: 17.8 % — ABNORMAL HIGH (ref 11.5–15.5)
WBC: 7.1 10*3/uL (ref 4.0–10.5)

## 2013-03-23 MED ORDER — POLYETHYLENE GLYCOL 3350 17 G PO PACK
17.0000 g | PACK | Freq: Every day | ORAL | Status: DC
  Administered 2013-03-23: 17 g via ORAL
  Filled 2013-03-23: qty 1

## 2013-03-23 MED ORDER — BISACODYL 10 MG RE SUPP: 10.0000 mg | Freq: Every day | RECTAL | Status: DC | PRN

## 2013-03-23 MED ORDER — POTASSIUM CITRATE ER 10 MEQ (1080 MG) PO TBCR: 30.0000 meq | EXTENDED_RELEASE_TABLET | Freq: Three times a day (TID) | ORAL | Status: DC

## 2013-03-23 MED ORDER — HYDROCODONE-ACETAMINOPHEN 5-325 MG PO TABS: 1.0000 | ORAL_TABLET | Freq: Four times a day (QID) | ORAL | Status: DC | PRN

## 2013-03-23 MED ORDER — FLEET ENEMA 7-19 GM/118ML RE ENEM: 1.0000 | ENEMA | Freq: Every day | RECTAL | Status: DC | PRN

## 2013-03-23 NOTE — Discharge Summary (Signed)
Physician Discharge Summary  Chelsea [name] ZOX:[id number] DOB: [birth year] DOA: 03/19/2013  PCP: No PCP Per Patient  Admit date: 03/19/2013 Discharge date: 03/23/2013  Time spent: 40 minutes  Recommendations for Outpatient Follow-up:  1. Follow up with Dr. Kristian Covey in 4 weeks 2. Follow up with Dr Jerre Simon in 1 week 3. Establish PCP provider and follow up 1 week. Recommend BMET to trend renal function. Discharge Diagnoses:  Principal Problem:   ARF (acute renal failure) Active Problems:   UTI (urinary tract infection)   Ureteral stone with hydronephrosis   History of rickets   RTA (renal tubular acidosis)   Microcytic anemia   Hypokalemia   Discharge Condition: stable  Diet recommendation: regular  Filed Weights   03/19/13 1643 03/19/13 2237 03/21/13 1219  Weight: 95.255 kg (210 lb) 104.282 kg (229 lb 14.4 oz) 103.874 kg (229 lb)    History of present illness:  Chelsea [name] is a 22 y.o. female with a past medical history of rickets disease, renal tubular acidosis, nephrolithiasis, who was in her usual state of health till about a week prior to presentation on 03/19/13, when she started having pain in the right flank area. This was followed by onset of nausea and vomiting. She had vomited innumerable amounts of time. Denied any blood in the emesis. She'd had poor oral intake the previous 3 days. Denied any fever or chills. Denied any diarrhea. She went to the [facility] emergency department about a week prior, where she was told that she has kidney stones, and was treated with antiemetics, and narcotics and was discharged home. However, patient couldn't pass her stones. Her symptoms continued to persist. She had difficulty urinating. She didn't see any frank blood. She denied any dysuria. She'd had decreased amount of urination, along with dribbling urination. Since her symptoms were not improving she decided to come in to the emergency department here.   Hospital Course:   Ureterolithiasis with hydronephrosis.S/P lithotripsy 03/21/13. Pt provided with supportive care and pain medicine. Appreciate Dr. Rudean Haskell assistance. Will need to follow up with Dr. Jerre Simon in 1 week. Continue po pain management at discharge.   Acute renal failure vs chronic kidney disease in setting of  hydronephrosis related to distal right ureteral calculi with obstruction and atrophic right kidney. Creatinine 2.67 on admission. Creatinine stable at 1.6 at discharge. Will follow up with nephrology in 4 weeks. Encouraged to establish PCP in [facility] for chronic treatment and local nephrology referral. Recommend PCP follow up 1 week with BMET to trend creatinine   Renal tubular acidosis related to above. Nephrology assistance appreciated. Continue potassium citrate per renal at discharge.   Metabolic acidosis. Likely related to above. Initially IV fluids with  Bicarbonate that was discontinued 03/20/13 per nephrology. IV fluids changed to 1/2NS +KCL per renal. At discharge resolved. Recommend BMET in 1 week with PCP .   Urinary tract infection. Completed 4 days of  IV Rocephin. Urine culture no growth to date.    Hypokalemia. Mild at discharge. Related to #3. IV repletion provided as well as potassium citrate. Will be discharged with potassium citrate. Recommend BMET 1 week to trend potassium level. Magnesium level within the limits of normal.    Microcytic anemia. Likely multifactorial including some blood loss in the urine from kidney stones, hemodilution, acute renal failure, and history of menstruation. Hg stable during this hospitalization. No s/sx active bleeding.    Procedures:  Lithotripsy 03/21/13  Consultations:  Urology Dr Jerre Simon  Nephrology Dr. Kristian Covey  Discharge Exam: Ceasar Mons Vitals:  03/22/13 0520 03/22/13 1325 03/22/13 2141 03/23/13 0512  BP: 94/63 95/62 102/66 115/72  Pulse: 67 75 62 90  Temp: 98 F (36.7 C) 97.9 F (36.6 C) 98.1 F (36.7 C) 98.1 F (36.7 C)   TempSrc: Oral Oral Oral Oral  Resp: 18 18 18 18   Height:      Weight:      SpO2: 96% 96% 97% 94%    General: obese NAD Cardiovascular: RRR No MGR No LE edema Respiratory: normal effort BS clear bilaterally no wheeze no rhonchi Abdomen obese soft +BS non-tender to palpation  Discharge Instructions  Discharge Orders   Future Orders Complete By Expires     Call MD for:  persistant nausea and vomiting  As directed     Call MD for:  temperature >100.4  As directed     Diet - low sodium heart healthy  As directed     Discharge instructions  As directed     Comments:      Take medications as directed You will need PCP in [place] to trend renal function. Your kidney function is compromised and it is important for you to have PCP for long term care and planning    Increase activity slowly  As directed         Medication List    STOP taking these medications       sulfamethoxazole-trimethoprim 800-160 MG per tablet  Commonly known as:  BACTRIM DS,SEPTRA DS      TAKE these medications       acetaminophen 500 MG tablet  Commonly known as:  TYLENOL  Take 500-1,000 mg by mouth every 6 (six) hours as needed for pain.     citalopram 20 MG tablet  Commonly known as:  CELEXA  Take 20 mg by mouth daily.     HYDROcodone-acetaminophen 5-325 MG per tablet  Commonly known as:  NORCO/VICODIN  Take 1 tablet by mouth every 6 (six) hours as needed for pain.     potassium citrate 10 MEQ (1080 MG) SR tablet  Commonly known as:  UROCIT-K  Take 3 tablets (30 mEq total) by mouth 3 (three) times daily with meals.       No Known Allergies     Follow-up Information   Follow up with [facility], MD On 04/26/2013. (at 11:30 am)    Contact information:   [address] [place] [address] [phone]       Follow up with Alleen Borne I, MD. Schedule an appointment as soon as possible for a visit in 2 weeks. (evaluation of symptoms)    Contact information:   [id number] [place] [address] [phone]       Follow up with [facility], MD On 04/23/2013. (Blood work between 9:00 and 10:00 am)    Contact information:   [address] [place] [address] [phone]        The results of significant diagnostics from this hospitalization (including imaging, microbiology, ancillary and laboratory) are listed below for reference.    Significant Diagnostic Studies: Ct Abdomen Pelvis Wo Contrast  03/19/2013   *RADIOLOGY REPORT*  Clinical Data: Right flank pain for 2 weeks, vomiting, history of kidney stones  CT ABDOMEN AND PELVIS WITHOUT CONTRAST  Technique:  Multidetector CT imaging of the abdomen and pelvis was performed following the standard protocol without intravenous contrast.  Comparison: None.  Findings: The lung bases are clear.  The liver is unremarkable in the unenhanced state.  No calcified  gallstones are seen.  The pancreas is normal in size.  The adrenal glands and spleen are unremarkable.  The stomach is moderately fluid distended with no abnormality noted.  The left kidney is markedly atrophic and it does contain multiple calcifications.  The right kidney appears compensatorily hypertrophied.  There are multiple calcifications within the medullary portions of the right kidney consistent with medullary sponge kidney.   The largest calculus is in the lower pole collecting system of the right kidney measuring 18 mm in diameter. However, there is moderate hydronephrosis present with hydroureter. The point of obstruction is within the mid pelvis and there appear to be at least three calculi stacked upon each other of approximately 5, 6, and 7 mm in diameter respectively.  The more distal right ureter is normal in caliber.  The left ureter is small to the urinary bladder.  The urinary bladder is decompressed.  The uterus is anteverted.  No fluid is seen within the pelvis.  The colon is decompressed and the terminal ileum is  unremarkable.  There are three midline anterior abdominal wall hernias present. These hernias contain fat and nondistended loops of bowel. Minimally prominent groin lymph nodes are noted bilaterally.  IMPRESSION:  1.  Moderate right hydronephrosis and hydroureter to a point of partial obstruction by distal right ureteral calculi stacked upon each other of5, 6 and 7 mm in diameter respectively. 2.  Multiple right renal calculi consistent with medullary sponge kidney, and the right kidney is compensatorily hypertrophied. 3.  Atrophic left kidney with multiple calcifications as well.   Original Report Authenticated By: Dwyane Dee, M.D.   Dg Abd 1 View - Kub  03/21/2013   *RADIOLOGY REPORT*  Clinical Data: Right ureteral calculus.  ABDOMEN - 1 VIEW  Comparison: 03/19/2013 CT and radiographs.  Findings: No interval change of the distal right ureteral calculi overlying the right sacrum.  Calculi in the right renal collecting system also appear unchanged.  Left renal calcifications are also similar.  Largest stone in the right inferior pole measures 21 mm.  IMPRESSION: Unchanged bilateral renal collecting system calculi and a cluster of calculi in the distal right ureter overlying the right sacral ala.   Original Report Authenticated By: Andreas Newport, M.D.   Dg Abd 1 View  03/19/2013   *RADIOLOGY REPORT*  Clinical Data: Flank pain.  Known renal stones.  ABDOMEN - 1 VIEW  Comparison: CT 03/19/2013  Findings: Multiple right renal calcifications are again noted as seen on CT.  Calcifications in the right side of the pelvis likely reflect the ureteral stones seen by CT.  No real change since prior study.  Moderate stool burden in the right side of the colon.  No evidence of bowel obstruction.  No free air organomegaly.  IMPRESSION: Stable right renal calcifications and mid to distal right ureteral stones.   Original Report Authenticated By: Charlett Nose, M.D.    Microbiology: Recent Results (from the past 240  hour(s))  URINE CULTURE     Status: None   Collection Time    03/19/13  4:59 PM      Result Value Range Status   Specimen Description URINE, CLEAN CATCH   Final   Special Requests NONE   Final   Culture  Setup Time 03/20/2013 03:46   Final   Colony Count NO GROWTH   Final   Culture NO GROWTH   Final   Report Status 03/21/2013 FINAL   Final  URINE CULTURE     Status: None  Collection Time    03/20/13  7:45 PM      Result Value Range Status   Specimen Description URINE, CLEAN CATCH   Final   Special Requests NONE   Final   Culture  Setup Time 03/20/2013 21:00   Final   Colony Count 7,000 COLONIES/ML   Final   Culture INSIGNIFICANT GROWTH   Final   Report Status 03/21/2013 FINAL   Final  SURGICAL PCR SCREEN     Status: None   Collection Time    03/21/13  1:39 AM      Result Value Range Status   MRSA, PCR NEGATIVE  NEGATIVE Final   Staphylococcus aureus NEGATIVE  NEGATIVE Final   Comment:            The Xpert SA Assay (FDA     approved for NASAL specimens     in patients over 92 years of age),     is one component of     a comprehensive surveillance     program.  Test performance has     been validated by [employer] for patients greater     than or equal to 60 year old.     It is not intended     to diagnose infection nor to     guide or monitor treatment.     Labs: Basic Metabolic Panel:  Recent Labs Lab 03/20/13 0521 03/20/13 1811 03/21/13 0529 03/21/13 1052 03/22/13 0530 03/23/13 0456  NA 142 140 143  --  142 141  K 3.2* 3.2* 3.4*  --  3.0* 3.4*  CL 111 109 116*  --  113* 110  CO2 16* 16* 15*  --  19 18*  GLUCOSE 82 100* 97  --  79 99  BUN 30* 29* 28*  --  18 14  CREATININE 2.52* 2.55* 2.35*  --  1.63* 1.63*  CALCIUM 7.7* 7.7* 8.0*  --  7.5* 7.6*  MG  --   --   --  2.4  --   --    Liver Function Tests:  Recent Labs Lab 03/20/13 0101 03/20/13 0521  AST 18 14  ALT 18 15  ALKPHOS 96 88  BILITOT 0.3 0.3  PROT 7.8 6.9  ALBUMIN 3.8 3.3*   No  results found for this basename: LIPASE, AMYLASE,  in the last 168 hours No results found for this basename: AMMONIA,  in the last 168 hours CBC:  Recent Labs Lab 03/19/13 1836 03/20/13 0521 03/21/13 0529 03/23/13 0456  WBC 10.7* 8.2 7.0 7.1  NEUTROABS 8.8*  --   --   --   HGB 10.8* 9.9* 9.8* 9.9*  HCT 34.9* 31.9* 32.3* 33.0*  MCV 77.4* 78.4 79.4 80.1  PLT 264 225 235 236   Cardiac Enzymes: No results found for this basename: CKTOTAL, CKMB, CKMBINDEX, TROPONINI,  in the last 168 hours BNP: BNP (last 3 results) No results found for this basename: PROBNP,  in the last 8760 hours CBG: No results found for this basename: GLUCAP,  in the last 168 hours     Signed:  Gwenyth Bender  [facility] 03/23/2013, 11:53 AM

## 2013-03-23 NOTE — Progress Notes (Signed)
UR chart review completed.  

## 2013-03-23 NOTE — Discharge Summary (Signed)
Patient seen. I agree with discharge.  Brendia Sacks, MD [facility] [phone]

## 2013-03-23 NOTE — Progress Notes (Signed)
Interval history/Subjective: No issues overnight. She feels well. She wants to go home.  Objective: Afebrile, vital signs stable. Appears calm and comfortable. Cardiovascular regular rate and rhythm. Respiratory clear to auscultation bilaterally.  Labs/studies: Potassium improved to 3.4. Creatinine stable 1.63. Baseline unknown. Hemoglobin stable 9.9.  Assessment:  Urolithiasis with hydronephrosis, status post lithotripsy  Acute renal failure versus chronic kidney disease. Suspect chronic kidney disease.  Renal tubular acidosis type I, suspected  Hypokalemia with associated metabolic acidosis  UTI--urine culture on revealing. Treated with Rocephin. No further antibiotics needed.  Normocytic anemia, likely chronic disease  Plan:  Followup with urology as an outpatient as directed.  Followup with nephrology. Establish with primary care physician of choice in followup in one week. Repeat basic metabolic panel as an outpatient.  Continue potassium supplementation at current dosing and schedule, discussed with nephrology  I discussed the importance of close followup with the patient including establishing with a primary care physician of her choice and a nephrologist for ongoing chronic care. She expressed understanding. She can followup in 3-4 weeks with nephrology either here or location of her choice.   Brendia Sacks, MD [facility] [phone]

## 2013-03-23 NOTE — Progress Notes (Signed)
Subjective: Interval History:  flank pain is better and patient states that she has passed 2 stones. She has no nausea or vomiting. She denies also any difficulty in breathing..  Objective: Vital signs in last 24 hours: Temp:  [97.9 F (36.6 C)-98.1 F (36.7 C)] 98.1 F (36.7 C) (06/27 0512) Pulse Rate:  [62-90] 90 (06/27 0512) Resp:  [18] 18 (06/27 0512) BP: (95-115)/(62-72) 115/72 mmHg (06/27 0512) SpO2:  [94 %-97 %] 94 % (06/27 0512) Weight change:   Intake/Output from previous day: 06/26 0701 - 06/27 0700 In: 2023.8 [P.O.:1680; I.V.:343.8] Out: -  Intake/Output this shift:    General appearance: alert, cooperative and no distress Resp: clear to auscultation bilaterally Cardio: regular rate and rhythm, S1, S2 normal, no murmur, click, rub or gallop GI: Tenderness and positive bowel sound Extremities: extremities normal, atraumatic, no cyanosis or edema  Lab Results:  Recent Labs  03/21/13 0529 03/23/13 0456  WBC 7.0 7.1  HGB 9.8* 9.9*  HCT 32.3* 33.0*  PLT 235 236   BMET:   Recent Labs  03/22/13 0530 03/23/13 0456  NA 142 141  K 3.0* 3.4*  CL 113* 110  CO2 19 18*  GLUCOSE 79 99  BUN 18 14  CREATININE 1.63* 1.63*  CALCIUM 7.5* 7.6*   No results found for this basename: PTH,  in the last 72 hours Iron Studies:  No results found for this basename: IRON, TIBC, TRANSFERRIN, FERRITIN,  in the last 72 hours  Studies/Results: Dg Abd 1 View - Kub  03/21/2013   *RADIOLOGY REPORT*  Clinical Data: Right ureteral calculus.  ABDOMEN - 1 VIEW  Comparison: 03/19/2013 CT and radiographs.  Findings: No interval change of the distal right ureteral calculi overlying the right sacrum.  Calculi in the right renal collecting system also appear unchanged.  Left renal calcifications are also similar.  Largest stone in the right inferior pole measures 21 mm.  IMPRESSION: Unchanged bilateral renal collecting system calculi and a cluster of calculi in the distal right ureter  overlying the right sacral ala.   Original Report Authenticated By: Andreas Newport, M.D.    I have reviewed the patient's current medications.  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 Presently on pottassuim citrate  Metabolic acidosis has improved Problem #2 hypokalemia secondary to the above. Patient on potassium citrate her potassium has improved . Problem #3 acute kidney injury supposed to chronic. Possibly obstructive. 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 However other etiologies has this moment cannot ruled out. Problem #6 history of kidney stone with hydronephrosis Plan: We will continue with potassium citrate  D/C iv fluid Patient advised to find a primary care physcian and contact [place] for follow up or local nephrologist. Until she find local nephrologist if she does not mind driving her I will see her in 3 to 4 weeks  LOS: 4 days   Keelyn Monjaras S 03/23/2013,9:29 AM

## 2013-03-26 ENCOUNTER — Encounter (HOSPITAL_COMMUNITY): Payer: Self-pay | Admitting: Urology

## 2013-05-05 ENCOUNTER — Encounter (HOSPITAL_COMMUNITY): Payer: Self-pay | Admitting: *Deleted

## 2013-05-05 ENCOUNTER — Emergency Department (HOSPITAL_COMMUNITY)
Admission: EM | Admit: 2013-05-05 | Discharge: 2013-05-05 | Disposition: A | Discharge status: Home / Self Care | Payer: Self-pay | Attending: Emergency Medicine | Admitting: Emergency Medicine

## 2013-05-05 ENCOUNTER — Emergency Department (HOSPITAL_COMMUNITY): Payer: Self-pay

## 2013-05-05 DIAGNOSIS — Z9089 Acquired absence of other organs: Secondary | ICD-10-CM | POA: Insufficient documentation

## 2013-05-05 DIAGNOSIS — R300 Dysuria: Secondary | ICD-10-CM | POA: Insufficient documentation

## 2013-05-05 DIAGNOSIS — Z862 Personal history of diseases of the blood and blood-forming organs and certain disorders involving the immune mechanism: Secondary | ICD-10-CM | POA: Insufficient documentation

## 2013-05-05 DIAGNOSIS — Z3202 Encounter for pregnancy test, result negative: Secondary | ICD-10-CM | POA: Insufficient documentation

## 2013-05-05 DIAGNOSIS — R112 Nausea with vomiting, unspecified: Secondary | ICD-10-CM | POA: Insufficient documentation

## 2013-05-05 DIAGNOSIS — Z9889 Other specified postprocedural states: Secondary | ICD-10-CM | POA: Insufficient documentation

## 2013-05-05 DIAGNOSIS — Z8639 Personal history of other endocrine, nutritional and metabolic disease: Secondary | ICD-10-CM | POA: Insufficient documentation

## 2013-05-05 DIAGNOSIS — R34 Anuria and oliguria: Secondary | ICD-10-CM | POA: Insufficient documentation

## 2013-05-05 DIAGNOSIS — R109 Unspecified abdominal pain: Secondary | ICD-10-CM | POA: Insufficient documentation

## 2013-05-05 DIAGNOSIS — Z87442 Personal history of urinary calculi: Secondary | ICD-10-CM | POA: Insufficient documentation

## 2013-05-05 LAB — URINALYSIS, ROUTINE W REFLEX MICROSCOPIC
Bilirubin Urine: NEGATIVE
Nitrite: NEGATIVE
Specific Gravity, Urine: 1.01 (ref 1.005–1.030)
Urobilinogen, UA: 0.2 mg/dL (ref 0.0–1.0)
pH: 7.5 (ref 5.0–8.0)

## 2013-05-05 LAB — CBC WITH DIFFERENTIAL/PLATELET
Eosinophils Absolute: 0 10*3/uL (ref 0.0–0.7)
Hemoglobin: 11 g/dL — ABNORMAL LOW (ref 12.0–15.0)
Lymphs Abs: 1.2 10*3/uL (ref 0.7–4.0)
MCH: 24.6 pg — ABNORMAL LOW (ref 26.0–34.0)
Monocytes Relative: 5 % (ref 3–12)
Neutro Abs: 6.4 10*3/uL (ref 1.7–7.7)
Neutrophils Relative %: 80 % — ABNORMAL HIGH (ref 43–77)
Platelets: 209 10*3/uL (ref 150–400)
RBC: 4.48 MIL/uL (ref 3.87–5.11)
WBC: 8 10*3/uL (ref 4.0–10.5)

## 2013-05-05 LAB — COMPREHENSIVE METABOLIC PANEL
ALT: 16 U/L (ref 0–35)
Albumin: 3.7 g/dL (ref 3.5–5.2)
Alkaline Phosphatase: 104 U/L (ref 39–117)
BUN: 11 mg/dL (ref 6–23)
Chloride: 112 mEq/L (ref 96–112)
GFR calc Af Amer: 73 mL/min — ABNORMAL LOW (ref >90)
Glucose, Bld: 94 mg/dL (ref 70–99)
Potassium: 3.8 mEq/L (ref 3.5–5.1)
Sodium: 139 mEq/L (ref 135–145)
Total Bilirubin: 0.4 mg/dL (ref 0.3–1.2)
Total Protein: 7.4 g/dL (ref 6.0–8.3)

## 2013-05-05 LAB — URINE MICROSCOPIC-ADD ON

## 2013-05-05 LAB — PREGNANCY, URINE: Preg Test, Ur: NEGATIVE

## 2013-05-05 MED ORDER — HYDROCODONE-ACETAMINOPHEN 5-325 MG PO TABS
1.0000 | ORAL_TABLET | Freq: Once | ORAL | Status: AC
  Administered 2013-05-05: 1 via ORAL
  Filled 2013-05-05: qty 1

## 2013-05-05 NOTE — ED Notes (Addendum)
Pt was seen at [place] er a few weeks ago, diagnosed with uti, given antibiotics states that she did not get any better, went back to [place] last night, given another prescription of antibiotics, zofran and pain medication, states that she was given samples of the pain and nausea medication but is not able to keep the medicine down due to n/v. Continues to have abd pain as well.

## 2013-05-05 NOTE — ED Provider Notes (Signed)
CSN: [id number]     Arrival date & time 05/05/13  1547 History    This chart was scribed for Hilario Quarry, MD by Quintella Reichert, ED scribe.  This patient was seen in room APA10/APA10 and the patient's care was started at 5:45 PM.   Chief Complaint  Patient presents with  . Emesis  . Abdominal Pain    Patient is a 22 y.o. female presenting with abdominal pain. The history is provided by the patient. No language interpreter was used.  Abdominal Pain Pain location:  Suprapubic Pain severity:  Moderate Onset quality:  Gradual Duration:  3 weeks Timing:  Constant Progression:  Worsening Chronicity:  New Relieved by:  Nothing Worsened by:  Nothing tried Ineffective treatments: antibiotics. Associated symptoms: dysuria, nausea and vomiting   Associated symptoms: no diarrhea, no fever and no vaginal discharge   Risk factors: multiple surgeries     HPI Comments: Chelsea [name] is a 22 y.o. female with h/o kidney stones who presents to the Emergency Department complaining of 3 weeks of gradually-worsening suprapubic abdominal pain with accompanying nausea, emesis, right flank pain, dysuria and decreased urine output.  Pt was seen at the [facility] ER several weeks ago with the same symptoms and diagnosed with a severe UTI and placed on an antibiotics course.  She took antibiotics as prescribed but states her symptoms were not relieved.  Last night she went to the [facility] ER again and was prescribed a new course of antibiotics, and given a sample of Zofran and pain medication.  She states she has not filled her antibiotics prescription and has been unable to tolerate her other medications.  Pt does not know the name of either antibiotic medication that she has taken.  She states her symptoms have continued to grow more severe and she has been throwing up "constantly" for the past 2 days and has been unable to tolerate food or liquids.  She denies diarrhea, fever, vaginal discharge, or any other  associated symptoms.   Pt has also recently been diagnosed with kidney stones but has not followed up.  She has h/o colon surgery and appendectomy.  She is G1P0A1.    Past Medical History  Diagnosis Date  . Kidney stones   . Rickets     Past Surgical History  Procedure Laterality Date  . Ureteral stents    . Has only 1 functioning  kidney    . Colon surgery    . Appendectomy    . Leg surgery    . Extracorporeal shock wave lithotripsy Right 03/21/2013    Procedure: EXTRACORPOREAL SHOCK WAVE LITHOTRIPSY (ESWL) RIGHT URETERAL CALCULUS;  Surgeon: Ky Barban, MD;  Location: [facility];  Service: Urology;  Laterality: Right;    Family History  Problem Relation Age of Onset  . Kidney disease Father     History  Substance Use Topics  . Smoking status: Never Smoker   . Smokeless tobacco: Not on file  . Alcohol Use: No    OB History   Grav Para Term Preterm Abortions TAB SAB Ect Mult Living                   Review of Systems  Constitutional: Negative for fever.  Gastrointestinal: Positive for nausea, vomiting and abdominal pain. Negative for diarrhea.  Genitourinary: Positive for dysuria, flank pain and decreased urine volume. Negative for vaginal discharge.  All other systems reviewed and are negative.      Allergies  Review of patient's allergies  indicates no known allergies.  Home Medications   Current Outpatient Rx  Name  Route  Sig  Dispense  Refill  . acetaminophen (TYLENOL) 500 MG tablet   Oral   Take 500-1,000 mg by mouth every 6 (six) hours as needed for pain.         . citalopram (CELEXA) 20 MG tablet   Oral   Take 20 mg by mouth daily.         Marland Kitchen HYDROcodone-acetaminophen (NORCO/VICODIN) 5-325 MG per tablet   Oral   Take 1 tablet by mouth every 6 (six) hours as needed for pain.   12 tablet   0   . potassium citrate (UROCIT-K) 10 MEQ (1080 MG) SR tablet   Oral   Take 3 tablets (30 mEq total) by mouth 3 (three) times daily with meals.    270 tablet   0    BP 126/61  Pulse 76  Temp(Src) 98.3 F (36.8 C) (Oral)  Resp 18  Ht 4\' 9"  (1.448 m)  Wt 229 lb (103.874 kg)  BMI 49.54 kg/m2  SpO2 100%  LMP 04/22/2013  Physical Exam  Nursing note and vitals reviewed. Constitutional: She is oriented to person, place, and time. She appears well-developed and well-nourished.  HENT:  Head: Normocephalic and atraumatic.  Right Ear: External ear normal.  Left Ear: External ear normal.  Nose: Nose normal.  Mouth/Throat: Oropharynx is clear and moist.  Eyes: Conjunctivae and EOM are normal. Pupils are equal, round, and reactive to light.  Neck: Normal range of motion. Neck supple.  Cardiovascular: Normal rate, regular rhythm, normal heart sounds and intact distal pulses.   Pulmonary/Chest: Effort normal and breath sounds normal.  Abdominal: Soft. Bowel sounds are normal. She exhibits no mass. There is tenderness. There is no rebound and no guarding.  Mild suprapubic tenderness.  Musculoskeletal: Normal range of motion.  Neurological: She is alert and oriented to person, place, and time. She has normal reflexes.  Skin: Skin is warm and dry.  Psychiatric: She has a normal mood and affect. Her behavior is normal. Judgment and thought content normal.    ED Course  Procedures (including critical care time)  DIAGNOSTIC STUDIES: Oxygen Saturation is 100% on room air, normal by my interpretation.    COORDINATION OF CARE: 5:52 PM-Discussed treatment plan which includes catheterization, UA, CT abdomen and labs with pt at bedside and pt agreed to plan.     Labs Reviewed  URINALYSIS, ROUTINE W REFLEX MICROSCOPIC - Abnormal; Notable for the following:    Leukocytes, UA SMALL (*)    All other components within normal limits  URINE MICROSCOPIC-ADD ON - Abnormal; Notable for the following:    Squamous Epithelial / LPF MANY (*)    Bacteria, UA FEW (*)    All other components within normal limits  CBC WITH DIFFERENTIAL - Abnormal;  Notable for the following:    Hemoglobin 11.0 (*)    MCH 24.6 (*)    RDW 16.8 (*)    Neutrophils Relative % 80 (*)    All other components within normal limits  COMPREHENSIVE METABOLIC PANEL - Abnormal; Notable for the following:    Creatinine, Ser 1.22 (*)    GFR calc non Af Amer 63 (*)    GFR calc Af Amer 73 (*)    All other components within normal limits  URINE CULTURE  PREGNANCY, URINE   Ct Abdomen Pelvis Wo Contrast  05/05/2013   *RADIOLOGY REPORT*  Clinical Data: Right flank pain  CT  ABDOMEN AND PELVIS WITHOUT CONTRAST  Technique:  Multidetector CT imaging of the abdomen and pelvis was performed following the standard protocol without intravenous contrast.  Comparison: 03/19/2013  Findings: Innumerable calculi are present in the collecting system of the right kidney.  No hydronephrosis.  No ureteral calculus. The left kidney is calcified and severely atrophic.  There is laxity of the anterior abdominal wall.  Small and large bowel loops are bulging into the laxity.  There is an additional umbilical hernia containing adipose tissue and small bowel loops. No evidence of small bowel obstruction.  The unenhanced liver, gallbladder, spleen, pancreas, adrenal glands are within normal limits.  No free fluid.  Bladder, uterus, and adnexa are within normal limits.  IMPRESSION: Right nephrolithiasis without evidence of hydronephrosis or ureteral calculus.  Ventral abdominal hernia and laxity of the anterior abdominal wall without evidence of bowel obstruction or compromise.   Original Report Authenticated By: Jolaine Click, M.D.   No diagnosis found.  MDM  Patient advised to continue current abx but no evidence of infection today.  Plan urine culture.  Kidney function stable.  Patient advised follow up with nephrologist and urologist as discussed on prior hospital discharge.     Hilario Quarry, MD 05/05/13 [phone]

## 2013-05-06 LAB — URINE CULTURE

## 2013-05-11 ENCOUNTER — Encounter (HOSPITAL_COMMUNITY): Payer: Self-pay

## 2013-05-11 ENCOUNTER — Emergency Department (HOSPITAL_COMMUNITY)
Admission: EM | Admit: 2013-05-11 | Discharge: 2013-05-11 | Discharge status: Against Medical Advice | Payer: Self-pay | Attending: Emergency Medicine | Admitting: Emergency Medicine

## 2013-05-11 DIAGNOSIS — R1110 Vomiting, unspecified: Secondary | ICD-10-CM | POA: Insufficient documentation

## 2013-05-11 DIAGNOSIS — R109 Unspecified abdominal pain: Secondary | ICD-10-CM | POA: Insufficient documentation

## 2013-05-11 NOTE — ED Notes (Signed)
Pt no longer on stretcher, unable to locate in all waiting areas

## 2013-05-11 NOTE — ED Notes (Signed)
Pt reports hx of kidney stones, states this episode feels the same with vomiting

## 2013-05-22 ENCOUNTER — Encounter (HOSPITAL_COMMUNITY): Payer: Self-pay | Admitting: *Deleted

## 2013-05-22 ENCOUNTER — Emergency Department (HOSPITAL_COMMUNITY)
Admission: EM | Admit: 2013-05-22 | Discharge: 2013-05-22 | Disposition: A | Discharge status: Home / Self Care | Payer: Self-pay | Attending: Emergency Medicine | Admitting: Emergency Medicine

## 2013-05-22 DIAGNOSIS — Z87442 Personal history of urinary calculi: Secondary | ICD-10-CM | POA: Insufficient documentation

## 2013-05-22 DIAGNOSIS — Z8639 Personal history of other endocrine, nutritional and metabolic disease: Secondary | ICD-10-CM | POA: Insufficient documentation

## 2013-05-22 DIAGNOSIS — R1110 Vomiting, unspecified: Secondary | ICD-10-CM | POA: Insufficient documentation

## 2013-05-22 DIAGNOSIS — Z3202 Encounter for pregnancy test, result negative: Secondary | ICD-10-CM | POA: Insufficient documentation

## 2013-05-22 DIAGNOSIS — R300 Dysuria: Secondary | ICD-10-CM | POA: Insufficient documentation

## 2013-05-22 DIAGNOSIS — R109 Unspecified abdominal pain: Secondary | ICD-10-CM | POA: Insufficient documentation

## 2013-05-22 DIAGNOSIS — R319 Hematuria, unspecified: Secondary | ICD-10-CM | POA: Insufficient documentation

## 2013-05-22 DIAGNOSIS — Z862 Personal history of diseases of the blood and blood-forming organs and certain disorders involving the immune mechanism: Secondary | ICD-10-CM | POA: Insufficient documentation

## 2013-05-22 DIAGNOSIS — R509 Fever, unspecified: Secondary | ICD-10-CM | POA: Insufficient documentation

## 2013-05-22 LAB — URINALYSIS, ROUTINE W REFLEX MICROSCOPIC
Bilirubin Urine: NEGATIVE
Glucose, UA: NEGATIVE mg/dL
Ketones, ur: NEGATIVE mg/dL
Protein, ur: NEGATIVE mg/dL
pH: 6.5 (ref 5.0–8.0)

## 2013-05-22 LAB — URINE MICROSCOPIC-ADD ON

## 2013-05-22 MED ORDER — OXYCODONE-ACETAMINOPHEN 5-325 MG PO TABS
2.0000 | ORAL_TABLET | Freq: Once | ORAL | Status: AC
  Administered 2013-05-22: 2 via ORAL
  Filled 2013-05-22: qty 2

## 2013-05-22 MED ORDER — OXYCODONE-ACETAMINOPHEN 5-325 MG PO TABS: 1.0000 | ORAL_TABLET | Freq: Four times a day (QID) | ORAL | Status: DC | PRN

## 2013-05-22 NOTE — ED Notes (Signed)
Pt reports r flank pain radiating around to r lower abd x 2 days.  Reports has had a couple of episodes of diarrhea yesterday and vomited once this am.  Reports history of kidney stones.  LMP was 1 1/2 months ago.  Reports periods are usually irregular.

## 2013-05-22 NOTE — ED Provider Notes (Signed)
CSN: [id number]     Arrival date & time 05/22/13  1452 History  This chart was scribed for Chelsea B. Bernette Mayers, MD,  by Ashley Jacobs, ED Scribe. The patient was seen in room APA09/APA09 and the patient's care was started at  3:35 PM.  First MD Initiated Contact with Patient 05/22/13 1532     Chief Complaint  Patient presents with  . Flank Pain  . Abdominal Pain   (Consider location/radiation/quality/duration/timing/severity/associated sxs/prior Treatment) Patient is a 21 y.o. female presenting with flank pain and abdominal pain. The history is provided by the patient and medical records. No language interpreter was used.  Flank Pain The current episode started more than 2 days ago. She has tried nothing for the symptoms.  Abdominal Pain  HPI Comments: Chelsea [name] is a 22 y.o. female  With history of kidney stonewho presents to the Emergency Department complaining of right sided flank pain that has worsen over the past four days. Pt was seen 8/9 for similar and had CT showing intrarenal stones but no ureteral stone. She has also been on Abx recently for UTI prescribed in [place], does not remember names. Pt mentions having dysuria, vomiting (once today) ,fever and hematuria.  Nothing seems to relieve her pain.   Past Medical History  Diagnosis Date  . Kidney stones   . Rickets    Past Surgical History  Procedure Laterality Date  . Ureteral stents    . Has only 1 functioning  kidney    . Colon surgery    . Appendectomy    . Leg surgery    . Extracorporeal shock wave lithotripsy Right 03/21/2013    Procedure: EXTRACORPOREAL SHOCK WAVE LITHOTRIPSY (ESWL) RIGHT URETERAL CALCULUS;  Surgeon: Ky Barban, MD;  Location: [facility];  Service: Urology;  Laterality: Right;   Family History  Problem Relation Age of Onset  . Kidney disease Father    History  Substance Use Topics  . Smoking status: Never Smoker   . Smokeless tobacco: Not on file  . Alcohol Use: No   OB History    Grav Para Term Preterm Abortions TAB SAB Ect Mult Living                 Review of Systems  Genitourinary: Positive for flank pain.  All other systems reviewed and are negative except as noted in HPI.    Allergies  Review of patient's allergies indicates no known allergies.  Home Medications   Current Outpatient Rx  Name  Route  Sig  Dispense  Refill  . acetaminophen (TYLENOL) 500 MG tablet   Oral   Take 500-1,000 mg by mouth every 6 (six) hours as needed for pain.         . potassium citrate (UROCIT-K) 10 MEQ (1080 MG) SR tablet   Oral   Take 3 tablets (30 mEq total) by mouth 3 (three) times daily with meals.   270 tablet   0    BP 130/80  Pulse 87  Temp(Src) 97.9 F (36.6 C) (Oral)  Resp 16  SpO2 99%  LMP 04/22/2013 Physical Exam  Nursing note and vitals reviewed. Constitutional: She is oriented to person, place, and time. She appears well-developed and well-nourished.  HENT:  Head: Normocephalic and atraumatic.  Eyes: EOM are normal. Pupils are equal, round, and reactive to light.  Neck: Normal range of motion. Neck supple.  Cardiovascular: Normal rate, normal heart sounds and intact distal pulses.   Pulmonary/Chest: Effort normal and breath sounds normal.  Abdominal: Bowel sounds are normal. She exhibits no distension. There is tenderness (mild supra pubic region). There is no rebound and no guarding.  Musculoskeletal: Normal range of motion. She exhibits tenderness (right lower back). She exhibits no edema.  Neurological: She is alert and oriented to person, place, and time. She has normal strength. No cranial nerve deficit or sensory deficit.  Skin: Skin is warm and dry. No rash noted.  Psychiatric: She has a normal mood and affect.    ED Course  Procedures (including critical care time) DIAGNOSTIC STUDIES: Oxygen Saturation is 99% on room air, normal by my interpretation.    COORDINATION OF CARE: 3:40 PM Discussed course of care with pt . Pt  understands and agrees.   Labs Review Labs Reviewed  URINALYSIS, ROUTINE W REFLEX MICROSCOPIC - Abnormal; Notable for the following:    Leukocytes, UA SMALL (*)    All other components within normal limits  URINE MICROSCOPIC-ADD ON - Abnormal; Notable for the following:    Squamous Epithelial / LPF FEW (*)    All other components within normal limits  POCT PREGNANCY, URINE   Imaging Review No results found.  MDM   1. Flank pain    UA neg for infection or significant hematuria. Likely MSK back pain, less likely ureteral colic. Non-toxic appearing. Advised Urology followup.   I personally performed the services described in this documentation, which was scribed in my presence. The recorded information has been reviewed and is accurate.     Chelsea B. Bernette Mayers, MD 05/22/13 [phone]

## 2013-05-22 NOTE — ED Notes (Signed)
Pt c/o right flank, lower abd pain, pain with urination that started 4 days ago,

## 2013-06-06 ENCOUNTER — Emergency Department (HOSPITAL_COMMUNITY): Admission: EM | Admit: 2013-06-06 | Discharge: 2013-06-06 | Discharge status: Against Medical Advice | Payer: Self-pay

## 2013-06-06 NOTE — ED Notes (Signed)
Called x 1 without response from waiting room

## 2013-06-06 NOTE — ED Notes (Signed)
Called x 3 for pt without response from waiting room

## 2013-06-06 NOTE — ED Notes (Signed)
Called x 2 with no response from waiting room

## 2013-06-13 ENCOUNTER — Encounter (HOSPITAL_COMMUNITY): Payer: Self-pay

## 2013-06-13 ENCOUNTER — Ambulatory Visit (HOSPITAL_COMMUNITY): Admit: 2013-06-13 | Payer: Medicaid - Out of State

## 2013-06-13 ENCOUNTER — Emergency Department (HOSPITAL_COMMUNITY): Payer: Medicaid - Out of State

## 2013-06-13 ENCOUNTER — Emergency Department (HOSPITAL_COMMUNITY)
Admission: EM | Admit: 2013-06-13 | Discharge: 2013-06-13 | Disposition: A | Discharge status: Home / Self Care | Payer: Medicaid - Out of State | Attending: Emergency Medicine | Admitting: Emergency Medicine

## 2013-06-13 DIAGNOSIS — R1110 Vomiting, unspecified: Secondary | ICD-10-CM | POA: Insufficient documentation

## 2013-06-13 DIAGNOSIS — Z79899 Other long term (current) drug therapy: Secondary | ICD-10-CM | POA: Insufficient documentation

## 2013-06-13 DIAGNOSIS — Z9889 Other specified postprocedural states: Secondary | ICD-10-CM | POA: Insufficient documentation

## 2013-06-13 DIAGNOSIS — N390 Urinary tract infection, site not specified: Secondary | ICD-10-CM | POA: Insufficient documentation

## 2013-06-13 DIAGNOSIS — R109 Unspecified abdominal pain: Secondary | ICD-10-CM

## 2013-06-13 DIAGNOSIS — Z87442 Personal history of urinary calculi: Secondary | ICD-10-CM | POA: Insufficient documentation

## 2013-06-13 DIAGNOSIS — Z3202 Encounter for pregnancy test, result negative: Secondary | ICD-10-CM | POA: Insufficient documentation

## 2013-06-13 DIAGNOSIS — Z8619 Personal history of other infectious and parasitic diseases: Secondary | ICD-10-CM | POA: Insufficient documentation

## 2013-06-13 DIAGNOSIS — R1013 Epigastric pain: Secondary | ICD-10-CM | POA: Insufficient documentation

## 2013-06-13 DIAGNOSIS — Z9089 Acquired absence of other organs: Secondary | ICD-10-CM | POA: Insufficient documentation

## 2013-06-13 LAB — COMPREHENSIVE METABOLIC PANEL
Albumin: 3.7 g/dL (ref 3.5–5.2)
Alkaline Phosphatase: 91 U/L (ref 39–117)
BUN: 10 mg/dL (ref 6–23)
Potassium: 3.5 mEq/L (ref 3.5–5.1)
Total Protein: 7.5 g/dL (ref 6.0–8.3)

## 2013-06-13 LAB — CBC WITH DIFFERENTIAL/PLATELET
Basophils Relative: 0 % (ref 0–1)
Eosinophils Absolute: 0.1 10*3/uL (ref 0.0–0.7)
Hemoglobin: 10.9 g/dL — ABNORMAL LOW (ref 12.0–15.0)
MCH: 25.1 pg — ABNORMAL LOW (ref 26.0–34.0)
MCHC: 31.5 g/dL (ref 30.0–36.0)
Monocytes Relative: 5 % (ref 3–12)
Neutrophils Relative %: 73 % (ref 43–77)
Platelets: 247 10*3/uL (ref 150–400)
RDW: 15.8 % — ABNORMAL HIGH (ref 11.5–15.5)

## 2013-06-13 LAB — URINALYSIS, ROUTINE W REFLEX MICROSCOPIC
Bilirubin Urine: NEGATIVE
Hgb urine dipstick: NEGATIVE
Protein, ur: NEGATIVE mg/dL
Urobilinogen, UA: 0.2 mg/dL (ref 0.0–1.0)

## 2013-06-13 LAB — PREGNANCY, URINE: Preg Test, Ur: NEGATIVE

## 2013-06-13 LAB — LIPASE, BLOOD: Lipase: 42 U/L (ref 11–59)

## 2013-06-13 MED ORDER — SULFAMETHOXAZOLE-TRIMETHOPRIM 800-160 MG PO TABS: 1.0000 | ORAL_TABLET | Freq: Two times a day (BID) | ORAL | Status: AC

## 2013-06-13 MED ORDER — HYDROCODONE-ACETAMINOPHEN 5-325 MG PO TABS: 2.0000 | ORAL_TABLET | ORAL | Status: DC | PRN

## 2013-06-13 NOTE — ED Provider Notes (Signed)
CSN: [id number]     Arrival date & time 06/13/13  0044 History   First MD Initiated Contact with Patient 06/13/13 0208     Chief Complaint  Patient presents with  . Abdominal Pain  . Emesis   (Consider location/radiation/quality/duration/timing/severity/associated sxs/prior Treatment) HPI Comments: Patient is a 22 year old female presents with complaints of pain in the epigastric region for the past 2 days. She states that this pain has been coming. It is not associated with eating. She denies any vomiting or diarrhea. She denies any constipation. She has no urinary complaints. Last menstrual period was last month and normal  Patient is a 22 y.o. female presenting with abdominal pain and vomiting. The history is provided by the patient.  Abdominal Pain Pain location:  Epigastric Pain quality: cramping   Pain radiates to:  Does not radiate Pain severity:  Moderate Onset quality:  Sudden Duration:  2 days Timing:  Intermittent Progression:  Worsening Chronicity:  New Relieved by:  Nothing Worsened by:  Nothing tried Ineffective treatments:  None tried Associated symptoms: vomiting   Emesis Associated symptoms: abdominal pain     Past Medical History  Diagnosis Date  . Kidney stones   . Rickets    Past Surgical History  Procedure Laterality Date  . Ureteral stents    . Has only 1 functioning  kidney    . Colon surgery    . Appendectomy    . Leg surgery    . Extracorporeal shock wave lithotripsy Right 03/21/2013    Procedure: EXTRACORPOREAL SHOCK WAVE LITHOTRIPSY (ESWL) RIGHT URETERAL CALCULUS;  Surgeon: Ky Barban, MD;  Location: [facility];  Service: Urology;  Laterality: Right;   Family History  Problem Relation Age of Onset  . Kidney disease Father    History  Substance Use Topics  . Smoking status: Never Smoker   . Smokeless tobacco: Not on file  . Alcohol Use: No   OB History   Grav Para Term Preterm Abortions TAB SAB Ect Mult Living                  Review of Systems  Gastrointestinal: Positive for vomiting and abdominal pain.  All other systems reviewed and are negative.    Allergies  Review of patient's allergies indicates no known allergies.  Home Medications   Current Outpatient Rx  Name  Route  Sig  Dispense  Refill  . acetaminophen (TYLENOL) 500 MG tablet   Oral   Take 500-1,000 mg by mouth every 6 (six) hours as needed for pain.         Marland Kitchen docusate sodium (COLACE) 100 MG capsule   Oral   Take 100 mg by mouth 2 (two) times daily.         Marland Kitchen FLUoxetine (PROZAC) 20 MG capsule   Oral   Take 20 mg by mouth daily.         . hydrOXYzine (ATARAX/VISTARIL) 25 MG tablet   Oral   Take 25 mg by mouth 3 (three) times daily as needed for itching.         . Multiple Vitamin (MULTIVITAMIN WITH MINERALS) TABS tablet   Oral   Take 1 tablet by mouth daily.         Marland Kitchen oxyCODONE-acetaminophen (PERCOCET/ROXICET) 5-325 MG per tablet   Oral   Take 1-2 tablets by mouth every 6 (six) hours as needed for pain.   15 tablet   0    BP 123/67  Pulse 78  Temp(Src) 98.7  F (37.1 C) (Oral)  Resp 20  Ht 4\' 10"  (1.473 m)  Wt 210 lb (95.255 kg)  BMI 43.9 kg/m2  SpO2 98%  LMP 05/26/2013 Physical Exam  Nursing note and vitals reviewed. Constitutional: She is oriented to person, place, and time. She appears well-developed and well-nourished. No distress.  HENT:  Head: Normocephalic and atraumatic.  Neck: Normal range of motion. Neck supple.  Cardiovascular: Normal rate and regular rhythm.  Exam reveals no gallop and no friction rub.   No murmur heard. Pulmonary/Chest: Effort normal and breath sounds normal. No respiratory distress. She has no wheezes.  Abdominal: Soft. Bowel sounds are normal. She exhibits no distension. There is no tenderness.  There is mild tenderness to palpation in the epigastric region. There is no rebound or guarding. Bowel sounds are normal active.  Musculoskeletal: Normal range of motion.   Neurological: She is alert and oriented to person, place, and time.  Skin: Skin is warm and dry. She is not diaphoretic.    ED Course  Procedures (including critical care time) Labs Review Labs Reviewed  URINALYSIS, ROUTINE W REFLEX MICROSCOPIC - Abnormal; Notable for the following:    Color, Urine STRAW (*)    Leukocytes, UA MODERATE (*)    All other components within normal limits  URINE MICROSCOPIC-ADD ON - Abnormal; Notable for the following:    Squamous Epithelial / LPF MANY (*)    Bacteria, UA FEW (*)    All other components within normal limits  CBC WITH DIFFERENTIAL - Abnormal; Notable for the following:    Hemoglobin 10.9 (*)    HCT 34.6 (*)    MCH 25.1 (*)    RDW 15.8 (*)    All other components within normal limits  COMPREHENSIVE METABOLIC PANEL - Abnormal; Notable for the following:    Chloride 113 (*)    CO2 15 (*)    Creatinine, Ser 1.24 (*)    GFR calc non Af Amer 62 (*)    GFR calc Af Amer 72 (*)    All other components within normal limits  URINE CULTURE  PREGNANCY, URINE  LIPASE, BLOOD   Imaging Review Dg Abd Acute W/chest  06/13/2013   *RADIOLOGY REPORT*  Clinical Data: Abdominal pain, history of bowel obstruction  ACUTE ABDOMEN SERIES (ABDOMEN 2 VIEW & CHEST 1 VIEW)  Comparison: Prior CT from 05/05/2013  Findings: The cardiac and mediastinal silhouettes are within normal limits.  Lungs are normally inflated.  No airspace consolidation, pleural effusion, or pulmonary edema is identified.  No pneumothorax.  Gas is seen within several nondilated loops of bowel scattered throughout the abdomen.  No dilated loops of bowel to suggest obstruction or ileus are identified. No free air identified. Calcific densities overlying the renal shadows are likely unchanged as compared to the prior examination.  No soft tissue masses identified.  No acute osseous abnormality.  IMPRESSION: 1.  No radiographic evidence of bowel obstruction. 2.  Bilateral renal calcifications,  better evaluated on prior CT from 05/05/2013 3.  No acute cardiopulmonary process.   Original Report Authenticated By: Rise Mu, M.D.    MDM  No diagnosis found. Workup reveals normal laboratory studies and acute abdominal series. Will treat her urine with antibiotics and recommended when necessary followup. We'll also set up an ultrasound of her gallbladder to be performed as an outpatient. She will be discharged tonight and followup with radiology.      Geoffery Lyons, MD 06/13/13 [phone]

## 2013-06-13 NOTE — ED Notes (Signed)
Pt reports abd pain for a couple of days, states is having urinary frequency, burning.  Pt reports upper right abd pain also with vomiting

## 2013-06-14 LAB — URINE CULTURE: Colony Count: 8000

## 2013-08-17 ENCOUNTER — Encounter (HOSPITAL_COMMUNITY): Payer: Self-pay | Admitting: Emergency Medicine

## 2013-08-17 ENCOUNTER — Emergency Department (HOSPITAL_COMMUNITY): Payer: Medicaid - Out of State

## 2013-08-17 ENCOUNTER — Emergency Department (HOSPITAL_COMMUNITY)
Admission: EM | Admit: 2013-08-17 | Discharge: 2013-08-17 | Disposition: A | Discharge status: Home / Self Care | Payer: Medicaid - Out of State | Attending: Emergency Medicine | Admitting: Emergency Medicine

## 2013-08-17 DIAGNOSIS — Z8639 Personal history of other endocrine, nutritional and metabolic disease: Secondary | ICD-10-CM | POA: Insufficient documentation

## 2013-08-17 DIAGNOSIS — R112 Nausea with vomiting, unspecified: Secondary | ICD-10-CM | POA: Insufficient documentation

## 2013-08-17 DIAGNOSIS — Z79899 Other long term (current) drug therapy: Secondary | ICD-10-CM | POA: Insufficient documentation

## 2013-08-17 DIAGNOSIS — Z3202 Encounter for pregnancy test, result negative: Secondary | ICD-10-CM | POA: Insufficient documentation

## 2013-08-17 DIAGNOSIS — N390 Urinary tract infection, site not specified: Secondary | ICD-10-CM

## 2013-08-17 DIAGNOSIS — K5900 Constipation, unspecified: Secondary | ICD-10-CM | POA: Insufficient documentation

## 2013-08-17 DIAGNOSIS — Z862 Personal history of diseases of the blood and blood-forming organs and certain disorders involving the immune mechanism: Secondary | ICD-10-CM | POA: Insufficient documentation

## 2013-08-17 DIAGNOSIS — Z87442 Personal history of urinary calculi: Secondary | ICD-10-CM | POA: Insufficient documentation

## 2013-08-17 HISTORY — DX: Constipation, unspecified: K59.00

## 2013-08-17 LAB — URINE MICROSCOPIC-ADD ON

## 2013-08-17 LAB — URINALYSIS, ROUTINE W REFLEX MICROSCOPIC
Bilirubin Urine: NEGATIVE
Nitrite: NEGATIVE
Specific Gravity, Urine: 1.01 (ref 1.005–1.030)
pH: 7 (ref 5.0–8.0)

## 2013-08-17 LAB — COMPREHENSIVE METABOLIC PANEL
ALT: 22 U/L (ref 0–35)
AST: 20 U/L (ref 0–37)
Alkaline Phosphatase: 104 U/L (ref 39–117)
CO2: 18 mEq/L — ABNORMAL LOW (ref 19–32)
GFR calc Af Amer: 62 mL/min — ABNORMAL LOW (ref >90)
GFR calc non Af Amer: 54 mL/min — ABNORMAL LOW (ref >90)
Glucose, Bld: 94 mg/dL (ref 70–99)
Potassium: 3.4 mEq/L — ABNORMAL LOW (ref 3.5–5.1)
Sodium: 139 mEq/L (ref 135–145)

## 2013-08-17 LAB — PREGNANCY, URINE: Preg Test, Ur: NEGATIVE

## 2013-08-17 LAB — CBC WITH DIFFERENTIAL/PLATELET
Lymphocytes Relative: 23 % (ref 12–46)
Lymphs Abs: 1.6 10*3/uL (ref 0.7–4.0)
Neutro Abs: 4.9 10*3/uL (ref 1.7–7.7)
Neutrophils Relative %: 69 % (ref 43–77)
Platelets: 238 10*3/uL (ref 150–400)
RBC: 4.54 MIL/uL (ref 3.87–5.11)
WBC: 7 10*3/uL (ref 4.0–10.5)

## 2013-08-17 MED ORDER — CEPHALEXIN 500 MG PO CAPS: 500.0000 mg | ORAL_CAPSULE | Freq: Four times a day (QID) | ORAL | Status: DC

## 2013-08-17 NOTE — ED Notes (Signed)
Patient w/history of chronic constipation and prior colectomy has 2 days of abdominal pain and constipation.  States last normal BM was last month. Since that time BMs have been hard and small.  Last night saw some bright red blood in stool.  Pain is cramping and sharp, constant.  Patient use Miralax most days and stool softeners w/out effect.  Has used enema 2 weeks ago w/minimal results.  Also having dysuria and difficulty urinating.  States urine has strong odor.

## 2013-08-17 NOTE — ED Provider Notes (Signed)
CSN: [id number]     Arrival date & time 08/17/13  8756 History   First MD Initiated Contact with Patient 08/17/13 0853     This chart was scribed for Benny Lennert, MD by Manuela Schwartz, ED scribe. This patient was seen in room [facility] and the patient's care was started at 0853.  Chief Complaint  Patient presents with  . Abdominal Pain  . Constipation  . Emesis   Patient is a 22 y.o. female presenting with abdominal pain. The history is provided by the patient. No language interpreter was used.  Abdominal Pain Pain location:  Epigastric Pain quality: cramping and sharp   Pain radiates to:  Does not radiate Pain severity:  Moderate Onset quality:  Gradual Duration:  2 days Timing:  Constant Progression:  Unchanged Chronicity:  Recurrent Context: previous surgery   Context: not recent illness, not recent travel, not sick contacts and not trauma   Relieved by:  Nothing Worsened by:  Nothing tried Ineffective treatments: colace and miralax. Associated symptoms: constipation, nausea and vomiting   Associated symptoms: no chest pain, no chills, no cough, no diarrhea, no fever, no shortness of breath and no sore throat    HPI Comments: Chelsea [name] is a 22 y.o. female who presents to the Emergency Department w/hx of chronic constipation complaining of cramping, sharp epigastric abdominal pain, onset 2 days ago which she attributes to constipation for past 2 days since her last BM. She states only able to have a BM after giving herself an enema. She reports using colace twice per day and sometimes uses miralax but that seems to upset her stomach. She states associated nausea and emesis episodes (she states got sick here in ED bathroom). She reports hx of colon resection for a bowel obstruction.   She has no PCP Past Medical History  Diagnosis Date  . Kidney stones   . Rickets   . Constipation    Past Surgical History  Procedure Laterality Date  . Ureteral stents    . Has only  1 functioning  kidney    . Colon surgery    . Appendectomy    . Leg surgery    . Extracorporeal shock wave lithotripsy Right 03/21/2013    Procedure: EXTRACORPOREAL SHOCK WAVE LITHOTRIPSY (ESWL) RIGHT URETERAL CALCULUS;  Surgeon: Ky Barban, MD;  Location: [facility];  Service: Urology;  Laterality: Right;   Family History  Problem Relation Age of Onset  . Kidney disease Father    History  Substance Use Topics  . Smoking status: Never Smoker   . Smokeless tobacco: Not on file  . Alcohol Use: No   OB History   Grav Para Term Preterm Abortions TAB SAB Ect Mult Living                 Review of Systems  Constitutional: Negative for fever and chills.  HENT: Negative for congestion, rhinorrhea and sore throat.   Respiratory: Negative for cough and shortness of breath.   Cardiovascular: Negative for chest pain.  Gastrointestinal: Positive for nausea, vomiting, abdominal pain and constipation. Negative for diarrhea.  Musculoskeletal: Negative for back pain.  Skin: Negative for color change and rash.  Neurological: Negative for syncope.  All other systems reviewed and are negative.   A complete 10 system review of systems was obtained and all systems are negative except as noted in the HPI and PMH.   Allergies  Review of patient's allergies indicates no known allergies.  Home Medications  Current Outpatient Rx  Name  Route  Sig  Dispense  Refill  . acetaminophen (TYLENOL) 500 MG tablet   Oral   Take 500-1,000 mg by mouth every 6 (six) hours as needed for pain.         Marland Kitchen docusate sodium (COLACE) 100 MG capsule   Oral   Take 100 mg by mouth 2 (two) times daily.         Marland Kitchen FLUoxetine (PROZAC) 20 MG capsule   Oral   Take 20 mg by mouth daily.         Marland Kitchen HYDROcodone-acetaminophen (NORCO) 5-325 MG per tablet   Oral   Take 2 tablets by mouth every 4 (four) hours as needed for pain.   10 tablet   0   . hydrOXYzine (ATARAX/VISTARIL) 25 MG tablet   Oral   Take 25 mg  by mouth 3 (three) times daily as needed for itching.         . Multiple Vitamin (MULTIVITAMIN WITH MINERALS) TABS tablet   Oral   Take 1 tablet by mouth daily.         Marland Kitchen oxyCODONE-acetaminophen (PERCOCET/ROXICET) 5-325 MG per tablet   Oral   Take 1-2 tablets by mouth every 6 (six) hours as needed for pain.   15 tablet   0    Triage Vitals: BP 117/57  Pulse 73  Temp(Src) 97.5 F (36.4 C)  Resp 16  Ht 4\' 10"  (1.473 m)  Wt 190 lb (86.183 kg)  BMI 39.72 kg/m2  SpO2 100%  LMP 06/17/2013 Physical Exam  Constitutional: She is oriented to person, place, and time. She appears well-developed.  HENT:  Head: Normocephalic.  Eyes: Conjunctivae and EOM are normal. No scleral icterus.  Neck: Neck supple. No thyromegaly present.  Cardiovascular: Normal rate and regular rhythm.  Exam reveals no gallop and no friction rub.   No murmur heard. Pulmonary/Chest: No stridor. She has no wheezes. She has no rales. She exhibits no tenderness.  Abdominal: She exhibits no distension. There is tenderness (LUQ tenderness to palpation). There is no rebound.  Musculoskeletal: Normal range of motion. She exhibits no edema.  Lymphadenopathy:    She has no cervical adenopathy.  Neurological: She is oriented to person, place, and time. She exhibits normal muscle tone. Coordination normal.  Skin: No rash noted. No erythema.  Psychiatric: She has a normal mood and affect. Her behavior is normal.    ED Course  Procedures (including critical care time) DIAGNOSTIC STUDIES: Oxygen Saturation is 100% on room air, normal by my interpretation.    COORDINATION OF CARE: At 850 AM Discussed treatment plan with patient which includes UA, blood work. Patient agrees.   Labs Review Labs Reviewed  URINALYSIS, ROUTINE W REFLEX MICROSCOPIC  PREGNANCY, URINE   Imaging Review No results found.  EKG Interpretation   None       MDM  The chart was scribed for me under my direct supervision.  I personally  performed the history, physical, and medical decision making and all procedures in the evaluation of this patient.Benny Lennert, MD 08/17/13 [phone]

## 2013-08-18 LAB — URINE CULTURE: Colony Count: 3000

## 2013-09-11 ENCOUNTER — Ambulatory Visit: Payer: Medicaid - Out of State | Admitting: Gastroenterology

## 2013-10-14 ENCOUNTER — Emergency Department (HOSPITAL_COMMUNITY): Payer: Medicaid - Out of State

## 2013-10-14 ENCOUNTER — Inpatient Hospital Stay (HOSPITAL_COMMUNITY)
Admission: EM | Admit: 2013-10-14 | Discharge: 2013-10-16 | DRG: 694 | Disposition: A | Discharge status: Home / Self Care | Payer: Medicaid - Out of State | Attending: Internal Medicine | Admitting: Internal Medicine

## 2013-10-14 ENCOUNTER — Encounter (HOSPITAL_COMMUNITY): Payer: Self-pay | Admitting: Emergency Medicine

## 2013-10-14 DIAGNOSIS — N200 Calculus of kidney: Secondary | ICD-10-CM | POA: Diagnosis present

## 2013-10-14 DIAGNOSIS — N289 Disorder of kidney and ureter, unspecified: Secondary | ICD-10-CM | POA: Diagnosis present

## 2013-10-14 DIAGNOSIS — N399 Disorder of urinary system, unspecified: Secondary | ICD-10-CM

## 2013-10-14 DIAGNOSIS — E876 Hypokalemia: Secondary | ICD-10-CM

## 2013-10-14 DIAGNOSIS — N390 Urinary tract infection, site not specified: Secondary | ICD-10-CM

## 2013-10-14 DIAGNOSIS — K5900 Constipation, unspecified: Secondary | ICD-10-CM | POA: Diagnosis present

## 2013-10-14 DIAGNOSIS — Z6841 Body Mass Index (BMI) 40.0 and over, adult: Secondary | ICD-10-CM

## 2013-10-14 DIAGNOSIS — Z8639 Personal history of other endocrine, nutritional and metabolic disease: Secondary | ICD-10-CM

## 2013-10-14 DIAGNOSIS — F329 Major depressive disorder, single episode, unspecified: Secondary | ICD-10-CM | POA: Diagnosis present

## 2013-10-14 DIAGNOSIS — D509 Iron deficiency anemia, unspecified: Secondary | ICD-10-CM

## 2013-10-14 DIAGNOSIS — Z23 Encounter for immunization: Secondary | ICD-10-CM

## 2013-10-14 DIAGNOSIS — N183 Chronic kidney disease, stage 3 unspecified: Secondary | ICD-10-CM | POA: Diagnosis present

## 2013-10-14 DIAGNOSIS — Z862 Personal history of diseases of the blood and blood-forming organs and certain disorders involving the immune mechanism: Secondary | ICD-10-CM

## 2013-10-14 DIAGNOSIS — K439 Ventral hernia without obstruction or gangrene: Secondary | ICD-10-CM | POA: Diagnosis present

## 2013-10-14 DIAGNOSIS — N1330 Unspecified hydronephrosis: Secondary | ICD-10-CM | POA: Diagnosis present

## 2013-10-14 DIAGNOSIS — N132 Hydronephrosis with renal and ureteral calculous obstruction: Secondary | ICD-10-CM

## 2013-10-14 DIAGNOSIS — Z87442 Personal history of urinary calculi: Secondary | ICD-10-CM

## 2013-10-14 DIAGNOSIS — N2589 Other disorders resulting from impaired renal tubular function: Secondary | ICD-10-CM | POA: Diagnosis present

## 2013-10-14 DIAGNOSIS — N179 Acute kidney failure, unspecified: Secondary | ICD-10-CM | POA: Diagnosis present

## 2013-10-14 DIAGNOSIS — F3289 Other specified depressive episodes: Secondary | ICD-10-CM | POA: Diagnosis present

## 2013-10-14 DIAGNOSIS — N201 Calculus of ureter: Principal | ICD-10-CM | POA: Diagnosis present

## 2013-10-14 LAB — URINALYSIS, ROUTINE W REFLEX MICROSCOPIC
Bilirubin Urine: NEGATIVE
Glucose, UA: NEGATIVE mg/dL
Ketones, ur: NEGATIVE mg/dL
NITRITE: NEGATIVE
PH: 7 (ref 5.0–8.0)
Protein, ur: NEGATIVE mg/dL
UROBILINOGEN UA: 0.2 mg/dL (ref 0.0–1.0)

## 2013-10-14 LAB — CBC
HCT: 31.9 % — ABNORMAL LOW (ref 36.0–46.0)
Hemoglobin: 9.8 g/dL — ABNORMAL LOW (ref 12.0–15.0)
MCH: 25.2 pg — ABNORMAL LOW (ref 26.0–34.0)
MCHC: 30.7 g/dL (ref 30.0–36.0)
MCV: 82 fL (ref 78.0–100.0)
PLATELETS: 223 10*3/uL (ref 150–400)
RBC: 3.89 MIL/uL (ref 3.87–5.11)
RDW: 17.1 % — AB (ref 11.5–15.5)
WBC: 7.8 10*3/uL (ref 4.0–10.5)

## 2013-10-14 LAB — BASIC METABOLIC PANEL
BUN: 12 mg/dL (ref 6–23)
BUN: 12 mg/dL (ref 6–23)
CHLORIDE: 110 meq/L (ref 96–112)
CHLORIDE: 112 meq/L (ref 96–112)
CO2: 18 mEq/L — ABNORMAL LOW (ref 19–32)
CO2: 18 mEq/L — ABNORMAL LOW (ref 19–32)
CREATININE: 1.43 mg/dL — AB (ref 0.50–1.10)
Calcium: 8.3 mg/dL — ABNORMAL LOW (ref 8.4–10.5)
Calcium: 8.1 mg/dL — ABNORMAL LOW (ref 8.4–10.5)
Creatinine, Ser: 1.39 mg/dL — ABNORMAL HIGH (ref 0.50–1.10)
GFR calc Af Amer: 62 mL/min — ABNORMAL LOW (ref >90)
GFR, EST AFRICAN AMERICAN: 60 mL/min — AB (ref >90)
GFR, EST NON AFRICAN AMERICAN: 52 mL/min — AB (ref >90)
GFR, EST NON AFRICAN AMERICAN: 54 mL/min — AB (ref >90)
Glucose, Bld: 87 mg/dL (ref 70–99)
Glucose, Bld: 88 mg/dL (ref 70–99)
POTASSIUM: 3.7 meq/L (ref 3.7–5.3)
POTASSIUM: 3.9 meq/L (ref 3.7–5.3)
SODIUM: 140 meq/L (ref 137–147)
Sodium: 140 mEq/L (ref 137–147)

## 2013-10-14 LAB — URINE MICROSCOPIC-ADD ON

## 2013-10-14 LAB — PREGNANCY, URINE: PREG TEST UR: NEGATIVE

## 2013-10-14 MED ORDER — SODIUM CHLORIDE 0.9 % IV SOLN
INTRAVENOUS | Status: DC
  Administered 2013-10-14 (×2): via INTRAVENOUS

## 2013-10-14 MED ORDER — DOCUSATE SODIUM 100 MG PO CAPS
100.0000 mg | ORAL_CAPSULE | Freq: Two times a day (BID) | ORAL | Status: DC
  Administered 2013-10-14 – 2013-10-16 (×4): 100 mg via ORAL
  Filled 2013-10-14 (×4): qty 1

## 2013-10-14 MED ORDER — CITALOPRAM HYDROBROMIDE 20 MG PO TABS
40.0000 mg | ORAL_TABLET | Freq: Every day | ORAL | Status: DC
  Administered 2013-10-14 – 2013-10-16 (×2): 40 mg via ORAL
  Filled 2013-10-14 (×2): qty 2

## 2013-10-14 MED ORDER — POLYETHYLENE GLYCOL 3350 17 G PO PACK
17.0000 g | PACK | Freq: Two times a day (BID) | ORAL | Status: DC
  Administered 2013-10-14 – 2013-10-16 (×3): 17 g via ORAL
  Filled 2013-10-14 (×3): qty 1

## 2013-10-14 MED ORDER — ONDANSETRON HCL 4 MG/2ML IJ SOLN
4.0000 mg | Freq: Once | INTRAMUSCULAR | Status: AC
  Administered 2013-10-14: 4 mg via INTRAVENOUS
  Filled 2013-10-14: qty 2

## 2013-10-14 MED ORDER — QUETIAPINE FUMARATE 100 MG PO TABS
100.0000 mg | ORAL_TABLET | Freq: Every day | ORAL | Status: DC
  Administered 2013-10-14 – 2013-10-15 (×2): 100 mg via ORAL
  Filled 2013-10-14 (×2): qty 1

## 2013-10-14 MED ORDER — HYDROMORPHONE HCL PF 1 MG/ML IJ SOLN
1.0000 mg | Freq: Once | INTRAMUSCULAR | Status: AC
  Administered 2013-10-14: 1 mg via INTRAVENOUS
  Filled 2013-10-14: qty 1

## 2013-10-14 MED ORDER — ACETAMINOPHEN 500 MG PO TABS: 500.0000 mg | ORAL_TABLET | Freq: Four times a day (QID) | ORAL | Status: DC | PRN

## 2013-10-14 MED ORDER — KETOROLAC TROMETHAMINE 30 MG/ML IJ SOLN
30.0000 mg | Freq: Once | INTRAMUSCULAR | Status: AC
  Administered 2013-10-14: 30 mg via INTRAVENOUS
  Filled 2013-10-14: qty 1

## 2013-10-14 MED ORDER — INFLUENZA VAC SPLIT QUAD 0.5 ML IM SUSP
0.5000 mL | INTRAMUSCULAR | Status: DC
  Filled 2013-10-14: qty 0.5

## 2013-10-14 MED ORDER — ENOXAPARIN SODIUM 40 MG/0.4ML ~~LOC~~ SOLN
40.0000 mg | SUBCUTANEOUS | Status: DC
  Administered 2013-10-14 – 2013-10-16 (×2): 40 mg via SUBCUTANEOUS
  Filled 2013-10-14 (×2): qty 0.4

## 2013-10-14 MED ORDER — ONDANSETRON HCL 4 MG/2ML IJ SOLN
4.0000 mg | Freq: Three times a day (TID) | INTRAMUSCULAR | Status: AC | PRN
  Administered 2013-10-14 (×2): 4 mg via INTRAVENOUS
  Filled 2013-10-14 (×2): qty 2

## 2013-10-14 MED ORDER — HYDROMORPHONE HCL PF 1 MG/ML IJ SOLN
1.0000 mg | INTRAMUSCULAR | Status: DC | PRN
  Administered 2013-10-14 – 2013-10-15 (×3): 1 mg via INTRAVENOUS
  Filled 2013-10-14 (×3): qty 1

## 2013-10-14 MED ORDER — DEXTROSE 5 % IV SOLN
1.0000 g | INTRAVENOUS | Status: DC
  Administered 2013-10-14 – 2013-10-16 (×3): 1 g via INTRAVENOUS
  Filled 2013-10-14 (×4): qty 10

## 2013-10-14 MED ORDER — ONDANSETRON HCL 4 MG/2ML IJ SOLN
4.0000 mg | Freq: Three times a day (TID) | INTRAMUSCULAR | Status: DC | PRN
  Administered 2013-10-15 – 2013-10-16 (×3): 4 mg via INTRAVENOUS
  Filled 2013-10-14 (×4): qty 2

## 2013-10-14 MED ORDER — HYDROMORPHONE HCL PF 1 MG/ML IJ SOLN
1.0000 mg | INTRAMUSCULAR | Status: AC | PRN
  Administered 2013-10-14 (×3): 1 mg via INTRAVENOUS
  Filled 2013-10-14 (×3): qty 1

## 2013-10-14 MED ORDER — KETAMINE HCL 10 MG/ML IJ SOLN
5.0000 mg | Freq: Once | INTRAMUSCULAR | Status: AC
  Administered 2013-10-14: 5 mg via INTRAVENOUS
  Filled 2013-10-14: qty 1

## 2013-10-14 NOTE — Progress Notes (Signed)
[facility] PROGRESS NOTE  Chelsea [name] ZOX:[id number] DOB: 09-Jun-1991 DOA: 10/14/2013 PCP: No PCP Per Patient  Assessment/Plan: 1-Right ureteral stone with hydronephrosis  CT scan of the abdomen showed mild to moderate right-sided hydronephrosis with obstructing 9/8 mm stone proximally at right uteropelvic junction. Continue with IV fluids, pain management. Start IV ceftriaxone. Send urine culture. Urology consulted.   2+Anterior abdominal wall hernias  Patient has had colon surgery last year and now has abdominal wall hernias 2 of those contained loops of small bowel noted as of obstruction or stimulation.  Denies any new complaints.   3-Depression  Continue with seroquel and Celexa   4-Acute kidney failure.  Patient has elevated BUN/creatinine, secondary to hydronephrosis on right patient has left nonfunctioning kidney.  Continue with IV fluids.  Cr decrease at 1.39 from 1.43 Decrease urine out put. Check Bladder scan. If retention will probably need foley.   DVT prophylaxis  Lovenox   Code Status: Full Code.  Family Communication: Care discussed with patient.  Disposition Plan: remain inpatient.    Consultants:  urology  Procedures:  none  Antibiotics: Ceftriaxone 1-18.  HPI/Subjective: Still with flank pain. Relates dysuria. Difficulty with urination.   Objective: Filed Vitals:   10/14/13 0610  BP: 113/61  Pulse: 90  Temp:   Resp: 20   No intake or output data in the 24 hours ending 10/14/13 0939 Filed Weights   10/14/13 0152  Weight: 108.863 kg (240 lb)    Exam:   General:  No distress.   Cardiovascular: S 1, S 2 RRR  Respiratory: CTA  Abdomen: BS present, soft, NT  Musculoskeletal: no edema.   Data Reviewed: Basic Metabolic Panel:  Recent Labs Lab 10/14/13 0224  NA 140  K 3.7  CL 110  CO2 18*  GLUCOSE 87  BUN 12  CREATININE 1.43*  CALCIUM 8.3*   Liver Function Tests: No results found for this basename: AST,  ALT, ALKPHOS, BILITOT, PROT, ALBUMIN,  in the last 168 hours No results found for this basename: LIPASE, AMYLASE,  in the last 168 hours No results found for this basename: AMMONIA,  in the last 168 hours CBC:  Recent Labs Lab 10/14/13 0224  WBC 7.8  HGB 9.8*  HCT 31.9*  MCV 82.0  PLT 223   Cardiac Enzymes: No results found for this basename: CKTOTAL, CKMB, CKMBINDEX, TROPONINI,  in the last 168 hours BNP (last 3 results) No results found for this basename: PROBNP,  in the last 8760 hours CBG: No results found for this basename: GLUCAP,  in the last 168 hours  No results found for this or any previous visit (from the past 240 hour(s)).   Studies: Ct Abdomen Pelvis Wo Contrast  10/14/2013   CLINICAL DATA:  Right-sided flank pain.  EXAM: CT ABDOMEN AND PELVIS WITHOUT CONTRAST  TECHNIQUE: Multidetector CT imaging of the abdomen and pelvis was performed following the standard protocol without intravenous contrast.  COMPARISON:  CT of the abdomen and pelvis performed 05/05/2013  FINDINGS: The visualized lung bases are clear.  The liver and spleen are unremarkable in appearance. The gallbladder is within normal limits. The pancreas and adrenal glands are unremarkable.  There is mild to moderate right-sided hydronephrosis, with an obstructing 9 x 8 mm stone noted proximally at the right ureteropelvic junction. Diffuse medullary nephrocalcinosis is noted. There is mild associated cortical thinning. Marked left renal atrophy is seen, with associated calcification.  No free fluid is identified. The small bowel is unremarkable in appearance. The stomach  is within normal limits. No acute vascular abnormalities are seen.  The patient is status post appendectomy. A bowel suture line is noted at the hepatic flexure. The colon is unremarkable in appearance.  Several anterior abdominal hernias are seen, with two broad-based hernias more superiorly, and an additional periumbilical hernia more inferiorly. Two  contain loops of small bowel, and the most superior hernia contains a segment of transverse colon. There is no evidence of obstruction or strangulation.  The bladder is mildly distended and grossly unremarkable in appearance. The uterus is unremarkable in appearance. The ovaries are relatively symmetric; no suspicious adnexal masses are seen. A borderline prominent left inguinal node is seen, measuring 1.2 cm in short axis; this appears to be stable and reflects the patient's baseline.  No acute osseous abnormalities are identified.  IMPRESSION: 1. Mild to moderate right-sided hydronephrosis, with an obstructing 9 x 8 mm mm stone noted proximally at the right ureteropelvic junction. 2. Diffuse medullary nephrocalcinosis noted, with mild cortical thinning. Marked left renal atrophy seen, with associated calcification. 3. Three anterior abdominal wall hernias seen; two of these contain loops of small bowel, while the most superior hernia contains a segment of transverse colon. No evidence of obstruction or strangulation.   Electronically Signed   By: Roanna RaiderJeffery  Chang M.D.   On: 10/14/2013 05:01    Scheduled Meds: . cefTRIAXone (ROCEPHIN)  IV  1 g Intravenous Q24H  . citalopram  40 mg Oral Daily  . docusate sodium  100 mg Oral BID  . enoxaparin (LOVENOX) injection  40 mg Subcutaneous Q24H  . [START ON 10/15/2013] influenza vac split quadrivalent PF  0.5 mL Intramuscular Tomorrow-1000  . polyethylene glycol  17 g Oral BID  . QUEtiapine  100 mg Oral QHS   Continuous Infusions: . sodium chloride 125 mL/hr at 10/14/13 [id number]    Principal Problem:   Right ureteral stone Active Problems:   ARF (acute renal failure)   RTA (renal tubular acidosis)   Kidney stone on right side    Time spent: 35 minutes.     Chelsea [name]  [facility] Pager [phone]. If 7PM-7AM, please contact night-coverage at [email or web address], password [email or web address] 10/14/2013, 9:39 AM  LOS: 0 days

## 2013-10-14 NOTE — ED Notes (Signed)
Patient rates pain as 8/10.  Patient informed that Dr. Dierdre Highmanpitz has consulted urology.

## 2013-10-14 NOTE — ED Provider Notes (Signed)
CSN: [id number]     Arrival date & time 10/14/13  0142 History   First MD Initiated Contact with Patient 10/14/13 0204     Chief Complaint  Patient presents with  . Flank Pain   (Consider location/radiation/quality/duration/timing/severity/associated sxs/prior Treatment) HPI History provided by patient. History of kidney stones and states left kidney nonfunctioning. She is from [place] where she has a Insurance underwriterurologist.  She's been having flank pain for the last few days. She was evaluated yesterday morning in [place] at the emergency room. She had an ultrasound and was told that she has a kidney stone on the right with a "blockage".  She was discharged home and has scheduled followup with her urologist next week. Tonight her pain got worse and she is having associated dysuria, and nausea and vomiting. She has not noticed any hematuria. She denies any fevers or chills. Pain is sharp in quality and moderate to severe at times.   Past Medical History  Diagnosis Date  . Kidney stones   . Rickets   . Constipation    Past Surgical History  Procedure Laterality Date  . Ureteral stents    . Has only 1 functioning  kidney    . Colon surgery    . Appendectomy    . Leg surgery    . Extracorporeal shock wave lithotripsy Right 03/21/2013    Procedure: EXTRACORPOREAL SHOCK WAVE LITHOTRIPSY (ESWL) RIGHT URETERAL CALCULUS;  Surgeon: Ky BarbanMohammad I Javaid, MD;  Location: [facility];  Service: Urology;  Laterality: Right;   Family History  Problem Relation Age of Onset  . Kidney disease Father    History  Substance Use Topics  . Smoking status: Never Smoker   . Smokeless tobacco: Not on file  . Alcohol Use: No   OB History   Grav Para Term Preterm Abortions TAB SAB Ect Mult Living                 Review of Systems  Constitutional: Negative for fever and chills.  Respiratory: Negative for shortness of breath.   Cardiovascular: Negative for chest pain.  Gastrointestinal: Negative for abdominal pain.   Genitourinary: Positive for dysuria and flank pain.  Musculoskeletal: Negative for back pain, neck pain and neck stiffness.  Skin: Negative for rash.  Neurological: Negative for headaches.  All other systems reviewed and are negative.    Allergies  Review of patient's allergies indicates no known allergies.  Home Medications   Current Outpatient Rx  Name  Route  Sig  Dispense  Refill  . citalopram (CELEXA) 40 MG tablet   Oral   Take 40 mg by mouth daily.         Marland Kitchen. docusate sodium (COLACE) 100 MG capsule   Oral   Take 100 mg by mouth 2 (two) times daily.         . phenazopyridine (PYRIDIUM) 200 MG tablet   Oral   Take 200 mg by mouth 3 (three) times daily as needed for pain.         . potassium citrate (UROCIT-K) 10 MEQ (1080 MG) SR tablet   Oral   Take 10 mEq by mouth 3 (three) times daily with meals.         Marland Kitchen. QUEtiapine (SEROQUEL) 100 MG tablet   Oral   Take 100 mg by mouth at bedtime.         . ranitidine (ZANTAC) 150 MG tablet   Oral   Take 150 mg by mouth 2 (two) times daily.         .Marland Kitchen  acetaminophen (TYLENOL) 500 MG tablet   Oral   Take 500-1,000 mg by mouth every 6 (six) hours as needed for pain.         . cephALEXin (KEFLEX) 500 MG capsule   Oral   Take 1 capsule (500 mg total) by mouth 4 (four) times daily.   28 capsule   0   . hydrOXYzine (ATARAX/VISTARIL) 25 MG tablet   Oral   Take 25 mg by mouth at bedtime as needed (sleep).         . Lurasidone HCl (LATUDA) 20 MG TABS   Oral   Take 1 tablet by mouth daily.          BP 107/74  Pulse 105  Temp(Src) 97.7 F (36.5 C) (Oral)  Resp 22  Ht 4\' 10"  (1.473 m)  Wt 240 lb (108.863 kg)  BMI 50.17 kg/m2  SpO2 95%  LMP 05/14/2013 Physical Exam  Constitutional: She is oriented to person, place, and time. She appears well-developed and well-nourished.  HENT:  Head: Normocephalic and atraumatic.  Eyes: EOM are normal. Pupils are equal, round, and reactive to light.  Neck: Neck  supple.  Cardiovascular: Regular rhythm and intact distal pulses.   Tachycardic 105  Pulmonary/Chest: Effort normal and breath sounds normal. No respiratory distress.  Abdominal: Soft. Bowel sounds are normal. She exhibits no distension. There is no rebound and no guarding.  Some tenderness to the right flank without CVA tenderness. No abdominal tenderness otherwise with deep palpation throughout  Musculoskeletal: Normal range of motion. She exhibits no edema.  Neurological: She is alert and oriented to person, place, and time.  Skin: Skin is warm and dry.    ED Course  Procedures (including critical care time) Labs Review Labs Reviewed  URINALYSIS, ROUTINE W REFLEX MICROSCOPIC - Abnormal; Notable for the following:    Specific Gravity, Urine <1.005 (*)    Hgb urine dipstick LARGE (*)    Leukocytes, UA SMALL (*)    All other components within normal limits  CBC - Abnormal; Notable for the following:    Hemoglobin 9.8 (*)    HCT 31.9 (*)    MCH 25.2 (*)    RDW 17.1 (*)    All other components within normal limits  BASIC METABOLIC PANEL - Abnormal; Notable for the following:    CO2 18 (*)    Creatinine, Ser 1.43 (*)    Calcium 8.3 (*)    GFR calc non Af Amer 52 (*)    GFR calc Af Amer 60 (*)    All other components within normal limits  PREGNANCY, URINE  URINE MICROSCOPIC-ADD ON   Imaging Review Ct Abdomen Pelvis Wo Contrast  10/14/2013   CLINICAL DATA:  Right-sided flank pain.  EXAM: CT ABDOMEN AND PELVIS WITHOUT CONTRAST  TECHNIQUE: Multidetector CT imaging of the abdomen and pelvis was performed following the standard protocol without intravenous contrast.  COMPARISON:  CT of the abdomen and pelvis performed 05/05/2013  FINDINGS: The visualized lung bases are clear.  The liver and spleen are unremarkable in appearance. The gallbladder is within normal limits. The pancreas and adrenal glands are unremarkable.  There is mild to moderate right-sided hydronephrosis, with an  obstructing 9 x 8 mm stone noted proximally at the right ureteropelvic junction. Diffuse medullary nephrocalcinosis is noted. There is mild associated cortical thinning. Marked left renal atrophy is seen, with associated calcification.  No free fluid is identified. The small bowel is unremarkable in appearance. The stomach is within normal limits.  No acute vascular abnormalities are seen.  The patient is status post appendectomy. A bowel suture line is noted at the hepatic flexure. The colon is unremarkable in appearance.  Several anterior abdominal hernias are seen, with two broad-based hernias more superiorly, and an additional periumbilical hernia more inferiorly. Two contain loops of small bowel, and the most superior hernia contains a segment of transverse colon. There is no evidence of obstruction or strangulation.  The bladder is mildly distended and grossly unremarkable in appearance. The uterus is unremarkable in appearance. The ovaries are relatively symmetric; no suspicious adnexal masses are seen. A borderline prominent left inguinal node is seen, measuring 1.2 cm in short axis; this appears to be stable and reflects the patient's baseline.  No acute osseous abnormalities are identified.  IMPRESSION: 1. Mild to moderate right-sided hydronephrosis, with an obstructing 9 x 8 mm mm stone noted proximally at the right ureteropelvic junction. 2. Diffuse medullary nephrocalcinosis noted, with mild cortical thinning. Marked left renal atrophy seen, with associated calcification. 3. Three anterior abdominal wall hernias seen; two of these contain loops of small bowel, while the most superior hernia contains a segment of transverse colon. No evidence of obstruction or strangulation.   Electronically Signed   By: Roanna Raider M.D.   On: 10/14/2013 05:01    IV Dilaudid. IV Zofran, Repeated Dilaudid for persistent pain x 2. CT reviewed as above and urology consult requested.   5:48 AM d/w Urology DR Jerre Simon.  He recommends admit to hospitalist service and he will see her today. Dr Sharl Ma agrees to admit. Still having a sig amount of pain. IV Ketamine provided which seemed to help her pain quite a bit.  MDM  Diagnosis: Right sided obstructing ureteral stone with nonfunctioning left kidney.   Treated with IV fluids, IV narcotics, IV Zofran. Evaluated with labs and urinalysis and CT scan reviewed as above. Urology consult - will see PT for stone removal.  MED admit.    Sunnie Nielsen, MD 10/14/13 0630

## 2013-10-14 NOTE — H&P (Addendum)
PCP:   No PCP Per Patient   Chief Complaint:  Right flank pain  HPI:  23 year old female who  has a past medical history of Kidney stones; Rickets; and Constipation. today presented to the ED with right flank pain. Patient has history of kidney stones and has left nonfunctioning kidney. Patient has been having right flank pain for last few days and she was seen at ED at [facility] the ultrasound showed that she had kidney stone the right with a blockage. Patient was discharged to follow up with urologist next week but today patient's pain got worse and she came to the ED she is also having associated dysuria nausea vomiting. The pain she rates is 8/10 in intensity which radiates to the groin. She denies chest pain, no shortness of breath.   Allergies:  No Known Allergies    Past Medical History  Diagnosis Date  . Kidney stones   . Rickets   . Constipation     Past Surgical History  Procedure Laterality Date  . Ureteral stents    . Has only 1 functioning  kidney    . Colon surgery    . Appendectomy    . Leg surgery    . Extracorporeal shock wave lithotripsy Right 03/21/2013    Procedure: EXTRACORPOREAL SHOCK WAVE LITHOTRIPSY (ESWL) RIGHT URETERAL CALCULUS;  Surgeon: Marissa Nestle, MD;  Location: [facility];  Service: Urology;  Laterality: Right;    Prior to Admission medications   Medication Sig Start Date End Date Taking? Authorizing Provider  citalopram (CELEXA) 40 MG tablet Take 40 mg by mouth daily.   Yes Historical Provider, MD  docusate sodium (COLACE) 100 MG capsule Take 100 mg by mouth 2 (two) times daily.   Yes Historical Provider, MD  phenazopyridine (PYRIDIUM) 200 MG tablet Take 200 mg by mouth 3 (three) times daily as needed for pain.   Yes Historical Provider, MD  potassium citrate (UROCIT-K) 10 MEQ (1080 MG) SR tablet Take 10 mEq by mouth 3 (three) times daily with meals.   Yes Historical Provider, MD  QUEtiapine (SEROQUEL) 100 MG tablet Take 100 mg by mouth  at bedtime.   Yes Historical Provider, MD  ranitidine (ZANTAC) 150 MG tablet Take 150 mg by mouth 2 (two) times daily.   Yes Historical Provider, MD  acetaminophen (TYLENOL) 500 MG tablet Take 500-1,000 mg by mouth every 6 (six) hours as needed for pain.    Historical Provider, MD  cephALEXin (KEFLEX) 500 MG capsule Take 1 capsule (500 mg total) by mouth 4 (four) times daily. 08/17/13   Maudry Diego, MD  hydrOXYzine (ATARAX/VISTARIL) 25 MG tablet Take 25 mg by mouth at bedtime as needed (sleep).    Historical Provider, MD  Lurasidone HCl (LATUDA) 20 MG TABS Take 1 tablet by mouth daily.    Historical Provider, MD    Social History:  reports that she has never smoked. She does not have any smokeless tobacco history on file. She reports that she does not drink alcohol or use illicit drugs.  Family History  Problem Relation Age of Onset  . Kidney disease Father      All the positives are listed in BOLD  Review of Systems:  HEENT: Headache, blurred vision, runny nose, sore throat Neck: Hypothyroidism, hyperthyroidism,,lymphadenopathy Chest : Shortness of breath, history of COPD, Asthma Heart : Chest pain, history of coronary arterey disease GI:  Nausea, vomiting, diarrhea, constipation, GERD GU: Dysuria, urgency, frequency of urination, hematuria Neuro: Stroke, seizures, syncope  Psych: Depression, anxiety, hallucinations   Physical Exam: Blood pressure 113/61, pulse 90, temperature 97.7 F (36.5 C), temperature source Oral, resp. rate 20, height 4' 10"  (1.473 m), weight 108.863 kg (240 lb), last menstrual period 05/14/2013, SpO2 97.00%. Constitutional:   Patient is a morbidly obese female in no acute distress and cooperative with exam. Head: Normocephalic and atraumatic Mouth: Mucus membranes moist Eyes: PERRL, EOMI, conjunctivae normal Neck: Supple, No Thyromegaly Cardiovascular: RRR, S1 normal, S2 normal Pulmonary/Chest: CTAB, no wheezes, rales, or rhonchi Abdominal: Soft.  Positive tenderness to palpation in the right flank, non-distended, bowel sounds are normal, no masses, organomegaly, or guarding present.  Neurological: A&O x3, Strenght is normal and symmetric bilaterally, cranial nerve II-XII are grossly intact, no focal motor deficit, sensory intact to light touch bilaterally.  Extremities : No Cyanosis, Clubbing or Edema   Labs on Admission:  Results for orders placed during the hospital encounter of 10/14/13 (from the past 48 hour(s))  CBC     Status: Abnormal   Collection Time    10/14/13  2:24 AM      Result Value Range   WBC 7.8  4.0 - 10.5 K/uL   RBC 3.89  3.87 - 5.11 MIL/uL   Hemoglobin 9.8 (*) 12.0 - 15.0 g/dL   HCT 31.9 (*) 36.0 - 46.0 %   MCV 82.0  78.0 - 100.0 fL   MCH 25.2 (*) 26.0 - 34.0 pg   MCHC 30.7  30.0 - 36.0 g/dL   RDW 17.1 (*) 11.5 - 15.5 %   Platelets 223  150 - 400 K/uL  BASIC METABOLIC PANEL     Status: Abnormal   Collection Time    10/14/13  2:24 AM      Result Value Range   Sodium 140  137 - 147 mEq/L   Potassium 3.7  3.7 - 5.3 mEq/L   Chloride 110  96 - 112 mEq/L   CO2 18 (*) 19 - 32 mEq/L   Glucose, Bld 87  70 - 99 mg/dL   BUN 12  6 - 23 mg/dL   Creatinine, Ser 1.43 (*) 0.50 - 1.10 mg/dL   Calcium 8.3 (*) 8.4 - 10.5 mg/dL   GFR calc non Af Amer 52 (*) >90 mL/min   GFR calc Af Amer 60 (*) >90 mL/min   Comment: (NOTE)     The eGFR has been calculated using the CKD EPI equation.     This calculation has not been validated in all clinical situations.     eGFR's persistently <90 mL/min signify possible Chronic Kidney     Disease.  URINALYSIS, ROUTINE W REFLEX MICROSCOPIC     Status: Abnormal   Collection Time    10/14/13  2:54 AM      Result Value Range   Color, Urine YELLOW  YELLOW   APPearance CLEAR  CLEAR   Specific Gravity, Urine <1.005 (*) 1.005 - 1.030   pH 7.0  5.0 - 8.0   Glucose, UA NEGATIVE  NEGATIVE mg/dL   Hgb urine dipstick LARGE (*) NEGATIVE   Bilirubin Urine NEGATIVE  NEGATIVE   Ketones, ur  NEGATIVE  NEGATIVE mg/dL   Protein, ur NEGATIVE  NEGATIVE mg/dL   Urobilinogen, UA 0.2  0.0 - 1.0 mg/dL   Nitrite NEGATIVE  NEGATIVE   Leukocytes, UA SMALL (*) NEGATIVE  PREGNANCY, URINE     Status: None   Collection Time    10/14/13  2:54 AM      Result Value Range  Preg Test, Ur NEGATIVE  NEGATIVE  URINE MICROSCOPIC-ADD ON     Status: None   Collection Time    10/14/13  2:54 AM      Result Value Range   Squamous Epithelial / LPF RARE  RARE   WBC, UA 3-6  <3 WBC/hpf   RBC / HPF 0-2  <3 RBC/hpf   Bacteria, UA RARE  RARE    Radiological Exams on Admission: Ct Abdomen Pelvis Wo Contrast  10/14/2013   CLINICAL DATA:  Right-sided flank pain.  EXAM: CT ABDOMEN AND PELVIS WITHOUT CONTRAST  TECHNIQUE: Multidetector CT imaging of the abdomen and pelvis was performed following the standard protocol without intravenous contrast.  COMPARISON:  CT of the abdomen and pelvis performed 05/05/2013  FINDINGS: The visualized lung bases are clear.  The liver and spleen are unremarkable in appearance. The gallbladder is within normal limits. The pancreas and adrenal glands are unremarkable.  There is mild to moderate right-sided hydronephrosis, with an obstructing 9 x 8 mm stone noted proximally at the right ureteropelvic junction. Diffuse medullary nephrocalcinosis is noted. There is mild associated cortical thinning. Marked left renal atrophy is seen, with associated calcification.  No free fluid is identified. The small bowel is unremarkable in appearance. The stomach is within normal limits. No acute vascular abnormalities are seen.  The patient is status post appendectomy. A bowel suture line is noted at the hepatic flexure. The colon is unremarkable in appearance.  Several anterior abdominal hernias are seen, with two broad-based hernias more superiorly, and an additional periumbilical hernia more inferiorly. Two contain loops of small bowel, and the most superior hernia contains a segment of transverse  colon. There is no evidence of obstruction or strangulation.  The bladder is mildly distended and grossly unremarkable in appearance. The uterus is unremarkable in appearance. The ovaries are relatively symmetric; no suspicious adnexal masses are seen. A borderline prominent left inguinal node is seen, measuring 1.2 cm in short axis; this appears to be stable and reflects the patient's baseline.  No acute osseous abnormalities are identified.  IMPRESSION: 1. Mild to moderate right-sided hydronephrosis, with an obstructing 9 x 8 mm mm stone noted proximally at the right ureteropelvic junction. 2. Diffuse medullary nephrocalcinosis noted, with mild cortical thinning. Marked left renal atrophy seen, with associated calcification. 3. Three anterior abdominal wall hernias seen; two of these contain loops of small bowel, while the most superior hernia contains a segment of transverse colon. No evidence of obstruction or strangulation.   Electronically Signed   By: Garald Balding M.D.   On: 10/14/2013 05:01    Assessment/Plan Principal Problem:   Right ureteral stone Active Problems:   ARF (acute renal failure)   RTA (renal tubular acidosis)   Kidney stone on right side  Right ureteral stone with hydronephrosis CT scan of the abdomen showed mild to moderate right-sided hydronephrosis with obstructing 9/8 mm stone proximally at right uteropelvic junction. Patient will be started on IV fluids and Dilaudid for pain control. Urology has orally been consulted and will see the patient in the morning.  Anterior abdominal wall hernias Patient has had colon surgery last year and now has abdominal wall hernias 2 of those contained loops of small bowel noted as of obstruction or stimulation.  Depression Continue with seroquel and Celexa  Acute kidney injury Patient has elevated BUN/creatinine, secondary to hydronephrosis on right patient has left nonfunctioning kidney. We'll start the patient on IV fluids and  check BMP in the morning.  DVT  prophylaxis Lovenox  Code status: Presumed full code  Family discussion: Discussed with family at bedside   Time Spent on Admission: 60 min  [facility] Pager: [phone] 10/14/2013, 6:19 AM  If 7PM-7AM, please contact night-coverage  [email or web address]  Password [email or web address]

## 2013-10-14 NOTE — Consult Note (Signed)
Admitted thru er last nite with r renal colic ct shows 8 mm calculus in r upj causing partial obstruction.admitted fo futher manage ment consut note dictated will insert r ureteral stent am under anestesia the dc home follow wih esl as out patient.

## 2013-10-14 NOTE — Consult Note (Signed)
cosult note dictated [id number]

## 2013-10-14 NOTE — ED Notes (Signed)
Patient c/o continued pain; requesting additional pain medication.

## 2013-10-14 NOTE — ED Notes (Signed)
Pt c/o right sided flank pain. States she [facility] and diagnosed with a blockage and kidney stones. States the pain has gotten worse and it hurts worse when she urinates. Reports seeing blood in her urine. C/o N/V.

## 2013-10-14 NOTE — Progress Notes (Signed)
Bladder scan revealed .  Pt states she tried to void about an hour ago wand was unable.

## 2013-10-14 NOTE — ED Notes (Signed)
Patient requesting additional pain medication.  Dr. Dierdre Highmanpitz aware; states he will look at CT scan and order.

## 2013-10-15 ENCOUNTER — Encounter (HOSPITAL_COMMUNITY): Payer: Self-pay | Admitting: Emergency Medicine

## 2013-10-15 ENCOUNTER — Encounter (HOSPITAL_COMMUNITY): Payer: Medicaid - Out of State | Admitting: Anesthesiology

## 2013-10-15 ENCOUNTER — Inpatient Hospital Stay (HOSPITAL_COMMUNITY): Payer: Medicaid - Out of State | Admitting: Anesthesiology

## 2013-10-15 ENCOUNTER — Inpatient Hospital Stay (HOSPITAL_COMMUNITY): Payer: Medicaid - Out of State

## 2013-10-15 ENCOUNTER — Encounter (HOSPITAL_COMMUNITY)
Admission: EM | Disposition: A | Discharge status: Home / Self Care | Payer: Self-pay | Source: Home / Self Care | Attending: Internal Medicine

## 2013-10-15 DIAGNOSIS — N1330 Unspecified hydronephrosis: Secondary | ICD-10-CM

## 2013-10-15 DIAGNOSIS — N183 Chronic kidney disease, stage 3 unspecified: Secondary | ICD-10-CM

## 2013-10-15 DIAGNOSIS — N289 Disorder of kidney and ureter, unspecified: Secondary | ICD-10-CM

## 2013-10-15 DIAGNOSIS — N399 Disorder of urinary system, unspecified: Secondary | ICD-10-CM | POA: Diagnosis present

## 2013-10-15 HISTORY — PX: CYSTOSCOPY W/ URETERAL STENT PLACEMENT: SHX1429

## 2013-10-15 LAB — URINE CULTURE
COLONY COUNT: NO GROWTH
CULTURE: NO GROWTH

## 2013-10-15 LAB — BASIC METABOLIC PANEL
BUN: 14 mg/dL (ref 6–23)
CO2: 17 meq/L — AB (ref 19–32)
CREATININE: 1.42 mg/dL — AB (ref 0.50–1.10)
Calcium: 8.3 mg/dL — ABNORMAL LOW (ref 8.4–10.5)
Chloride: 116 mEq/L — ABNORMAL HIGH (ref 96–112)
GFR calc Af Amer: 61 mL/min — ABNORMAL LOW (ref >90)
GFR, EST NON AFRICAN AMERICAN: 52 mL/min — AB (ref >90)
GLUCOSE: 88 mg/dL (ref 70–99)
Potassium: 4.3 mEq/L (ref 3.7–5.3)
Sodium: 143 mEq/L (ref 137–147)

## 2013-10-15 LAB — CBC
HCT: 31.4 % — ABNORMAL LOW (ref 36.0–46.0)
Hemoglobin: 9.6 g/dL — ABNORMAL LOW (ref 12.0–15.0)
MCH: 25.3 pg — ABNORMAL LOW (ref 26.0–34.0)
MCHC: 30.6 g/dL (ref 30.0–36.0)
MCV: 82.8 fL (ref 78.0–100.0)
PLATELETS: 187 10*3/uL (ref 150–400)
RBC: 3.79 MIL/uL — ABNORMAL LOW (ref 3.87–5.11)
RDW: 17.1 % — AB (ref 11.5–15.5)
WBC: 5.9 10*3/uL (ref 4.0–10.5)

## 2013-10-15 SURGERY — CYSTOSCOPY, WITH RETROGRADE PYELOGRAM AND URETERAL STENT INSERTION
Anesthesia: Monitor Anesthesia Care | Laterality: Right

## 2013-10-15 MED ORDER — ONDANSETRON HCL 4 MG/2ML IJ SOLN: 4.0000 mg | Freq: Once | INTRAMUSCULAR | Status: DC | PRN

## 2013-10-15 MED ORDER — IOHEXOL 350 MG/ML SOLN
INTRAVENOUS | Status: DC | PRN
  Administered 2013-10-15: 50 mL

## 2013-10-15 MED ORDER — OXYCODONE-ACETAMINOPHEN 7.5-325 MG PO TABS: 1.0000 | ORAL_TABLET | Freq: Four times a day (QID) | ORAL | Status: DC | PRN

## 2013-10-15 MED ORDER — MIDAZOLAM HCL 2 MG/2ML IJ SOLN
1.0000 mg | INTRAMUSCULAR | Status: DC | PRN
  Administered 2013-10-15: 2 mg via INTRAVENOUS

## 2013-10-15 MED ORDER — DEXTROSE-NACL 5-0.45 % IV SOLN
INTRAVENOUS | Status: DC
  Administered 2013-10-15 – 2013-10-16 (×2): via INTRAVENOUS

## 2013-10-15 MED ORDER — HYDROMORPHONE HCL PF 1 MG/ML IJ SOLN
1.0000 mg | INTRAMUSCULAR | Status: DC | PRN
  Administered 2013-10-16 (×2): 1 mg via INTRAVENOUS
  Filled 2013-10-15 (×3): qty 1

## 2013-10-15 MED ORDER — FENTANYL CITRATE 0.05 MG/ML IJ SOLN
INTRAMUSCULAR | Status: AC
  Filled 2013-10-15: qty 5

## 2013-10-15 MED ORDER — LIDOCAINE VISCOUS 2 % MT SOLN
OROMUCOSAL | Status: AC
  Filled 2013-10-15: qty 15

## 2013-10-15 MED ORDER — SODIUM CHLORIDE 0.9 % IR SOLN
Status: DC | PRN
Start: 2013-10-15 — End: 2013-10-15
  Administered 2013-10-15 (×2): 3000 mL

## 2013-10-15 MED ORDER — FENTANYL CITRATE 0.05 MG/ML IJ SOLN
25.0000 ug | INTRAMUSCULAR | Status: AC
  Administered 2013-10-15 (×2): via INTRAVENOUS

## 2013-10-15 MED ORDER — LACTATED RINGERS IV SOLN
INTRAVENOUS | Status: DC | PRN
  Administered 2013-10-15: 12:00:00 via INTRAVENOUS

## 2013-10-15 MED ORDER — CIPROFLOXACIN IN D5W 400 MG/200ML IV SOLN
400.0000 mg | INTRAVENOUS | Status: AC
  Administered 2013-10-15: 400 mg via INTRAVENOUS
  Filled 2013-10-15: qty 200

## 2013-10-15 MED ORDER — MIDAZOLAM HCL 2 MG/2ML IJ SOLN
INTRAMUSCULAR | Status: AC
  Filled 2013-10-15: qty 2

## 2013-10-15 MED ORDER — MIDAZOLAM HCL 5 MG/5ML IJ SOLN
INTRAMUSCULAR | Status: DC | PRN
  Administered 2013-10-15: 2 mg via INTRAVENOUS

## 2013-10-15 MED ORDER — GLYCOPYRROLATE 0.2 MG/ML IJ SOLN
0.2000 mg | Freq: Once | INTRAMUSCULAR | Status: AC
  Administered 2013-10-15: 0.2 mg via INTRAVENOUS

## 2013-10-15 MED ORDER — FENTANYL CITRATE 0.05 MG/ML IJ SOLN
INTRAMUSCULAR | Status: DC | PRN
  Administered 2013-10-15: 25 ug via INTRAVENOUS
  Administered 2013-10-15: 50 ug via INTRAVENOUS
  Administered 2013-10-15 (×5): 25 ug via INTRAVENOUS

## 2013-10-15 MED ORDER — PROPOFOL 10 MG/ML IV BOLUS
INTRAVENOUS | Status: AC
Start: 2013-10-15 — End: 2013-10-15
  Filled 2013-10-15: qty 20

## 2013-10-15 MED ORDER — LIDOCAINE HCL (PF) 1 % IJ SOLN
INTRAMUSCULAR | Status: AC
  Filled 2013-10-15: qty 5

## 2013-10-15 MED ORDER — FENTANYL CITRATE 0.05 MG/ML IJ SOLN: 25.0000 ug | INTRAMUSCULAR | Status: DC | PRN

## 2013-10-15 MED ORDER — HYDROMORPHONE HCL PF 1 MG/ML IJ SOLN
1.0000 mg | INTRAMUSCULAR | Status: DC | PRN
  Administered 2013-10-15: 1 mg via INTRAVENOUS
  Filled 2013-10-15 (×2): qty 1

## 2013-10-15 MED ORDER — LACTATED RINGERS IV SOLN
INTRAVENOUS | Status: DC
  Administered 2013-10-15: 12:00:00 via INTRAVENOUS

## 2013-10-15 MED ORDER — PROPOFOL 10 MG/ML IV EMUL
INTRAVENOUS | Status: AC
  Filled 2013-10-15: qty 20

## 2013-10-15 MED ORDER — PROPOFOL INFUSION 10 MG/ML OPTIME
INTRAVENOUS | Status: DC | PRN
  Administered 2013-10-15: 50 ug/kg/min via INTRAVENOUS

## 2013-10-15 MED ORDER — PROPOFOL 10 MG/ML IV BOLUS
INTRAVENOUS | Status: AC
  Filled 2013-10-15: qty 20

## 2013-10-15 MED ORDER — OXYCODONE-ACETAMINOPHEN 5-325 MG PO TABS
1.0000 | ORAL_TABLET | ORAL | Status: DC | PRN
  Administered 2013-10-15: 2 via ORAL
  Administered 2013-10-16: 1 via ORAL
  Filled 2013-10-15: qty 1
  Filled 2013-10-15: qty 2

## 2013-10-15 MED ORDER — STERILE WATER FOR IRRIGATION IR SOLN
Status: DC | PRN
  Administered 2013-10-15: 1000 mL

## 2013-10-15 MED ORDER — HYDROMORPHONE HCL PF 1 MG/ML IJ SOLN
1.0000 mg | Freq: Once | INTRAMUSCULAR | Status: AC
  Administered 2013-10-15: 1 mg via INTRAVENOUS

## 2013-10-15 SURGICAL SUPPLY — 26 items
BAG DRAIN URO TABLE W/ADPT NS (DRAPE) ×3 IMPLANT
BAG HAMPER (MISCELLANEOUS) ×3 IMPLANT
CATH 5 FR WEDGE TIP (UROLOGICAL SUPPLIES) ×3 IMPLANT
CLOTH BEACON ORANGE TIMEOUT ST (SAFETY) ×3 IMPLANT
DECANTER SPIKE VIAL GLASS SM (MISCELLANEOUS) ×3 IMPLANT
DILATOR BALLN URETERAL SET (BALLOONS) IMPLANT
FLOOR PAD 36X40 (MISCELLANEOUS) ×3
GLOVE BIO SURGEON STRL SZ7 (GLOVE) ×3 IMPLANT
GLOVE BIOGEL PI IND STRL 7.0 (GLOVE) ×1 IMPLANT
GLOVE BIOGEL PI INDICATOR 7.0 (GLOVE) ×2
GLOVE EXAM NITRILE MD LF STRL (GLOVE) ×3 IMPLANT
GLOVE SS BIOGEL STRL SZ 6.5 (GLOVE) ×1 IMPLANT
GLOVE SUPERSENSE BIOGEL SZ 6.5 (GLOVE) ×2
GOWN STRL REUS W/TWL LRG LVL3 (GOWN DISPOSABLE) ×3 IMPLANT
IV NS IRRIG 3000ML ARTHROMATIC (IV SOLUTION) ×6 IMPLANT
KIT ROOM TURNOVER AP CYSTO (KITS) ×3 IMPLANT
MANIFOLD NEPTUNE II (INSTRUMENTS) ×3 IMPLANT
PACK CYSTO (CUSTOM PROCEDURE TRAY) ×3 IMPLANT
PAD ARMBOARD 7.5X6 YLW CONV (MISCELLANEOUS) ×3 IMPLANT
PAD FLOOR 36X40 (MISCELLANEOUS) ×1 IMPLANT
SET IRRIGATING DISP (SET/KITS/TRAYS/PACK) ×3 IMPLANT
STENT PERCUFLEX 4.8FRX24 (STENTS) IMPLANT
STENT PERCUFLEX 4.8FRX26 (STENTS) ×3 IMPLANT
TOWEL OR 17X26 4PK STRL BLUE (TOWEL DISPOSABLE) ×3 IMPLANT
WATER STERILE IRR 1000ML POUR (IV SOLUTION) ×3 IMPLANT
WIRE GUIDE BENTSON .035 15CM (WIRE) ×3 IMPLANT

## 2013-10-15 NOTE — Brief Op Note (Signed)
10/14/2013 - 10/15/2013  1:37 PM  PATIENT:  Chelsea [name]  23 y.o. female  PRE-OPERATIVE DIAGNOSIS:  Right renal calculus within the ureteropelvic junction, size 8 to 9 mm, with partial obstruction; hydronephrosis; nonfunctioning left kidney  POST-OPERATIVE DIAGNOSIS:  Right renal calculus within the ureteropelvic junction, size 8 to 9 mm, with partial obstruction; hydronephrosis; nonfunctioning left kidney  PROCEDURE:  Procedure(s): CYSTOSCOPY WITH RETROGRADE PYELOGRAM/URETERAL STENT PLACEMENT (Right)  SURGEON:  Surgeon(s) and Role:    * Ky BarbanMohammad I Bettylou Frew, MD - Primary  PHYSICIAN ASSISTANT:   ASSISTANTS: none   ANESTHESIA:   MAC  EBL:  Total I/O In: 750 [I.V.:700; IV Piggyback:50] Out: 1800 [Urine:1800]  BLOOD ADMINISTERED:none  DRAINS: double j stent size f5 26 cmdictation note#   LOCAL MEDICATIONS USED:  NONE  SPECIMEN:  No Specimen  DISPOSITION OF SPECIMEN:  N/A  COUNTS:  YES  TOURNIQUET:  * No tourniquets in log *  DICTATION: .Other Dictation: Dictation Number dictation [id number]  PLAN OF CARE: Discharge to home after PACU  PATIENT DISPOSITION:  PACU - hemodynamically stable.   Delay start of Pharmacological VTE agent (>24hrs) due to surgical blood loss or risk of bleeding:

## 2013-10-15 NOTE — Preoperative (Signed)
Beta Blockers   Reason not to administer Beta Blockers:Not Applicable 

## 2013-10-15 NOTE — Anesthesia Preprocedure Evaluation (Signed)
Anesthesia Evaluation  Patient identified by MRN, date of birth, ID band Patient awake    Reviewed: Allergy & Precautions, H&P , NPO status , Patient's Chart, lab work & pertinent test results  History of Anesthesia Complications Negative for: history of anesthetic complications  Airway Mallampati: III TM Distance: <3 FB Neck ROM: Full  Mouth opening: Limited Mouth Opening  Dental  (+) Teeth Intact   Pulmonary neg pulmonary ROS,  breath sounds clear to auscultation        Cardiovascular negative cardio ROS  Rhythm:Regular Rate:Normal     Neuro/Psych    GI/Hepatic   Endo/Other  Morbid obesity  Renal/GU ARFRenal disease (stones)     Musculoskeletal   Abdominal   Peds  Hematology  (+) anemia ,   Anesthesia Other Findings   Reproductive/Obstetrics                           Anesthesia Physical Anesthesia Plan  ASA: II  Anesthesia Plan: MAC   Post-op Pain Management:    Induction: Intravenous  Airway Management Planned: Simple Face Mask  Additional Equipment:   Intra-op Plan:   Post-operative Plan:   Informed Consent: I have reviewed the patients History and Physical, chart, labs and discussed the procedure including the risks, benefits and alternatives for the proposed anesthesia with the patient or authorized representative who has indicated his/her understanding and acceptance.     Plan Discussed with:   Anesthesia Plan Comments:         Anesthesia Quick Evaluation

## 2013-10-15 NOTE — Progress Notes (Signed)
[facility] PROGRESS NOTE  [name]:[id number] DOB: 10-01-1990 DOA: 10/14/2013 PCP: No PCP Per Patient  Assessment/Plan: 1-Right ureteral stone with hydronephrosis  CT scan of the abdomen showed mild to moderate right-sided hydronephrosis with obstructing 9/8 mm stone proximally at right uteropelvic junction. Chelsea [name] is on antibiotics. Status post cystoscopy with right ureteral double-J stent placement by urology. Chelsea [name] will followup with urology for planned outpatient lithotripsy. Anticipate discharge home tomorrow. We'll continue with pain management for today.   2+Anterior abdominal wall hernias  Patient has had colon surgery last year and now has abdominal wall hernias 2 of those contained loops of small bowel noted as of obstruction or stimulation.  Denies any new complaints.   3-Depression  Continue with seroquel and Celexa   4-chronic kidney disease stage III Creatinine appears to be near baseline. Continue IV fluids..   DVT prophylaxis  Lovenox   Code Status: Full Code.  Family Communication: Care discussed with patient.  Disposition Plan: remain inpatient.    Consultants:  urology  Procedures:  none  Antibiotics: Ceftriaxone 1-18.  HPI/Subjective: Complains of ongoing right flank pain.  Objective: Filed Vitals:   10/15/13 1602  BP: 119/60  Pulse: 95  Temp: 98.4 F (36.9 C)  Resp: 20    Intake/Output Summary (Last 24 hours) at 10/15/13 2047 Last data filed at 10/15/13 1806  Gross per 24 hour  Intake 1550.33 ml  Output   6550 ml  Net -4999.67 ml   Filed Weights   10/14/13 0152  Weight: 108.863 kg (240 lb)    Exam:   General:  No distress.   Cardiovascular: S 1, S 2 RRR  Respiratory: CTA  Abdomen: BS present, soft, NT  Musculoskeletal: no edema.   Data Reviewed: Basic Metabolic Panel:  Recent Labs Lab 10/14/13 0224 10/14/13 1038 10/15/13 0621  NA 140 140 143  K 3.7 3.9 4.3  CL 110 112 116*  CO2 18* 18* 17*   GLUCOSE 87 88 88  BUN 12 12 14   CREATININE 1.43* 1.39* 1.42*  CALCIUM 8.3* 8.1* 8.3*   Liver Function Tests: No results found for this basename: AST, ALT, ALKPHOS, BILITOT, PROT, ALBUMIN,  in the last 168 hours No results found for this basename: LIPASE, AMYLASE,  in the last 168 hours No results found for this basename: AMMONIA,  in the last 168 hours CBC:  Recent Labs Lab 10/14/13 0224 10/15/13 0621  WBC 7.8 5.9  HGB 9.8* 9.6*  HCT 31.9* 31.4*  MCV 82.0 82.8  PLT 223 187   Cardiac Enzymes: No results found for this basename: CKTOTAL, CKMB, CKMBINDEX, TROPONINI,  in the last 168 hours BNP (last 3 results) No results found for this basename: PROBNP,  in the last 8760 hours CBG: No results found for this basename: GLUCAP,  in the last 168 hours  No results found for this or any previous visit (from the past 240 hour(s)).   Studies: Ct Abdomen Pelvis Wo Contrast  10/14/2013   CLINICAL DATA:  Right-sided flank pain.  EXAM: CT ABDOMEN AND PELVIS WITHOUT CONTRAST  TECHNIQUE: Multidetector CT imaging of the abdomen and pelvis was performed following the standard protocol without intravenous contrast.  COMPARISON:  CT of the abdomen and pelvis performed 05/05/2013  FINDINGS: The visualized lung bases are clear.  The liver and spleen are unremarkable in appearance. The gallbladder is within normal limits. The pancreas and adrenal glands are unremarkable.  There is mild to moderate right-sided hydronephrosis, with an obstructing 9 x 8 mm stone  noted proximally at the right ureteropelvic junction. Diffuse medullary nephrocalcinosis is noted. There is mild associated cortical thinning. Marked left renal atrophy is seen, with associated calcification.  No free fluid is identified. The small bowel is unremarkable in appearance. The stomach is within normal limits. No acute vascular abnormalities are seen.  The patient is status post appendectomy. A bowel suture line is noted at the hepatic  flexure. The colon is unremarkable in appearance.  Several anterior abdominal hernias are seen, with two broad-based hernias more superiorly, and an additional periumbilical hernia more inferiorly. Two contain loops of small bowel, and the most superior hernia contains a segment of transverse colon. There is no evidence of obstruction or strangulation.  The bladder is mildly distended and grossly unremarkable in appearance. The uterus is unremarkable in appearance. The ovaries are relatively symmetric; no suspicious adnexal masses are seen. A borderline prominent left inguinal node is seen, measuring 1.2 cm in short axis; this appears to be stable and reflects the patient's baseline.  No acute osseous abnormalities are identified.  IMPRESSION: 1. Mild to moderate right-sided hydronephrosis, with an obstructing 9 x 8 mm mm stone noted proximally at the right ureteropelvic junction. 2. Diffuse medullary nephrocalcinosis noted, with mild cortical thinning. Marked left renal atrophy seen, with associated calcification. 3. Three anterior abdominal wall hernias seen; two of these contain loops of small bowel, while the most superior hernia contains a segment of transverse colon. No evidence of obstruction or strangulation.   Electronically Signed   By: Roanna RaiderJeffery  Chang M.D.   On: 10/14/2013 05:01   Dg Retrograde Pyelogram  10/15/2013   CLINICAL DATA:  Right ureteral calculus, retrograde pyelogram and ureteral stent placement  EXAM: RETROGRADE PYELOGRAM  COMPARISON:  CT abdomen pelvis -10/14/2013  FLUOROSCOPY TIME:  5 min, 15 second  FINDINGS: Initial image demonstrates selective cannulation of the distal aspect of the right ureter. There is subsequent contrast opacification of the mid and distal aspects of the right ureter.  There is a persistent nonocclusive filling defect within the right ureter at the level of the right pelvic brim which likely correlates with the partially obstructing right-sided distal ureteral  stone seen on preceding abdominal CT. There is moderate dilatation of the upstream right ureter.  Subsequent image demonstrates opacification of the right renal collecting system which appears moderately dilated. Note, the additional stone within the superior aspect of the right ureter/caudal aspect of the right renal pelvis is not definitely identified on the present examination.  Subsequent images demonstrate placement of a right-sided double-J ureteral stent with superior coil apparently within the superior aspect of the right renal pelvis (though note, evaluation degraded due to patient motion artifact) and inferior coil overlying the expected location of the urinary bladder.  IMPRESSION: 1. Filling defect within the right ureter at the level of the pelvic brim likely correlates with the partially obstructing stone seen on preceding abdominal CT. 2. The additional stone within the caudal aspect of the right renal pelvis is not definitely identified on the present examination. 3. Placement of right-sided double-J ureteral stent.   Electronically Signed   By: Simonne ComeJohn  Watts M.D.   On: 10/15/2013 15:20    Scheduled Meds: . cefTRIAXone (ROCEPHIN)  IV  1 g Intravenous Q24H  . citalopram  40 mg Oral Daily  . docusate sodium  100 mg Oral BID  . enoxaparin (LOVENOX) injection  40 mg Subcutaneous Q24H  . influenza vac split quadrivalent PF  0.5 mL Intramuscular Tomorrow-1000  . polyethylene glycol  17 g Oral BID  . QUEtiapine  100 mg Oral QHS   Continuous Infusions: . dextrose 5 % and 0.45% NaCl 100 mL/hr at 10/15/13 1449    Principal Problem:   Right ureteral stone Active Problems:   ARF (acute renal failure)   RTA (renal tubular acidosis)   Kidney stone on right side   Renal and urologic disorders    Time spent: 25 minutes.     [facility]  [facility] Pager [phone]. If 7PM-7AM, please contact night-coverage at [email or web address], password [facility] 10/15/2013, 8:47 PM  LOS: 1 day

## 2013-10-15 NOTE — Addendum Note (Signed)
Addendum created 10/15/13 1402 by Earleen NewportAmy A Kolson Chovanec, CRNA   Modules edited: Anesthesia Medication Administration

## 2013-10-15 NOTE — Anesthesia Procedure Notes (Signed)
Procedure Name: MAC Date/Time: 10/15/2013 12:41 PM Performed by: Pernell DupreADAMS, AMY A Pre-anesthesia Checklist: Patient identified, Timeout performed, Emergency Drugs available, Suction available and Patient being monitored Oxygen Delivery Method: Non-rebreather mask

## 2013-10-15 NOTE — Progress Notes (Signed)
Patient was complaining of the foley irritating her and wanted me to take it out, i then reminded her she was having surgery in the morning and would need the foley. I offered a warm pack and she accepted that and was fine. Patient also sctracthed and burst a small boil on her lower abd that was bleeding a moderate amount. i placed gauze and tape and it stopped bleeding, she said she gets boils all the time.

## 2013-10-15 NOTE — Transfer of Care (Signed)
Immediate Anesthesia Transfer of Care Note  Patient: Chelsea [name]  Procedure(s) Performed: Procedure(s): CYSTOSCOPY WITH RETROGRADE PYELOGRAM/URETERAL STENT PLACEMENT (Right)  Patient Location: PACU  Anesthesia Type:MAC  Level of Consciousness: sedated and patient cooperative  Airway & Oxygen Therapy: Patient Spontanous Breathing and Patient connected to face mask oxygen  Post-op Assessment: Report given to PACU RN and Post -op Vital signs reviewed and stable  Post vital signs: Reviewed and stable  Complications: No apparent anesthesia complications

## 2013-10-15 NOTE — Consult Note (Signed)
NAMMarlinda Mike:  [name], Chelsea            ACCOUNT NO.:  [id number]  MEDICAL RECORD NO.:  [id number]  LOCATION:  A332                          FACILITY:  [facility]  PHYSICIAN:  Ky BarbanMohammad I. Oddie Bottger, M.D.DATE OF BIRTH:  10-Dec-1990  DATE OF CONSULTATION: DATE OF DISCHARGE:                                CONSULTATION   CHIEF COMPLAINT:  Recurrent right renal colic.  HISTORY:  A 23 year old female.  I had taken care of her last year for similar problem when [facility] went [facility] with right flank pain.  She has a history of having kidney stones and nonfunctioning left kidney.  She was having pain in the right flank for the last few days, then it became worse so [facility].  She says they did an ultrasound which diagnosed that she has a blocked kidney with a stone.  She was discharged to be followed up by the urologist next week, but today the pain got worse so she came to our emergency room.  No nausea, vomiting, fever, or chills.  PAST MEDICAL HISTORY:  History of having kidney stones last year in June.  I did a lithotripsy of right ureteral calculus.  I have not seen her regularly in the office.  No diabetes or hypertension.  Her past medical history includes history of kidney stones, rickets, and constipation.  PAST SURGICAL HISTORY:  History of having ureteral stent, appendectomy, colon surgery, and leg surgery.  Also, she had ESWL done on March 21, 2013, on the right side.  She said she passed the kidney stones after that.  She is already taking Urocit-K 10 mEq 3 times daily with meals.  She also suffers from depression and takes medicine.  Takes Zantac for reflux.  For constipation, she takes Colace.  PHYSICAL EXAMINATION:  GENERAL:  Moderately obese female. VITAL SIGNS:  Blood pressure 113/61, temperature 97.7. ABDOMEN:  Full.  Liver, spleen, kidneys not palpable.  Deep tenderness right flank. PELVIC EXAM:  deferred. EXTREMITIES:   Normal.  LABORATORY DATA:  WBC count is 7.8, hematocrit 31.9.  Sodium 140, potassium 3.7, chloride 110, CO2 is 18.  BUN is 12, creatinine 1.47.  IMPRESSION:  Right renal calculus within the ureteropelvic junction, size 8 to 9 mm, with partial obstruction; hydronephrosis; nonfunctioning left kidney.  We will go and put a stent in the morning under anesthesia.  Discussed with the patient after putting the stent she will undergo ESWL as outpatient.  After the stent tomorrow, she probably can go home.     Ky BarbanMohammad I. Mackinzie Vuncannon, M.D.     MIJ/MEDQ  D:  10/14/2013  T:  10/14/2013  Job:  [id number]

## 2013-10-15 NOTE — Anesthesia Postprocedure Evaluation (Signed)
  Anesthesia Post-op Note  Patient: Chelsea [name]  Procedure(s) Performed: Procedure(s): CYSTOSCOPY WITH RETROGRADE PYELOGRAM/URETERAL STENT PLACEMENT (Right)  Patient Location: PACU  Anesthesia Type:MAC  Level of Consciousness: sedated and patient cooperative  Airway and Oxygen Therapy: Patient Spontanous Breathing and Patient connected to face mask oxygen  Post-op Pain: none  Post-op Assessment: Post-op Vital signs reviewed, Patient's Cardiovascular Status Stable, Respiratory Function Stable, Patent Airway, No signs of Nausea or vomiting and Pain level controlled  Post-op Vital Signs: Reviewed and stable  Complications: No apparent anesthesia complications

## 2013-10-16 LAB — BASIC METABOLIC PANEL
BUN: 14 mg/dL (ref 6–23)
CHLORIDE: 111 meq/L (ref 96–112)
CO2: 18 mEq/L — ABNORMAL LOW (ref 19–32)
Calcium: 8.5 mg/dL (ref 8.4–10.5)
Creatinine, Ser: 1.45 mg/dL — ABNORMAL HIGH (ref 0.50–1.10)
GFR calc Af Amer: 59 mL/min — ABNORMAL LOW (ref >90)
GFR calc non Af Amer: 51 mL/min — ABNORMAL LOW (ref >90)
Glucose, Bld: 93 mg/dL (ref 70–99)
Potassium: 4 mEq/L (ref 3.7–5.3)
Sodium: 141 mEq/L (ref 137–147)

## 2013-10-16 LAB — CBC
HEMATOCRIT: 32.5 % — AB (ref 36.0–46.0)
Hemoglobin: 9.8 g/dL — ABNORMAL LOW (ref 12.0–15.0)
MCH: 24.7 pg — ABNORMAL LOW (ref 26.0–34.0)
MCHC: 30.2 g/dL (ref 30.0–36.0)
MCV: 82.1 fL (ref 78.0–100.0)
Platelets: 204 10*3/uL (ref 150–400)
RBC: 3.96 MIL/uL (ref 3.87–5.11)
RDW: 16.9 % — ABNORMAL HIGH (ref 11.5–15.5)
WBC: 6.1 10*3/uL (ref 4.0–10.5)

## 2013-10-16 MED ORDER — PROMETHAZINE HCL 12.5 MG PO TABS
25.0000 mg | ORAL_TABLET | Freq: Four times a day (QID) | ORAL | Status: DC | PRN
  Administered 2013-10-16: 25 mg via ORAL
  Filled 2013-10-16: qty 2

## 2013-10-16 MED ORDER — OXYCODONE-ACETAMINOPHEN 5-325 MG PO TABS: 1.0000 | ORAL_TABLET | ORAL | Status: DC | PRN

## 2013-10-16 MED ORDER — PROMETHAZINE HCL 25 MG PO TABS: 25.0000 mg | ORAL_TABLET | Freq: Four times a day (QID) | ORAL | Status: DC | PRN

## 2013-10-16 MED ORDER — BISACODYL 10 MG RE SUPP
10.0000 mg | Freq: Every day | RECTAL | Status: DC | PRN
  Filled 2013-10-16: qty 1

## 2013-10-16 MED ORDER — MAGNESIUM HYDROXIDE 400 MG/5ML PO SUSP
30.0000 mL | Freq: Every day | ORAL | Status: DC | PRN
  Administered 2013-10-16 (×2): 30 mL via ORAL
  Filled 2013-10-16: qty 30

## 2013-10-16 MED ORDER — MILK AND MOLASSES ENEMA
1.0000 | Freq: Once | RECTAL | Status: AC
  Administered 2013-10-16: 250 mL via RECTAL

## 2013-10-16 NOTE — Discharge Summary (Signed)
Patient seen and examined.  Above note reviewed.  Patient was admitted to the hospital for abdominal pain due to ureteric colic.  She was found to have right UPJ stone with hydronephrosis.  She had JJ stent placed on right via cystoscopy.  Currently, patient does not appear to be uncomfortable.  She is tolerating a regular diet and is having bowel movements.  She ambulated in the halls.  During my visit, she was texting on her cell phone. Her abdominal examination is soft, mild tenderness, bs+.  Plans are to follow up with Dr. Jerre SimonJavaid in the outpatient setting to consider lithotripsy.  Chelsea [name]

## 2013-10-16 NOTE — Anesthesia Postprocedure Evaluation (Signed)
  Anesthesia Post-op Note  Patient: Chelsea MikeStephanie [name]  Procedure(s) Performed: Procedure(s): CYSTOSCOPY WITH RETROGRADE PYELOGRAM/URETERAL STENT PLACEMENT (Right)  Patient Location: PACU  Anesthesia Type:MAC  Level of Consciousness: awake, alert , oriented and patient cooperative  Airway and Oxygen Therapy: Patient Spontanous Breathing  Post-op Pain: 2 /10, mild  Post-op Assessment: Post-op Vital signs reviewed, Patient's Cardiovascular Status Stable, Respiratory Function Stable, Patent Airway, No signs of Nausea or vomiting, Adequate PO intake and Pain level controlled  Post-op Vital Signs: Reviewed and stable  Complications: No apparent anesthesia complications

## 2013-10-16 NOTE — Anesthesia Postprocedure Evaluation (Deleted)
  Anesthesia Post-op Note  Patient: Chelsea [name]  Procedure(s) Performed: Procedure(s): CYSTOSCOPY WITH RETROGRADE PYELOGRAM/URETERAL STENT PLACEMENT (Right)  Patient Location: ICU  Anesthesia Type:General  Level of Consciousness: awake, alert  and oriented  Airway and Oxygen Therapy: Patient Spontanous Breathing and Patient connected to nasal cannula oxygen  Post-op Pain: none  Post-op Assessment: Post-op Vital signs reviewed, Patient's Cardiovascular Status Stable, Respiratory Function Stable, Patent Airway and No signs of Nausea or vomiting  Post-op Vital Signs: Reviewed and stable  Complications: No apparent anesthesia complications

## 2013-10-16 NOTE — Plan of Care (Signed)
Problem: Food- and Nutrition-Related Knowledge Deficit (NB-1.1) Goal: Nutrition education Formal process to instruct or train a patient/client in a skill or to impart knowledge to help patients/clients voluntarily manage or modify food choices and eating behavior to maintain or improve health. Outcome: Adequate for Discharge Pt not "feeling like" receiving education for weightt loss but did accept handouts which included: "Ten Tips For Successful Weight Loss", [employer] "Your [employer] GroupWhole-Month Meal Planner", and "Healthy Guide to [employer] Sho[employer]".

## 2013-10-16 NOTE — Discharge Summary (Signed)
Physician Discharge Summary  Chelsea [name] YQM:[id number] DOB: 1991/02/07 DOA: 10/14/2013  PCP: No PCP Per Patient  Admit date: 10/14/2013 Discharge date: 10/16/2013  Time spent: 30 minutes  Recommendations for Outpatient Follow-up:  1. Follow up with Dr. Jerre Simon 1 week for evaluation of symptom and scheduling lithotripsy  2. PCP 1-2 weeks to trend renal function  Discharge Diagnoses:  Principal Problem:   Right ureteral stone Active Problems:   ARF (acute renal failure)   RTA (renal tubular acidosis)   Kidney stone on right side   Renal and urologic disorders   Morbid obesity   Discharge Condition: stable  Diet recommendation: regular  Filed Weights   10/14/13 0152  Weight: 108.863 kg (240 lb)    History of present illness:  23 year old female who has a past medical history of Kidney stones; Rickets; and Constipation presented to the ED on 10/14/13 with right flank pain. Patient has history of kidney stones and has left nonfunctioning kidney. Patient had been having right flank pain for the previous few days and she was seen at ED at [facility]. At that time the ultrasound showed that she had kidney stone the right with a blockage. Patient was discharged to follow up with urologist next week but on 10/14/13 patient's pain got worse and she came to the ED she when she developed associated dysuria nausea vomiting. The pain she rated is 8/10 in intensity which radiated to the groin.  She denied chest pain, no shortness of breath.   Hospital Course:  1-Right ureteral stone with hydronephrosis  CT scan of the abdomen showed mild to moderate right-sided hydronephrosis with obstructing 9/8 mm stone proximally at right uteropelvic junction. She received cipro and rocephin. Status post cystoscopy with right ureteral double-J stent placement by urology on 10/15/13. She will followup with urology for planned outpatient lithotripsy.    2+Anterior abdominal wall hernias  Patient has  had colon surgery last year and now has abdominal wall hernias 2 of those contained loops of small bowel no evidence of obstruction or stimulation. Stable at discharge   3-Depression  Continue with seroquel and Celexa. Stable at discharge   4-chronic kidney disease stage III  Provided with IV fluids. At discharge creatinine close to baseline. Recommend follow up with PCP to trend renal function  5- obesity: BMI 50. Nutritional consult for weight loss   Procedures: CYSTOSCOPY WITH RETROGRADE PYELOGRAM/URETERAL STENT PLACEMENT (Right  Consultations:  Dr. Jerre Simon urology  Discharge Exam: Filed Vitals:   10/16/13 0500  BP: 119/63  Pulse: 68  Temp: 97.7 F (36.5 C)  Resp: 16    General: obese NAD Cardiovascular: RRR No MGR No LE edema Respiratory: normal effort BS clear bilaterally no wheeze Abdomen: obsese, soft very diminished BS non-tender to palpation  Discharge Instructions     Medication List         citalopram 40 MG tablet  Commonly known as:  CELEXA  Take 40 mg by mouth daily.     docusate sodium 100 MG capsule  Commonly known as:  COLACE  Take 100 mg by mouth 2 (two) times daily.     FERROUSUL 325 (65 FE) MG tablet  Generic drug:  ferrous sulfate  Take 325 mg by mouth at bedtime.     oxyCODONE-acetaminophen 5-325 MG per tablet  Commonly known as:  PERCOCET/ROXICET  Take 1-2 tablets by mouth every 4 (four) hours as needed for severe pain.     potassium citrate 10 MEQ (1080 MG) SR tablet  Commonly  known as:  UROCIT-K  Take 30 mEq by mouth 3 (three) times daily with meals.     QUEtiapine 100 MG tablet  Commonly known as:  SEROQUEL  Take 100 mg by mouth at bedtime.     ranitidine 150 MG tablet  Commonly known as:  ZANTAC  Take 150 mg by mouth 2 (two) times daily.       No Known Allergies Follow-up Information   Follow up with Chelsea Borne I, MD. (follow up 1 week. call if worsening pain or temperature > 101)    Specialty:  Urology   Contact  information:   [address] [place] [address] [phone]        The results of significant diagnostics from this hospitalization (including imaging, microbiology, ancillary and laboratory) are listed below for reference.    Significant Diagnostic Studies: Ct Abdomen Pelvis Wo Contrast  10/14/2013   CLINICAL DATA:  Right-sided flank pain.  EXAM: CT ABDOMEN AND PELVIS WITHOUT CONTRAST  TECHNIQUE: Multidetector CT imaging of the abdomen and pelvis was performed following the standard protocol without intravenous contrast.  COMPARISON:  CT of the abdomen and pelvis performed 05/05/2013  FINDINGS: The visualized lung bases are clear.  The liver and spleen are unremarkable in appearance. The gallbladder is within normal limits. The pancreas and adrenal glands are unremarkable.  There is mild to moderate right-sided hydronephrosis, with an obstructing 9 x 8 mm stone noted proximally at the right ureteropelvic junction. Diffuse medullary nephrocalcinosis is noted. There is mild associated cortical thinning. Marked left renal atrophy is seen, with associated calcification.  No free fluid is identified. The small bowel is unremarkable in appearance. The stomach is within normal limits. No acute vascular abnormalities are seen.  The patient is status post appendectomy. A bowel suture line is noted at the hepatic flexure. The colon is unremarkable in appearance.  Several anterior abdominal hernias are seen, with two broad-based hernias more superiorly, and an additional periumbilical hernia more inferiorly. Two contain loops of small bowel, and the most superior hernia contains a segment of transverse colon. There is no evidence of obstruction or strangulation.  The bladder is mildly distended and grossly unremarkable in appearance. The uterus is unremarkable in appearance. The ovaries are relatively symmetric; no suspicious adnexal masses are seen. A borderline prominent left inguinal node is seen,  measuring 1.2 cm in short axis; this appears to be stable and reflects the patient's baseline.  No acute osseous abnormalities are identified.  IMPRESSION: 1. Mild to moderate right-sided hydronephrosis, with an obstructing 9 x 8 mm mm stone noted proximally at the right ureteropelvic junction. 2. Diffuse medullary nephrocalcinosis noted, with mild cortical thinning. Marked left renal atrophy seen, with associated calcification. 3. Three anterior abdominal wall hernias seen; two of these contain loops of small bowel, while the most superior hernia contains a segment of transverse colon. No evidence of obstruction or strangulation.   Electronically Signed   By: Roanna Raider M.D.   On: 10/14/2013 05:01   Dg Retrograde Pyelogram  10/15/2013   CLINICAL DATA:  Right ureteral calculus, retrograde pyelogram and ureteral stent placement  EXAM: RETROGRADE PYELOGRAM  COMPARISON:  CT abdomen pelvis -10/14/2013  FLUOROSCOPY TIME:  5 min, 15 second  FINDINGS: Initial image demonstrates selective cannulation of the distal aspect of the right ureter. There is subsequent contrast opacification of the mid and distal aspects of the right ureter.  There is a persistent nonocclusive filling defect within the right ureter at the level of the  right pelvic brim which likely correlates with the partially obstructing right-sided distal ureteral stone seen on preceding abdominal CT. There is moderate dilatation of the upstream right ureter.  Subsequent image demonstrates opacification of the right renal collecting system which appears moderately dilated. Note, the additional stone within the superior aspect of the right ureter/caudal aspect of the right renal pelvis is not definitely identified on the present examination.  Subsequent images demonstrate placement of a right-sided double-J ureteral stent with superior coil apparently within the superior aspect of the right renal pelvis (though note, evaluation degraded due to patient  motion artifact) and inferior coil overlying the expected location of the urinary bladder.  IMPRESSION: 1. Filling defect within the right ureter at the level of the pelvic brim likely correlates with the partially obstructing stone seen on preceding abdominal CT. 2. The additional stone within the caudal aspect of the right renal pelvis is not definitely identified on the present examination. 3. Placement of right-sided double-J ureteral stent.   Electronically Signed   By: Simonne ComeJohn  Watts M.D.   On: 10/15/2013 15:20    Microbiology: Recent Results (from the past 240 hour(s))  URINE CULTURE     Status: None   Collection Time    10/14/13  2:54 AM      Result Value Range Status   Specimen Description URINE, CATHETERIZED   Final   Special Requests NONE   Final   Culture  Setup Time     Final   Value: 10/14/2013 21:25     Performed at [facility]   Colony Count     Final   Value: NO GROWTH     [facility]formed at [facility]   Culture     Final   Value: NO GROWTH     Performed at [facility]   Report Status 10/15/2013 FINAL   Final     Labs: Basic Metabolic Panel:  Recent Labs Lab 10/14/13 0224 10/14/13 1038 10/15/13 0621 10/16/13 0545  NA 140 140 143 141  K 3.7 3.9 4.3 4.0  CL 110 112 116* 111  CO2 18* 18* 17* 18*  GLUCOSE 87 88 88 93  BUN 12 12 14 14   CREATININE 1.43* 1.39* 1.42* 1.45*  CALCIUM 8.3* 8.1* 8.3* 8.5   Liver Function Tests: No results found for this basename: AST, ALT, ALKPHOS, BILITOT, PROT, ALBUMIN,  in the last 168 hours No results found for this basename: LIPASE, AMYLASE,  in the last 168 hours No results found for this basename: AMMONIA,  in the last 168 hours CBC:  Recent Labs Lab 10/14/13 0224 10/15/13 0621 10/16/13 0545  WBC 7.8 5.9 6.1  HGB 9.8* 9.6* 9.8*  HCT 31.9* 31.4* 32.5*  MCV 82.0 82.8 82.1  PLT 223 187 204   Cardiac Enzymes: No results found for this basename: CKTOTAL, CKMB, CKMBINDEX, TROPONINI,  in the last 168  hours BNP: BNP (last 3 results) No results found for this basename: PROBNP,  in the last 8760 hours CBG: No results found for this basename: GLUCAP,  in the last 168 hours     Signed:  Gwenyth BenderBLACK,KAREN M  [facility] 10/16/2013, 11:59 AM

## 2013-10-16 NOTE — Op Note (Signed)
NAMMarlinda [name]:  Everard, Medea            ACCOUNT NO.:  [id number]  MEDICAL RECORD NO.:  [id number]  LOCATION:  A332                          FACILITY:  [facility]  PHYSICIAN:  Ky BarbanMohammad I. Zella Dewan, M.D.DATE OF BIRTH:  [birth year]  DATE OF PROCEDURE: DATE OF DISCHARGE:                              OPERATIVE REPORT   PREOPERATIVE DIAGNOSIS:  Right renal calculus.  POSTOPERATIVE DIAGNOSIS:  Right renal calculus.  PROCEDURES:  Cystoscopy, right retrograde pyelogram, insertion of double- J stent, size 26-cm 5-French.  ANESTHESIA:  IV sedation with MAC.  DESCRIPTION OF PROCEDURE:  The patient was placed in lithotomy position after usual prep and drape.  MAC anesthesia was administered.  The patient is very obese and very difficult to manage.  With some difficulty, I put the cystoscope in the bladder and now, I can see there is a small stone coming out of the right ureteral orifice, which fell into the bladder.  I was able to pass a wedge catheter into the right ureteral orifice.  Hypaque was injected under fluoroscopic control.  The patient is so obese and big.  It was difficult to even do a fluoro, but with some difficulty, we were able to do flow and the dye goes up into the upper ureter, the ureter appears to be slightly dilated.  I could never see the stone in the UPJ, but I removed the wedge catheter and inserted an open-ended catheter, passed up into the renal pelvis.  Only thing I could see was hydronephrotic drip.  Guidewire was passed up through the open-ended catheter, removed the open-ended catheter, left the guidewire in place and then I was able to insert #5-French 26-cm double-J stent over the guidewire under fluoroscopic control.  It goes up into the renal pelvis.  I was able to outline the renal pelvis with open-ended catheter in place, injecting the dye.  Once the renal pelvis was outlined, it was easier to see everything.  Guidewire was visualize and over the guidewire, I was able  to pass a double-J stent without string and after removing the guidewire, I could see the loop in the renal pelvis and after removing the guidewire completely, then there was a nice loop obtain in the bladder.  I have taken the string out of the stent.  All the instruments were removed.  The patient left the operating room in satisfactory condition.     Ky BarbanMohammad I. Myrissa Chipley, M.D.     MIJ/MEDQ  D:  10/15/2013  T:  10/16/2013  Job:  [id number]

## 2013-10-16 NOTE — Clinical Social Work Note (Signed)
CSW referred by MD for transportation issues. Pt has contacted several members and believes her sister-in-law should be able to pick her up this evening. RN notified.   Derenda FennelKara Dadrian [place], [place] [phone]

## 2013-10-17 ENCOUNTER — Encounter (HOSPITAL_COMMUNITY): Payer: Self-pay | Admitting: Urology

## 2013-10-26 ENCOUNTER — Encounter (HOSPITAL_COMMUNITY): Payer: Self-pay | Admitting: Emergency Medicine

## 2013-10-26 ENCOUNTER — Emergency Department (HOSPITAL_COMMUNITY)
Admission: EM | Admit: 2013-10-26 | Discharge: 2013-10-26 | Disposition: A | Discharge status: Home / Self Care | Payer: Medicaid - Out of State | Attending: Emergency Medicine | Admitting: Emergency Medicine

## 2013-10-26 DIAGNOSIS — Z8639 Personal history of other endocrine, nutritional and metabolic disease: Secondary | ICD-10-CM | POA: Insufficient documentation

## 2013-10-26 DIAGNOSIS — Z9889 Other specified postprocedural states: Secondary | ICD-10-CM | POA: Insufficient documentation

## 2013-10-26 DIAGNOSIS — Z79899 Other long term (current) drug therapy: Secondary | ICD-10-CM | POA: Insufficient documentation

## 2013-10-26 DIAGNOSIS — N390 Urinary tract infection, site not specified: Secondary | ICD-10-CM | POA: Insufficient documentation

## 2013-10-26 DIAGNOSIS — Z862 Personal history of diseases of the blood and blood-forming organs and certain disorders involving the immune mechanism: Secondary | ICD-10-CM | POA: Insufficient documentation

## 2013-10-26 DIAGNOSIS — N200 Calculus of kidney: Secondary | ICD-10-CM | POA: Insufficient documentation

## 2013-10-26 LAB — URINE MICROSCOPIC-ADD ON

## 2013-10-26 LAB — URINALYSIS, ROUTINE W REFLEX MICROSCOPIC
Bilirubin Urine: NEGATIVE
GLUCOSE, UA: NEGATIVE mg/dL
KETONES UR: NEGATIVE mg/dL
NITRITE: NEGATIVE
PROTEIN: NEGATIVE mg/dL
Specific Gravity, Urine: 1.01 (ref 1.005–1.030)
Urobilinogen, UA: 0.2 mg/dL (ref 0.0–1.0)
pH: 7 (ref 5.0–8.0)

## 2013-10-26 MED ORDER — CEFTRIAXONE SODIUM 1 G IJ SOLR
1.0000 g | Freq: Once | INTRAMUSCULAR | Status: AC
  Administered 2013-10-26: 1 g via INTRAMUSCULAR
  Filled 2013-10-26: qty 10

## 2013-10-26 MED ORDER — HYDROMORPHONE HCL PF 1 MG/ML IJ SOLN
1.0000 mg | Freq: Once | INTRAMUSCULAR | Status: AC
  Administered 2013-10-26: 1 mg via INTRAMUSCULAR
  Filled 2013-10-26: qty 1

## 2013-10-26 MED ORDER — CEPHALEXIN 500 MG PO CAPS: 500.0000 mg | ORAL_CAPSULE | Freq: Four times a day (QID) | ORAL | Status: DC

## 2013-10-26 NOTE — ED Notes (Signed)
Patient with no complaints at this time. Respirations even and unlabored. Skin warm/dry. Discharge instructions reviewed with patient at this time. Patient given opportunity to voice concerns/ask questions. Patient discharged at this time and left Emergency Department with steady gait.   

## 2013-10-26 NOTE — ED Notes (Signed)
PT C/O LOWER RIGHT SIDED ABDOMINAL PAIN AND LOWER BACK PAIN. PT HAS KNOWN KIDNEY STONE W/STENT IS SUPPOSED TO HAVE LITHOTRIPSY DONE SOON.

## 2013-10-26 NOTE — ED Provider Notes (Signed)
CSN: [id number]     Arrival date & time 10/26/13  2101 History  This chart was scribed for Benny LennertJoseph L Jatorian Renault, MD by Luisa DagoPriscilla Tutu, ED Scribe. This patient was seen in room [facility]14[facility] and the patient's care was started at 9:49 PM.    Chief Complaint  Patient presents with  . Abdominal Pain   Patient is a 23 y.o. female presenting with abdominal pain. The history is provided by the patient. No language interpreter was used.  Abdominal Pain Pain location:  R flank and RLQ Pain quality: dull   Pain radiates to:  Does not radiate Pain severity:  Mild Onset quality:  Gradual Duration:  1 week Timing:  Constant Progression:  Unchanged Chronicity:  New Relieved by:  Nothing Exacerbated by: prescribed pain medication. Ineffective treatments: pain medication. Associated symptoms: no chills and no fever    HPI Comments: Chelsea MikeStephanie [name] is a 23 y.o. female with a h/o kidney stones presents to the Emergency Department complaining of abdominal pain that started 1 week ago. Pt states that she is also experiencing right sided back pain, and dysuria. She states that she has a stent in her right kidney. Pt has a scheduled appointment with her urologist on 10/30/13. She reports taking pain medication with minimal to no relief. Pt denies any fever or chills.  Past Medical History  Diagnosis Date  . Kidney stones   . Rickets   . Constipation    Past Surgical History  Procedure Laterality Date  . Ureteral stents    . Has only 1 functioning  kidney    . Colon surgery    . Appendectomy    . Leg surgery    . Extracorporeal shock wave lithotripsy Right 03/21/2013    Procedure: EXTRACORPOREAL SHOCK WAVE LITHOTRIPSY (ESWL) RIGHT URETERAL CALCULUS;  Surgeon: Ky BarbanMohammad I Javaid, MD;  Location: [facility];  Service: Urology;  Laterality: Right;  . Cystoscopy w/ ureteral stent placement Right 10/15/2013    Procedure: CYSTOSCOPY WITH RETROGRADE PYELOGRAM/URETERAL STENT PLACEMENT;  Surgeon: Ky BarbanMohammad I Javaid, MD;   Location: [facility];  Service: Urology;  Laterality: Right;   Family History  Problem Relation Age of Onset  . Kidney disease Father    History  Substance Use Topics  . Smoking status: Never Smoker   . Smokeless tobacco: Not on file  . Alcohol Use: No   OB History   Grav Para Term Preterm Abortions TAB SAB Ect Mult Living                 Review of Systems  Constitutional: Negative for fever and chills.  Gastrointestinal: Positive for abdominal pain.  All other systems reviewed and are negative.    Allergies  Review of patient's allergies indicates no known allergies.  Home Medications   Current Outpatient Rx  Name  Route  Sig  Dispense  Refill  . citalopram (CELEXA) 40 MG tablet   Oral   Take 40 mg by mouth daily.         Marland Kitchen. docusate sodium (COLACE) 100 MG capsule   Oral   Take 100 mg by mouth 2 (two) times daily.         . ferrous sulfate (FERROUSUL) 325 (65 FE) MG tablet   Oral   Take 325 mg by mouth at bedtime.         Marland Kitchen. oxyCODONE-acetaminophen (PERCOCET/ROXICET) 5-325 MG per tablet   Oral   Take 1-2 tablets by mouth every 4 (four) hours as needed for severe pain.  15 tablet   0   . potassium citrate (UROCIT-K) 10 MEQ (1080 MG) SR tablet   Oral   Take 30 mEq by mouth 3 (three) times daily with meals.         . promethazine (PHENERGAN) 25 MG tablet   Oral   Take 1 tablet (25 mg total) by mouth every 6 (six) hours as needed for nausea or vomiting.   30 tablet   0   . QUEtiapine (SEROQUEL) 100 MG tablet   Oral   Take 100 mg by mouth at bedtime.         . ranitidine (ZANTAC) 150 MG tablet   Oral   Take 150 mg by mouth 2 (two) times daily.           Triage vitals:BP 107/53  Pulse 82  Temp(Src) 98 F (36.7 C) (Oral)  Resp 20  SpO2 100%  LMP 10/21/2013  Physical Exam  Nursing note and vitals reviewed. Constitutional: She is oriented to person, place, and time. She appears well-developed.  HENT:  Head: Normocephalic.  Eyes:  Conjunctivae and EOM are normal. No scleral icterus.  Neck: Neck supple. No thyromegaly present.  Cardiovascular: Normal rate and regular rhythm.  Exam reveals no gallop and no friction rub.   No murmur heard. Pulmonary/Chest: No stridor. She has no wheezes. She has no rales. She exhibits no tenderness.  Abdominal: Soft. She exhibits no distension. There is tenderness (mild) in the right lower quadrant. There is CVA tenderness (mild). There is no rebound.  Musculoskeletal: Normal range of motion. She exhibits no edema.  Lymphadenopathy:    She has no cervical adenopathy.  Neurological: She is oriented to person, place, and time. She exhibits normal muscle tone. Coordination normal.  Skin: No rash noted. No erythema.  Psychiatric: She has a normal mood and affect. Her behavior is normal.    ED Course  Procedures (including critical care time)  DIAGNOSTIC STUDIES: Oxygen Saturation is 100% on RA, normal by my interpretation.    COORDINATION OF CARE: 9:57 PM- Will order UA. Pt advised of plan for treatment and pt agrees.  Labs Review Labs Reviewed  URINALYSIS, ROUTINE W REFLEX MICROSCOPIC - Abnormal; Notable for the following:    Hgb urine dipstick LARGE (*)    Leukocytes, UA MODERATE (*)    All other components within normal limits  URINE MICROSCOPIC-ADD ON - Abnormal; Notable for the following:    Bacteria, UA FEW (*)    All other components within normal limits   Imaging Review No results found.  EKG Interpretation   None       MDM  Pt with stent and kidney stone,  To see urology this week.  Will cover with antibiotics for uti        The chart was scribed for me under my direct supervision.  I personally performed the history, physical, and medical decision making and all procedures in the evaluation of this patient.Benny Lennert, MD 10/26/13 2249

## 2013-10-26 NOTE — Discharge Instructions (Signed)
Follow up with your md as planned this week °

## 2014-01-14 ENCOUNTER — Emergency Department (HOSPITAL_COMMUNITY)
Admission: EM | Admit: 2014-01-14 | Discharge: 2014-01-14 | Disposition: A | Discharge status: Home / Self Care | Payer: Medicaid - Out of State | Attending: Emergency Medicine | Admitting: Emergency Medicine

## 2014-01-14 ENCOUNTER — Encounter (HOSPITAL_COMMUNITY): Payer: Self-pay | Admitting: Emergency Medicine

## 2014-01-14 DIAGNOSIS — Z8639 Personal history of other endocrine, nutritional and metabolic disease: Secondary | ICD-10-CM | POA: Insufficient documentation

## 2014-01-14 DIAGNOSIS — Z87442 Personal history of urinary calculi: Secondary | ICD-10-CM | POA: Insufficient documentation

## 2014-01-14 DIAGNOSIS — Z79899 Other long term (current) drug therapy: Secondary | ICD-10-CM | POA: Insufficient documentation

## 2014-01-14 DIAGNOSIS — K5900 Constipation, unspecified: Secondary | ICD-10-CM | POA: Insufficient documentation

## 2014-01-14 DIAGNOSIS — Z3202 Encounter for pregnancy test, result negative: Secondary | ICD-10-CM | POA: Insufficient documentation

## 2014-01-14 DIAGNOSIS — Z862 Personal history of diseases of the blood and blood-forming organs and certain disorders involving the immune mechanism: Secondary | ICD-10-CM | POA: Insufficient documentation

## 2014-01-14 DIAGNOSIS — B86 Scabies: Secondary | ICD-10-CM | POA: Insufficient documentation

## 2014-01-14 DIAGNOSIS — Z9889 Other specified postprocedural states: Secondary | ICD-10-CM | POA: Insufficient documentation

## 2014-01-14 DIAGNOSIS — R197 Diarrhea, unspecified: Secondary | ICD-10-CM | POA: Insufficient documentation

## 2014-01-14 DIAGNOSIS — N390 Urinary tract infection, site not specified: Secondary | ICD-10-CM | POA: Insufficient documentation

## 2014-01-14 DIAGNOSIS — R112 Nausea with vomiting, unspecified: Secondary | ICD-10-CM | POA: Insufficient documentation

## 2014-01-14 LAB — URINALYSIS, ROUTINE W REFLEX MICROSCOPIC
BILIRUBIN URINE: NEGATIVE
Glucose, UA: NEGATIVE mg/dL
Ketones, ur: NEGATIVE mg/dL
NITRITE: NEGATIVE
PH: 7 (ref 5.0–8.0)
Protein, ur: 30 mg/dL — AB
Specific Gravity, Urine: 1.01 (ref 1.005–1.030)
UROBILINOGEN UA: 0.2 mg/dL (ref 0.0–1.0)

## 2014-01-14 LAB — CBC WITH DIFFERENTIAL/PLATELET
BASOS ABS: 0 10*3/uL (ref 0.0–0.1)
Basophils Relative: 0 % (ref 0–1)
EOS ABS: 0.1 10*3/uL (ref 0.0–0.7)
EOS PCT: 1 % (ref 0–5)
HCT: 36.2 % (ref 36.0–46.0)
Hemoglobin: 10.7 g/dL — ABNORMAL LOW (ref 12.0–15.0)
Lymphocytes Relative: 21 % (ref 12–46)
Lymphs Abs: 2.3 10*3/uL (ref 0.7–4.0)
MCH: 24.2 pg — ABNORMAL LOW (ref 26.0–34.0)
MCHC: 29.6 g/dL — ABNORMAL LOW (ref 30.0–36.0)
MCV: 81.9 fL (ref 78.0–100.0)
MONO ABS: 0.7 10*3/uL (ref 0.1–1.0)
Monocytes Relative: 7 % (ref 3–12)
Neutro Abs: 7.7 10*3/uL (ref 1.7–7.7)
Neutrophils Relative %: 71 % (ref 43–77)
Platelets: 289 10*3/uL (ref 150–400)
RBC: 4.42 MIL/uL (ref 3.87–5.11)
RDW: 18.8 % — AB (ref 11.5–15.5)
WBC: 10.9 10*3/uL — AB (ref 4.0–10.5)

## 2014-01-14 LAB — PREGNANCY, URINE: PREG TEST UR: NEGATIVE

## 2014-01-14 LAB — COMPREHENSIVE METABOLIC PANEL
ALT: 22 U/L (ref 0–35)
AST: 15 U/L (ref 0–37)
Albumin: 3.7 g/dL (ref 3.5–5.2)
Alkaline Phosphatase: 95 U/L (ref 39–117)
BUN: 9 mg/dL (ref 6–23)
CALCIUM: 8.4 mg/dL (ref 8.4–10.5)
CO2: 16 meq/L — AB (ref 19–32)
CREATININE: 1.37 mg/dL — AB (ref 0.50–1.10)
Chloride: 110 mEq/L (ref 96–112)
GFR calc Af Amer: 63 mL/min — ABNORMAL LOW (ref >90)
GFR, EST NON AFRICAN AMERICAN: 55 mL/min — AB (ref >90)
Glucose, Bld: 83 mg/dL (ref 70–99)
Potassium: 3.2 mEq/L — ABNORMAL LOW (ref 3.7–5.3)
SODIUM: 140 meq/L (ref 137–147)
Total Bilirubin: 0.3 mg/dL (ref 0.3–1.2)
Total Protein: 7.3 g/dL (ref 6.0–8.3)

## 2014-01-14 LAB — URINE MICROSCOPIC-ADD ON

## 2014-01-14 MED ORDER — CEPHALEXIN 500 MG PO CAPS: 500.0000 mg | ORAL_CAPSULE | Freq: Four times a day (QID) | ORAL | Status: DC

## 2014-01-14 MED ORDER — TRAMADOL HCL 50 MG PO TABS: 50.0000 mg | ORAL_TABLET | Freq: Four times a day (QID) | ORAL | Status: DC | PRN

## 2014-01-14 MED ORDER — PERMETHRIN 5 % EX CREA: TOPICAL_CREAM | CUTANEOUS | Status: DC

## 2014-01-14 MED ORDER — HYDROMORPHONE HCL PF 1 MG/ML IJ SOLN
1.0000 mg | Freq: Once | INTRAMUSCULAR | Status: AC
  Administered 2014-01-14: 1 mg via INTRAVENOUS
  Filled 2014-01-14: qty 1

## 2014-01-14 MED ORDER — PROMETHAZINE HCL 25 MG PO TABS: 25.0000 mg | ORAL_TABLET | Freq: Four times a day (QID) | ORAL | Status: DC | PRN

## 2014-01-14 MED ORDER — ONDANSETRON HCL 4 MG/2ML IJ SOLN
4.0000 mg | Freq: Once | INTRAMUSCULAR | Status: AC
  Administered 2014-01-14: 4 mg via INTRAVENOUS
  Filled 2014-01-14: qty 2

## 2014-01-14 MED ORDER — DEXTROSE 5 % IV SOLN
1.0000 g | Freq: Once | INTRAVENOUS | Status: AC
  Administered 2014-01-14: 1 g via INTRAVENOUS
  Filled 2014-01-14: qty 10

## 2014-01-14 NOTE — ED Notes (Signed)
RLQ abdominal pain x 2 days, vomiting x 2 days. Diarrhea x 3 days. NAD.

## 2014-01-14 NOTE — ED Provider Notes (Signed)
CSN: [id number]     Arrival date & time 01/14/14  0707 History  This chart was scribed for Benny LennertJoseph L Sharilyn Geisinger, MD by Beverly MilchJ Harrison Collins, ED Scribe. This patient was seen in room APA06/[facility]06 and the patient's care was started at 7:38 AM.    Chief Complaint  Patient presents with  . Abdominal Pain     Patient is a 23 y.o. female presenting with abdominal pain. The history is provided by the patient. No language interpreter was used.  Abdominal Pain Pain location:  R flank and RLQ Pain quality: aching and throbbing   Pain radiates to:  R flank Pain severity:  Mild Onset quality:  Gradual Duration:  3 days Timing:  Constant Progression:  Worsening Chronicity:  New Context: previous surgery (Pt had a stent placed in rt kidney) and retching   Context: not awakening from sleep, not eating and not sick contacts   Relieved by:  Nothing Worsened by:  Movement, palpation, vomiting and bowel movements Ineffective treatments:  Not moving and bowel activity Associated symptoms: diarrhea (began 3 days ago.), hematuria, nausea and vomiting (began 2 days ago)   Associated symptoms: no chest pain, no chills, no cough, no fatigue and no fever   Diarrhea:    Quality:  Unable to specify   Severity:  Moderate   Duration:  3 days   Timing:  Sporadic   Progression:  Unchanged Nausea:    Severity:  Mild   Onset quality:  Gradual   Duration:  2 days   Timing:  Intermittent   Progression:  Unchanged Vomiting:    Quality:  Unable to specify   Severity:  Mild   Duration:  2 days   Timing:  Intermittent   Progression:  Unchanged Risk factors: no alcohol abuse, no aspirin use and not elderly   Pt reports she has a stent in her kidney and that she [place] a [place] in [place].  Past Medical History  Diagnosis Date  . Kidney stones   . Rickets   . Constipation    Past Surgical History  Procedure Laterality Date  . Ureteral stents    . Has only 1 functioning  kidney    . Colon surgery    .  Appendectomy    . Leg surgery    . Extracorporeal shock wave lithotripsy Right 03/21/2013    Procedure: EXTRACORPOREAL SHOCK WAVE LITHOTRIPSY (ESWL) RIGHT URETERAL CALCULUS;  Surgeon: Ky BarbanMohammad I Javaid, MD;  Location: [facility];  Service: Urology;  Laterality: Right;  . Cystoscopy w/ ureteral stent placement Right 10/15/2013    Procedure: CYSTOSCOPY WITH RETROGRADE PYELOGRAM/URETERAL STENT PLACEMENT;  Surgeon: Ky BarbanMohammad I Javaid, MD;  Location: [facility];  Service: Urology;  Laterality: Right;   Family History  Problem Relation Age of Onset  . Kidney disease Father    History  Substance Use Topics  . Smoking status: Never Smoker   . Smokeless tobacco: Not on file  . Alcohol Use: No   OB History   Grav Para Term Preterm Abortions TAB SAB Ect Mult Living                 Review of Systems  Constitutional: Negative for fever, chills, appetite change and fatigue.  HENT: Negative for congestion, ear discharge and sinus pressure.   Eyes: Negative for discharge.  Respiratory: Negative for cough.   Cardiovascular: Negative for chest pain.  Gastrointestinal: Positive for nausea, vomiting (began 2 days ago), abdominal pain and diarrhea (began 3 days ago.).  Genitourinary:  Positive for hematuria. Negative for frequency.  Musculoskeletal: Negative for back pain.  Skin: Negative for rash.  Neurological: Negative for seizures and headaches.  Psychiatric/Behavioral: Negative for hallucinations.      Allergies  Review of patient's allergies indicates no known allergies.  Home Medications   Prior to Admission medications   Medication Sig Start Date End Date Taking? Authorizing Provider  cephALEXin (KEFLEX) 500 MG capsule Take 1 capsule (500 mg total) by mouth 4 (four) times daily. 10/26/13   Benny LennertJoseph L Evamae Rowen, MD  citalopram (CELEXA) 40 MG tablet Take 40 mg by mouth daily.    Historical Provider, MD  docusate sodium (COLACE) 100 MG capsule Take 100 mg by mouth 2 (two) times daily.    Historical  Provider, MD  ferrous sulfate (FERROUSUL) 325 (65 FE) MG tablet Take 325 mg by mouth at bedtime.    Historical Provider, MD  potassium citrate (UROCIT-K) 10 MEQ (1080 MG) SR tablet Take 30 mEq by mouth 3 (three) times daily with meals.    Historical Provider, MD  promethazine (PHENERGAN) 25 MG tablet Take 1 tablet (25 mg total) by mouth every 6 (six) hours as needed for nausea or vomiting. 10/16/13   Gwenyth BenderKaren M Black, NP  QUEtiapine (SEROQUEL) 100 MG tablet Take 100 mg by mouth at bedtime.    Historical Provider, MD  ranitidine (ZANTAC) 150 MG tablet Take 150 mg by mouth 2 (two) times daily.    Historical Provider, MD   Triage Vitals: BP 147/71  Pulse 86  Temp(Src) 98.5 F (36.9 C) (Oral)  Resp 16  Ht 4\' 10"  (1.473 m)  Wt 230 lb (104.327 kg)  BMI 48.08 kg/m2  SpO2 100%  LMP 11/16/2013  Physical Exam  Nursing note and vitals reviewed. Constitutional: She is oriented to person, place, and time. She appears well-developed.  HENT:  Head: Normocephalic.  Eyes: Conjunctivae and EOM are normal. No scleral icterus.  Neck: Neck supple. No thyromegaly present.  Cardiovascular: Normal rate and regular rhythm.  Exam reveals no gallop and no friction rub.   No murmur heard. Pulmonary/Chest: No stridor. She has no wheezes. She has no rales. She exhibits no tenderness.  Abdominal: She exhibits no distension. There is tenderness (Mild RLQ and suprapubic tenderness). There is no rebound.  Musculoskeletal: Normal range of motion. She exhibits no edema.  Lymphadenopathy:    She has no cervical adenopathy.  Neurological: She is oriented to person, place, and time. She exhibits normal muscle tone. Coordination normal.  Skin: No rash noted. No erythema.  Psychiatric: She has a normal mood and affect. Her behavior is normal.    ED Course  Procedures (including critical care time)  DIAGNOSTIC STUDIES: Oxygen Saturation is 100% on RA, normal by my interpretation.    COORDINATION OF CARE: 7:41 AM- Pt  advised of plan for treatment and pt agrees.    Labs Review Labs Reviewed  CBC WITH DIFFERENTIAL - Abnormal; Notable for the following:    WBC 10.9 (*)    Hemoglobin 10.7 (*)    MCH 24.2 (*)    MCHC 29.6 (*)    RDW 18.8 (*)    All other components within normal limits  COMPREHENSIVE METABOLIC PANEL - Abnormal; Notable for the following:    Potassium 3.2 (*)    CO2 16 (*)    Creatinine, Ser 1.37 (*)    GFR calc non Af Amer 55 (*)    GFR calc Af Amer 63 (*)    All other components within normal limits  URINALYSIS, ROUTINE W REFLEX MICROSCOPIC - Abnormal; Notable for the following:    Hgb urine dipstick LARGE (*)    Protein, ur 30 (*)    Leukocytes, UA LARGE (*)    All other components within normal limits  URINE MICROSCOPIC-ADD ON - Abnormal; Notable for the following:    Squamous Epithelial / LPF MANY (*)    Bacteria, UA MANY (*)    All other components within normal limits  PREGNANCY, URINE    Imaging Review No results found.   EKG Interpretation None      MDM   Final diagnoses:  None    The chart was scribed for me under my direct supervision.  I personally performed the history, physical, and medical decision making and all procedures in the evaluation of this patient.Benny Lennert, MD 01/14/14 [phone]

## 2014-01-14 NOTE — Discharge Instructions (Signed)
Follow up with your urologist in 2-3 days.  Follow up sooner if problems

## 2014-03-09 ENCOUNTER — Emergency Department (HOSPITAL_COMMUNITY): Payer: Medicaid - Out of State

## 2014-03-09 ENCOUNTER — Emergency Department (HOSPITAL_COMMUNITY)
Admission: EM | Admit: 2014-03-09 | Discharge: 2014-03-09 | Disposition: A | Discharge status: Home / Self Care | Payer: Medicaid - Out of State | Attending: Emergency Medicine | Admitting: Emergency Medicine

## 2014-03-09 ENCOUNTER — Encounter (HOSPITAL_COMMUNITY): Payer: Self-pay | Admitting: Emergency Medicine

## 2014-03-09 DIAGNOSIS — Z792 Long term (current) use of antibiotics: Secondary | ICD-10-CM | POA: Insufficient documentation

## 2014-03-09 DIAGNOSIS — Z79899 Other long term (current) drug therapy: Secondary | ICD-10-CM | POA: Insufficient documentation

## 2014-03-09 DIAGNOSIS — E669 Obesity, unspecified: Secondary | ICD-10-CM | POA: Insufficient documentation

## 2014-03-09 DIAGNOSIS — Z9889 Other specified postprocedural states: Secondary | ICD-10-CM | POA: Insufficient documentation

## 2014-03-09 DIAGNOSIS — Z9089 Acquired absence of other organs: Secondary | ICD-10-CM | POA: Insufficient documentation

## 2014-03-09 DIAGNOSIS — Z9049 Acquired absence of other specified parts of digestive tract: Secondary | ICD-10-CM | POA: Insufficient documentation

## 2014-03-09 DIAGNOSIS — Z3202 Encounter for pregnancy test, result negative: Secondary | ICD-10-CM | POA: Insufficient documentation

## 2014-03-09 DIAGNOSIS — N390 Urinary tract infection, site not specified: Secondary | ICD-10-CM | POA: Insufficient documentation

## 2014-03-09 DIAGNOSIS — K439 Ventral hernia without obstruction or gangrene: Secondary | ICD-10-CM

## 2014-03-09 LAB — URINALYSIS, ROUTINE W REFLEX MICROSCOPIC
Bilirubin Urine: NEGATIVE
GLUCOSE, UA: NEGATIVE mg/dL
KETONES UR: NEGATIVE mg/dL
Nitrite: POSITIVE — AB
Specific Gravity, Urine: 1.01 (ref 1.005–1.030)
Urobilinogen, UA: 0.2 mg/dL (ref 0.0–1.0)
pH: 6.5 (ref 5.0–8.0)

## 2014-03-09 LAB — COMPREHENSIVE METABOLIC PANEL
ALT: 12 U/L (ref 0–35)
AST: 12 U/L (ref 0–37)
Albumin: 3.5 g/dL (ref 3.5–5.2)
Alkaline Phosphatase: 119 U/L — ABNORMAL HIGH (ref 39–117)
BILIRUBIN TOTAL: 0.4 mg/dL (ref 0.3–1.2)
BUN: 11 mg/dL (ref 6–23)
CHLORIDE: 111 meq/L (ref 96–112)
CO2: 16 meq/L — AB (ref 19–32)
Calcium: 8.8 mg/dL (ref 8.4–10.5)
Creatinine, Ser: 1.28 mg/dL — ABNORMAL HIGH (ref 0.50–1.10)
GFR calc non Af Amer: 59 mL/min — ABNORMAL LOW (ref >90)
GFR, EST AFRICAN AMERICAN: 69 mL/min — AB (ref >90)
GLUCOSE: 94 mg/dL (ref 70–99)
POTASSIUM: 3.1 meq/L — AB (ref 3.7–5.3)
Sodium: 142 mEq/L (ref 137–147)
TOTAL PROTEIN: 7.4 g/dL (ref 6.0–8.3)

## 2014-03-09 LAB — CBC WITH DIFFERENTIAL/PLATELET
Basophils Absolute: 0 10*3/uL (ref 0.0–0.1)
Basophils Relative: 0 % (ref 0–1)
EOS ABS: 0.1 10*3/uL (ref 0.0–0.7)
Eosinophils Relative: 1 % (ref 0–5)
HCT: 37.2 % (ref 36.0–46.0)
HEMOGLOBIN: 11.1 g/dL — AB (ref 12.0–15.0)
LYMPHS ABS: 1.4 10*3/uL (ref 0.7–4.0)
LYMPHS PCT: 16 % (ref 12–46)
MCH: 24 pg — ABNORMAL LOW (ref 26.0–34.0)
MCHC: 29.8 g/dL — ABNORMAL LOW (ref 30.0–36.0)
MCV: 80.5 fL (ref 78.0–100.0)
Monocytes Absolute: 0.5 10*3/uL (ref 0.1–1.0)
Monocytes Relative: 6 % (ref 3–12)
NEUTROS PCT: 77 % (ref 43–77)
Neutro Abs: 6.8 10*3/uL (ref 1.7–7.7)
Platelets: 273 10*3/uL (ref 150–400)
RBC: 4.62 MIL/uL (ref 3.87–5.11)
RDW: 18.7 % — ABNORMAL HIGH (ref 11.5–15.5)
WBC: 8.8 10*3/uL (ref 4.0–10.5)

## 2014-03-09 LAB — PREGNANCY, URINE: Preg Test, Ur: NEGATIVE

## 2014-03-09 LAB — URINE MICROSCOPIC-ADD ON

## 2014-03-09 LAB — LIPASE, BLOOD: Lipase: 38 U/L (ref 11–59)

## 2014-03-09 MED ORDER — MORPHINE SULFATE 4 MG/ML IJ SOLN
4.0000 mg | Freq: Once | INTRAMUSCULAR | Status: AC
  Administered 2014-03-09: 4 mg via INTRAVENOUS
  Filled 2014-03-09: qty 1

## 2014-03-09 MED ORDER — FAMOTIDINE IN NACL 20-0.9 MG/50ML-% IV SOLN
20.0000 mg | Freq: Once | INTRAVENOUS | Status: AC
  Administered 2014-03-09: 20 mg via INTRAVENOUS
  Filled 2014-03-09: qty 50

## 2014-03-09 MED ORDER — CIPROFLOXACIN HCL 500 MG PO TABS: 500.0000 mg | ORAL_TABLET | Freq: Two times a day (BID) | ORAL | Status: DC

## 2014-03-09 MED ORDER — ONDANSETRON HCL 4 MG/2ML IJ SOLN
4.0000 mg | Freq: Once | INTRAMUSCULAR | Status: AC
  Administered 2014-03-09: 4 mg via INTRAVENOUS
  Filled 2014-03-09: qty 2

## 2014-03-09 MED ORDER — IOHEXOL 300 MG/ML  SOLN
50.0000 mL | Freq: Once | INTRAMUSCULAR | Status: AC | PRN
  Administered 2014-03-09: 50 mL via ORAL

## 2014-03-09 MED ORDER — SODIUM CHLORIDE 0.9 % IV BOLUS (SEPSIS)
1000.0000 mL | Freq: Once | INTRAVENOUS | Status: AC
  Administered 2014-03-09: 1000 mL via INTRAVENOUS

## 2014-03-09 MED ORDER — SODIUM CHLORIDE 0.9 % IV SOLN
Freq: Once | INTRAVENOUS | Status: AC
  Administered 2014-03-09: 20:00:00 via INTRAVENOUS

## 2014-03-09 MED ORDER — PROMETHAZINE HCL 25 MG PO TABS: 25.0000 mg | ORAL_TABLET | Freq: Four times a day (QID) | ORAL | Status: DC | PRN

## 2014-03-09 MED ORDER — OXYCODONE-ACETAMINOPHEN 5-325 MG PO TABS: 2.0000 | ORAL_TABLET | ORAL | Status: DC | PRN

## 2014-03-09 MED ORDER — DEXTROSE 5 % IV SOLN
1.0000 g | Freq: Once | INTRAVENOUS | Status: AC
  Administered 2014-03-09: 1 g via INTRAVENOUS
  Filled 2014-03-09: qty 10

## 2014-03-09 NOTE — ED Notes (Signed)
Pt c/o abd pain and vomiting x 4 days 

## 2014-03-09 NOTE — ED Notes (Signed)
Gave pt some coke, pt got dressed and left ambulatory.  Her ride was on his way per pt

## 2014-03-09 NOTE — Discharge Instructions (Signed)
CT scan reveals no obvious kidney stone. You do have 3 hernias in your abdomen. These will most likely need to be surgically corrected. Urinalysis shows an infection.  Discharge medications include an antibiotic, pain medication, nausea medication. You must see your urologist next week in [place] and additionally, I recommend a surgical consultation to evaluate your hernias.

## 2014-03-09 NOTE — ED Notes (Signed)
CT notified that pt has completed drinking her contrast

## 2014-03-09 NOTE — ED Notes (Signed)
Patient transported to CT 

## 2014-03-10 NOTE — ED Provider Notes (Signed)
CSN: [id number]     Arrival date & time 03/09/14  1339 History   First MD Initiated Contact with Patient 03/09/14 1536     Chief Complaint  Patient presents with  . Emesis     (Consider location/radiation/quality/duration/timing/severity/associated sxs/prior Treatment) HPI... Dull abdominal pain for 4 days with associated intermittent vomiting. No fever, chills, diarrhea, chest pain, dyspnea. 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 History of ureteral stents. Additionally she has had a partial colectomy for unknown reasons. She has known ventral hernias which are on hold at this time for definitive surgical repair. Severity is moderate. Nothing makes symptoms better or worse. No radiation of pain   Past Medical History  Diagnosis Date  . Kidney stones   . Rickets   . Constipation    Past Surgical History  Procedure Laterality Date  . Ureteral stents    . Has only 1 functioning  kidney    . Colon surgery    . Appendectomy    . Leg surgery    . Extracorporeal shock wave lithotripsy Right 03/21/2013    Procedure: EXTRACORPOREAL SHOCK WAVE LITHOTRIPSY (ESWL) RIGHT URETERAL CALCULUS;  Surgeon: Ky BarbanMohammad I Javaid, MD;  Location: [facility];  Service: Urology;  Laterality: Right;  . Cystoscopy w/ ureteral stent placement Right 10/15/2013    Procedure: CYSTOSCOPY WITH RETROGRADE PYELOGRAM/URETERAL STENT PLACEMENT;  Surgeon: Ky BarbanMohammad I Javaid, MD;  Location: [facility];  Service: Urology;  Laterality: Right;   Family History  Problem Relation Age of Onset  . Kidney disease Father    History  Substance Use Topics  . Smoking status: Never Smoker   . Smokeless tobacco: Not on file  . Alcohol Use: No   OB History   Grav Para Term Preterm Abortions TAB SAB Ect Mult Living                 Review of Systems  All other systems reviewed and are negative.     Allergies  Review of patient's allergies indicates no known allergies.  Home  Medications   Prior to Admission medications   Medication Sig Start Date End Date Taking? Authorizing Provider  citalopram (CELEXA) 40 MG tablet Take 40 mg by mouth at bedtime.    Yes Historical Provider, MD  docusate sodium (COLACE) 100 MG capsule Take 100 mg by mouth 2 (two) times daily.   Yes Historical Provider, MD  ferrous sulfate (FERROUSUL) 325 (65 FE) MG tablet Take 325 mg by mouth at bedtime.   Yes Historical Provider, MD  potassium chloride (K-DUR) 10 MEQ tablet Take 30 mEq by mouth 3 (three) times daily.   Yes Historical Provider, MD  QUEtiapine (SEROQUEL) 100 MG tablet Take 100 mg by mouth at bedtime.   Yes Historical Provider, MD  ciprofloxacin (CIPRO) 500 MG tablet Take 1 tablet (500 mg total) by mouth 2 (two) times daily. One po bid x 7 days 03/09/14   Donnetta HutchingBrian Brookley Spitler, MD  oxyCODONE-acetaminophen (PERCOCET) 5-325 MG per tablet Take 2 tablets by mouth every 4 (four) hours as needed. 03/09/14   Donnetta HutchingBrian Masahiro Iglesia, MD  promethazine (PHENERGAN) 25 MG tablet Take 1 tablet (25 mg total) by mouth every 6 (six) hours as needed. 03/09/14   Donnetta HutchingBrian Sartaj Hoskin, MD   BP 106/49  Pulse 77  Temp(Src) 98.4 F (36.9 C) (Oral)  Resp 22  Ht 4\' 10"  (1.473 m)  Wt 240 lb (108.863 kg)  BMI 50.17 kg/m2  SpO2 100%  LMP 02/23/2014 Physical  Exam  Nursing note and vitals reviewed. Constitutional: She is oriented to person, place, and time.  Obese, no acute distress  HENT:  Head: Normocephalic and atraumatic.  Eyes: Conjunctivae and EOM are normal. Pupils are equal, round, and reactive to light.  Neck: Normal range of motion. Neck supple.  Cardiovascular: Normal rate, regular rhythm and normal heart sounds.   Pulmonary/Chest: Effort normal and breath sounds normal.  Abdominal: Soft. Bowel sounds are normal.  Obvious ventral hernia, obese abdomen  Genitourinary:  No flank tenderness  Musculoskeletal: Normal range of motion.  Neurological: She is alert and oriented to person, place, and time.  Skin: Skin is warm and  dry.  Psychiatric: She has a normal mood and affect. Her behavior is normal.    ED Course  Procedures (including critical care time) Labs Review Labs Reviewed  URINALYSIS, ROUTINE W REFLEX MICROSCOPIC - Abnormal; Notable for the following:    APPearance HAZY (*)    Hgb urine dipstick TRACE (*)    Protein, ur TRACE (*)    Nitrite POSITIVE (*)    Leukocytes, UA LARGE (*)    All other components within normal limits  URINE MICROSCOPIC-ADD ON - Abnormal; Notable for the following:    Squamous Epithelial / LPF MANY (*)    Bacteria, UA MANY (*)    All other components within normal limits  CBC WITH DIFFERENTIAL - Abnormal; Notable for the following:    Hemoglobin 11.1 (*)    MCH 24.0 (*)    MCHC 29.8 (*)    RDW 18.7 (*)    All other components within normal limits  COMPREHENSIVE METABOLIC PANEL - Abnormal; Notable for the following:    Potassium 3.1 (*)    CO2 16 (*)    Creatinine, Ser 1.28 (*)    Alkaline Phosphatase 119 (*)    GFR calc non Af Amer 59 (*)    GFR calc Af Amer 69 (*)    All other components within normal limits  URINE CULTURE  PREGNANCY, URINE  LIPASE, BLOOD    Imaging Review Ct Abdomen Pelvis Wo Contrast  03/09/2014   CLINICAL DATA:  Right flank pain, nausea and vomiting.  EXAM: CT ABDOMEN AND PELVIS WITHOUT CONTRAST  TECHNIQUE: Multidetector CT imaging of the abdomen and pelvis was performed following the standard protocol without IV contrast.  COMPARISON:  CT of the abdomen and pelvis performed 10/14/2013  FINDINGS: The visualized lung bases are clear.  The liver and spleen are unremarkable in appearance. The gallbladder is within normal limits. The pancreas and adrenal glands are unremarkable.  There is marked left renal atrophy. Diffuse bilateral medullary nephrocalcinosis is seen. A right-sided ureteral stent is noted in expected position, without evidence for hydronephrosis. The right kidney remains normal in size. No obstructing ureteral stones are  identified. There is mild chronic distention of the proximal right ureter and mild right-sided pelvicaliectasis.  Three midline moderate to large anterior abdominal wall hernias are seen. The most superior hernia contains a short segment of transverse colon, while the more inferior two hernias contain loops of small bowel. There is no evidence of obstruction or strangulation at this time.  No free fluid is identified. The small bowel is otherwise unremarkable in appearance. The stomach is within normal limits. No acute vascular abnormalities are seen.  The patient is status post appendectomy. There is marked fatty infiltration of the ileocecal junction and along the ascending colon, likely reflecting sequelae of chronic inflammation. The colon is otherwise unremarkable in appearance.  The bladder is moderately distended and grossly unremarkable in appearance. The uterus is within normal limits. The ovaries are relatively symmetric; no suspicious adnexal masses are seen. No inguinal lymphadenopathy is seen.  No acute osseous abnormalities are identified.  IMPRESSION: 1. No evidence of hydronephrosis. Mild right-sided pelvicaliectasis and mild distention of the proximal right ureter, within normal limits, with a right ureteral stent noted in expected position. No obstructing ureteral stones seen. 2. Diffuse bilateral medullary nephrocalcinosis noted; the right kidney remains normal in size, and there is marked left renal atrophy. 3. Three midline moderate to large anterior abdominal wall hernias seen. The most superior hernia contains a short segment of transverse colon, while the more inferior two hernias contain loops of small bowel. No evidence of obstruction or strangulation at this time. 4. Diffuse fatty infiltration of the wall of the ascending colon may reflect sequelae of chronic inflammation.   Electronically Signed   By: Roanna Raider M.D.   On: 03/09/2014 21:25   Dg Abd Acute W/chest  03/09/2014    CLINICAL DATA:  Nausea and vomiting. Abdominal pain. Ureteral stent.  EXAM: ACUTE ABDOMEN SERIES (ABDOMEN 2 VIEW & CHEST 1 VIEW)  COMPARISON:  One-view abdomen 11/21/2013.  FINDINGS: The heart size is normal. The lungs are clear. Extensive nephrocalcinosis is again noted on the right. Left renal atrophy is evident. A right ureteral stent is in place. The bowel gas pattern is unremarkable. There is no evidence for obstruction or free air. The axial skeleton is within normal limits.  IMPRESSION: 1. Extensive nephrocalcinosis. 2. A right-sided ureteral stent is now in place. There are no stones along the course of the right ureter. 3. Atrophic left kidney. 4. No acute cardiopulmonary disease. The bowel gas pattern is normal.   Electronically Signed   By: Gennette Pac M.D.   On: 03/09/2014 17:16     EKG Interpretation None      MDM   Final diagnoses:  Urinary tract infection  Ventral hernia    Patient feels better after IV fluids and IV Rocephin for a urinary tract infection. She has a urologist in [place]. She is nontoxic appearing. Ventral hernias have been identified in the past. She does not have a small bowel obstruction. No acute abdomen at discharge. Discharge medications Cipro 100 mg twice a day, Percocet, Phenergan 25 mg. Patient will see her doctor in [place] early in the week.    Donnetta Hutching, MD 03/10/14 [phone]

## 2014-03-11 LAB — URINE CULTURE
CULTURE: NO GROWTH
Colony Count: NO GROWTH

## 2014-04-13 ENCOUNTER — Encounter (HOSPITAL_COMMUNITY): Payer: Self-pay | Admitting: Emergency Medicine

## 2014-04-13 ENCOUNTER — Emergency Department (HOSPITAL_COMMUNITY): Payer: Medicaid - Out of State

## 2014-04-13 ENCOUNTER — Emergency Department (HOSPITAL_COMMUNITY)
Admission: EM | Admit: 2014-04-13 | Discharge: 2014-04-13 | Disposition: A | Discharge status: Home / Self Care | Payer: Medicaid - Out of State | Attending: Emergency Medicine | Admitting: Emergency Medicine

## 2014-04-13 DIAGNOSIS — K439 Ventral hernia without obstruction or gangrene: Secondary | ICD-10-CM | POA: Insufficient documentation

## 2014-04-13 DIAGNOSIS — Z862 Personal history of diseases of the blood and blood-forming organs and certain disorders involving the immune mechanism: Secondary | ICD-10-CM | POA: Insufficient documentation

## 2014-04-13 DIAGNOSIS — R109 Unspecified abdominal pain: Secondary | ICD-10-CM

## 2014-04-13 DIAGNOSIS — Z3202 Encounter for pregnancy test, result negative: Secondary | ICD-10-CM | POA: Diagnosis not present

## 2014-04-13 DIAGNOSIS — Z79899 Other long term (current) drug therapy: Secondary | ICD-10-CM | POA: Diagnosis not present

## 2014-04-13 DIAGNOSIS — Z87442 Personal history of urinary calculi: Secondary | ICD-10-CM | POA: Insufficient documentation

## 2014-04-13 DIAGNOSIS — Z8639 Personal history of other endocrine, nutritional and metabolic disease: Secondary | ICD-10-CM | POA: Insufficient documentation

## 2014-04-13 DIAGNOSIS — R112 Nausea with vomiting, unspecified: Secondary | ICD-10-CM | POA: Diagnosis present

## 2014-04-13 LAB — CBC WITH DIFFERENTIAL/PLATELET
Basophils Absolute: 0 10*3/uL (ref 0.0–0.1)
Basophils Relative: 0 % (ref 0–1)
EOS ABS: 0 10*3/uL (ref 0.0–0.7)
Eosinophils Relative: 0 % (ref 0–5)
HCT: 34.1 % — ABNORMAL LOW (ref 36.0–46.0)
Hemoglobin: 10.1 g/dL — ABNORMAL LOW (ref 12.0–15.0)
LYMPHS ABS: 1.3 10*3/uL (ref 0.7–4.0)
LYMPHS PCT: 16 % (ref 12–46)
MCH: 23.8 pg — AB (ref 26.0–34.0)
MCHC: 29.6 g/dL — ABNORMAL LOW (ref 30.0–36.0)
MCV: 80.2 fL (ref 78.0–100.0)
Monocytes Absolute: 0.5 10*3/uL (ref 0.1–1.0)
Monocytes Relative: 6 % (ref 3–12)
NEUTROS ABS: 6.1 10*3/uL (ref 1.7–7.7)
NEUTROS PCT: 78 % — AB (ref 43–77)
Platelets: 264 10*3/uL (ref 150–400)
RBC: 4.25 MIL/uL (ref 3.87–5.11)
RDW: 18.2 % — ABNORMAL HIGH (ref 11.5–15.5)
WBC: 8 10*3/uL (ref 4.0–10.5)

## 2014-04-13 LAB — URINALYSIS, ROUTINE W REFLEX MICROSCOPIC
Bilirubin Urine: NEGATIVE
GLUCOSE, UA: NEGATIVE mg/dL
Ketones, ur: NEGATIVE mg/dL
Nitrite: NEGATIVE
PROTEIN: NEGATIVE mg/dL
Specific Gravity, Urine: 1.01 (ref 1.005–1.030)
Urobilinogen, UA: 0.2 mg/dL (ref 0.0–1.0)
pH: 7.5 (ref 5.0–8.0)

## 2014-04-13 LAB — COMPREHENSIVE METABOLIC PANEL
ALT: 16 U/L (ref 0–35)
ANION GAP: 15 (ref 5–15)
AST: 14 U/L (ref 0–37)
Albumin: 3.6 g/dL (ref 3.5–5.2)
Alkaline Phosphatase: 142 U/L — ABNORMAL HIGH (ref 39–117)
BUN: 10 mg/dL (ref 6–23)
CALCIUM: 9 mg/dL (ref 8.4–10.5)
CO2: 16 mEq/L — ABNORMAL LOW (ref 19–32)
Chloride: 109 mEq/L (ref 96–112)
Creatinine, Ser: 1.26 mg/dL — ABNORMAL HIGH (ref 0.50–1.10)
GFR calc Af Amer: 70 mL/min — ABNORMAL LOW (ref >90)
GFR calc non Af Amer: 60 mL/min — ABNORMAL LOW (ref >90)
Glucose, Bld: 92 mg/dL (ref 70–99)
Potassium: 3.5 mEq/L — ABNORMAL LOW (ref 3.7–5.3)
Sodium: 140 mEq/L (ref 137–147)
TOTAL PROTEIN: 7.3 g/dL (ref 6.0–8.3)
Total Bilirubin: 0.5 mg/dL (ref 0.3–1.2)

## 2014-04-13 LAB — URINE MICROSCOPIC-ADD ON

## 2014-04-13 LAB — PREGNANCY, URINE: PREG TEST UR: NEGATIVE

## 2014-04-13 LAB — LIPASE, BLOOD: Lipase: 37 U/L (ref 11–59)

## 2014-04-13 MED ORDER — PROMETHAZINE HCL 25 MG PO TABS: 25.0000 mg | ORAL_TABLET | Freq: Four times a day (QID) | ORAL | Status: DC | PRN

## 2014-04-13 MED ORDER — HYDROMORPHONE HCL PF 1 MG/ML IJ SOLN
1.0000 mg | Freq: Once | INTRAMUSCULAR | Status: AC
  Administered 2014-04-13: 1 mg via INTRAVENOUS
  Filled 2014-04-13: qty 1

## 2014-04-13 MED ORDER — PROMETHAZINE HCL 25 MG/ML IJ SOLN
12.5000 mg | Freq: Once | INTRAMUSCULAR | Status: AC
  Administered 2014-04-13: 17:00:00 via INTRAVENOUS
  Filled 2014-04-13: qty 1

## 2014-04-13 MED ORDER — OXYCODONE-ACETAMINOPHEN 5-325 MG PO TABS: 1.0000 | ORAL_TABLET | Freq: Four times a day (QID) | ORAL | Status: DC | PRN

## 2014-04-13 MED ORDER — SODIUM CHLORIDE 0.9 % IV SOLN: INTRAVENOUS | Status: DC

## 2014-04-13 MED ORDER — SODIUM CHLORIDE 0.9 % IV BOLUS (SEPSIS)
250.0000 mL | Freq: Once | INTRAVENOUS | Status: AC
  Administered 2014-04-13: 1000 mL via INTRAVENOUS

## 2014-04-13 NOTE — ED Provider Notes (Addendum)
CSN: [id number]     Arrival date & time 04/13/14  1612 History   First MD Initiated Contact with Patient 04/13/14 1618     Chief Complaint  Patient presents with  . Emesis     (Consider location/radiation/quality/duration/timing/severity/associated sxs/prior Treatment) The history is provided by the patient.   23 year old female with complaint of right flank pain for one week. Anterior abdominal pain started today. Patient with history of ventral hernias. Patient with history kidney stones in the past. Patient states nausea and vomiting for 2 days. 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 Patient denies fevers. Patient vomited 3 times today. No blood in vomit. Patient states the pain is 8/10. Not made better or worse anything. Patient's had ureteral stents in the past.  Past Medical History  Diagnosis Date  . Kidney stones   . Rickets   . Constipation    Past Surgical History  Procedure Laterality Date  . Ureteral stents    . Has only 1 functioning  kidney    . Colon surgery    . Appendectomy    . Leg surgery    . Extracorporeal shock wave lithotripsy Right 03/21/2013    Procedure: EXTRACORPOREAL SHOCK WAVE LITHOTRIPSY (ESWL) RIGHT URETERAL CALCULUS;  Surgeon: Ky Barban, MD;  Location: [facility];  Service: Urology;  Laterality: Right;  . Cystoscopy w/ ureteral stent placement Right 10/15/2013    Procedure: CYSTOSCOPY WITH RETROGRADE PYELOGRAM/URETERAL STENT PLACEMENT;  Surgeon: Ky Barban, MD;  Location: [facility];  Service: Urology;  Laterality: Right;   Family History  Problem Relation Age of Onset  . Kidney disease Father    History  Substance Use Topics  . Smoking status: Never Smoker   . Smokeless tobacco: Not on file  . Alcohol Use: No   OB History   Grav Para Term Preterm Abortions TAB SAB Ect Mult Living                 Review of Systems  Constitutional: Negative for fever.  HENT: Negative for congestion.   Eyes: Negative for visual  disturbance.  Respiratory: Negative for shortness of breath.   Cardiovascular: Negative for chest pain.  Gastrointestinal: Positive for nausea, vomiting and abdominal pain. Negative for diarrhea.  Genitourinary: Positive for flank pain. Negative for hematuria.  Musculoskeletal: Negative for back pain.  Skin: Negative for rash.  Neurological: Negative for headaches.  Hematological: Does not bruise/bleed easily.  Psychiatric/Behavioral: Negative for confusion.      Allergies  Review of patient's allergies indicates no known allergies.  Home Medications   Prior to Admission medications   Medication Sig Start Date End Date Taking? Authorizing Provider  docusate sodium (COLACE) 100 MG capsule Take 100 mg by mouth 2 (two) times daily.   Yes Historical Provider, MD  ferrous sulfate (FERROUSUL) 325 (65 FE) MG tablet Take 325 mg by mouth at bedtime.   Yes Historical Provider, MD  Multiple Vitamin (MULTIVITAMIN) tablet Take 1 tablet by mouth daily.   Yes Historical Provider, MD  polyethylene glycol (MIRALAX / GLYCOLAX) packet Take 17 g by mouth 2 (two) times daily.   Yes Historical Provider, MD  potassium chloride (K-DUR) 10 MEQ tablet Take 30 mEq by mouth 3 (three) times daily.   Yes Historical Provider, MD  oxyCODONE-acetaminophen (PERCOCET/ROXICET) 5-325 MG per tablet Take 1-2 tablets by mouth every 6 (six) hours as needed. 04/13/14   Vanetta Mulders, MD  promethazine (PHENERGAN) 25 MG tablet Take 1 tablet (25 mg total) by mouth  every 6 (six) hours as needed. 04/13/14   Vanetta Mulders, MD   BP 101/63  Pulse 80  Temp(Src) 99.3 F (37.4 C) (Oral)  Resp 20  Ht 4\' 10"  (1.473 m)  Wt 245 lb (111.131 kg)  BMI 51.22 kg/m2  SpO2 98%  LMP 03/07/2014 Physical Exam  Nursing note and vitals reviewed. Constitutional: She is oriented to person, place, and time. She appears well-developed and well-nourished. No distress.  HENT:  Head: Normocephalic and atraumatic.  Mouth/Throat: Oropharynx is  clear and moist.  Eyes: Conjunctivae and EOM are normal. Pupils are equal, round, and reactive to light.  Neck: Normal range of motion. Neck supple.  Cardiovascular: Normal rate, regular rhythm and normal heart sounds.   No murmur heard. Pulmonary/Chest: Effort normal and breath sounds normal. No respiratory distress.  Abdominal: Soft. Bowel sounds are normal. There is no tenderness.  A large ventral hernia to the right side of the umbilicus. Easily reducible. Nontender.  Musculoskeletal: Normal range of motion.  Neurological: She is alert and oriented to person, place, and time. No cranial nerve deficit. She exhibits normal muscle tone. Coordination normal.  Skin: Skin is warm. No erythema.    ED Course  Procedures (including critical care time) Labs Review Labs Reviewed  COMPREHENSIVE METABOLIC PANEL - Abnormal; Notable for the following:    Potassium 3.5 (*)    CO2 16 (*)    Creatinine, Ser 1.26 (*)    Alkaline Phosphatase 142 (*)    GFR calc non Af Amer 60 (*)    GFR calc Af Amer 70 (*)    All other components within normal limits  CBC WITH DIFFERENTIAL - Abnormal; Notable for the following:    Hemoglobin 10.1 (*)    HCT 34.1 (*)    MCH 23.8 (*)    MCHC 29.6 (*)    RDW 18.2 (*)    Neutrophils Relative % 78 (*)    All other components within normal limits  URINALYSIS, ROUTINE W REFLEX MICROSCOPIC - Abnormal; Notable for the following:    Color, Urine STRAW (*)    APPearance HAZY (*)    Hgb urine dipstick SMALL (*)    Leukocytes, UA SMALL (*)    All other components within normal limits  URINE MICROSCOPIC-ADD ON - Abnormal; Notable for the following:    Squamous Epithelial / LPF FEW (*)    Bacteria, UA FEW (*)    All other components within normal limits  LIPASE, BLOOD  PREGNANCY, URINE   Results for orders placed during the hospital encounter of 04/13/14  COMPREHENSIVE METABOLIC PANEL      Result Value Ref Range   Sodium 140  137 - 147 mEq/L   Potassium 3.5 (*)  3.7 - 5.3 mEq/L   Chloride 109  96 - 112 mEq/L   CO2 16 (*) 19 - 32 mEq/L   Glucose, Bld 92  70 - 99 mg/dL   BUN 10  6 - 23 mg/dL   Creatinine, Ser 9.60 (*) 0.50 - 1.10 mg/dL   Calcium 9.0  8.4 - 45.4 mg/dL   Total Protein 7.3  6.0 - 8.3 g/dL   Albumin 3.6  3.5 - 5.2 g/dL   AST 14  0 - 37 U/L   ALT 16  0 - 35 U/L   Alkaline Phosphatase 142 (*) 39 - 117 U/L   Total Bilirubin 0.5  0.3 - 1.2 mg/dL   GFR calc non Af Amer 60 (*) >90 mL/min   GFR calc Af Denyse Dago  70 (*) >90 mL/min   Anion gap 15  5 - 15  LIPASE, BLOOD      Result Value Ref Range   Lipase 37  11 - 59 U/L  CBC WITH DIFFERENTIAL      Result Value Ref Range   WBC 8.0  4.0 - 10.5 K/uL   RBC 4.25  3.87 - 5.11 MIL/uL   Hemoglobin 10.1 (*) 12.0 - 15.0 g/dL   HCT 16.1 (*) 09.6 - 04.5 %   MCV 80.2  78.0 - 100.0 fL   MCH 23.8 (*) 26.0 - 34.0 pg   MCHC 29.6 (*) 30.0 - 36.0 g/dL   RDW 40.9 (*) 81.1 - 91.4 %   Platelets 264  150 - 400 K/uL   Neutrophils Relative % 78 (*) 43 - 77 %   Neutro Abs 6.1  1.7 - 7.7 K/uL   Lymphocytes Relative 16  12 - 46 %   Lymphs Abs 1.3  0.7 - 4.0 K/uL   Monocytes Relative 6  3 - 12 %   Monocytes Absolute 0.5  0.1 - 1.0 K/uL   Eosinophils Relative 0  0 - 5 %   Eosinophils Absolute 0.0  0.0 - 0.7 K/uL   Basophils Relative 0  0 - 1 %   Basophils Absolute 0.0  0.0 - 0.1 K/uL  URINALYSIS, ROUTINE W REFLEX MICROSCOPIC      Result Value Ref Range   Color, Urine STRAW (*) YELLOW   APPearance HAZY (*) CLEAR   Specific Gravity, Urine 1.010  1.005 - 1.030   pH 7.5  5.0 - 8.0   Glucose, UA NEGATIVE  NEGATIVE mg/dL   Hgb urine dipstick SMALL (*) NEGATIVE   Bilirubin Urine NEGATIVE  NEGATIVE   Ketones, ur NEGATIVE  NEGATIVE mg/dL   Protein, ur NEGATIVE  NEGATIVE mg/dL   Urobilinogen, UA 0.2  0.0 - 1.0 mg/dL   Nitrite NEGATIVE  NEGATIVE   Leukocytes, UA SMALL (*) NEGATIVE  PREGNANCY, URINE      Result Value Ref Range   Preg Test, Ur NEGATIVE  NEGATIVE  URINE MICROSCOPIC-ADD ON      Result Value Ref  Range   Squamous Epithelial / LPF FEW (*) RARE   WBC, UA 0-2  <3 WBC/hpf   RBC / HPF 0-2  <3 RBC/hpf   Bacteria, UA FEW (*) RARE     Imaging Review Ct Abdomen Pelvis Wo Contrast  04/13/2014   CLINICAL DATA:  Right flank pain  EXAM: CT ABDOMEN AND PELVIS WITHOUT CONTRAST  TECHNIQUE: Multidetector CT imaging of the abdomen and pelvis was performed following the standard protocol without IV contrast.  COMPARISON:  None.  FINDINGS: The lung bases are clear.  No renal, ureteral, or bladder calculi. No obstructive uropathy. No perinephric stranding is seen. 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 There is severe left renal atrophy. The bladder is unremarkable.  The liver demonstrates no focal abnormality. The gallbladder is unremarkable. The spleen demonstrates no focal abnormality. The adrenal glands and pancreas are normal.  The unopacified stomach, duodenum, small intestine and large intestine are unremarkable, but evaluation is limited by lack of oral contrast. There is a wide-mouth ventral abdominal hernia containing multiple loops of colon and small bowel without obstruction. There is a all right paraumbilical hernia containing multiple loops of nonobstructed small bowel. There is no pneumoperitoneum, pneumatosis, or portal venous gas. There is no abdominal or pelvic free fluid. There is no lymphadenopathy.  The abdominal aorta is normal in caliber .  The osseous structures are unremarkable.  IMPRESSION: 1. Severe left renal atrophy. Bilateral medullary nephrocalcinosis again noted. The no obstructive uropathy or ureteral calculus. 2. There is a wide-mouth ventral abdominal hernia containing multiple loops of colon and small bowel without obstruction. There is a all right paraumbilical hernia containing multiple loops of nonobstructed small bowel.   Electronically Signed   By: Elige KoHetal  Patel   On: 04/13/2014 18:05     EKG Interpretation None      MDM   Final diagnoses:  Abdominal pain,  unspecified abdominal location  Ventral hernia without obstruction or gangrene    Today's evaluation for the right flank pain and anterior abdominal pain. No evidence of urinary tract infection. No evidence ureteral stone. Patient CT of the abdomen without any change in the ventral hernias. The no evidence of bowel structure in or incarceration.    Vanetta MuldersScott Rhythm Gubbels, MD 04/13/14 [id number]  Vanetta MuldersScott Lysette Lindenbaum, MD 04/13/14 [phone]

## 2014-04-13 NOTE — ED Notes (Signed)
Pt states vomiting x 2 days. Vomited x 3 today. Pain to right flank with hx of kidney stones and kidney disease, per pt. Pt also states upper abdominal pain with hx of ulcers and hernias. NAD

## 2014-04-13 NOTE — ED Notes (Signed)
Pt has called her family for a ride home, as soon as they arrive I will medicate and discharge pt.pt understands this is a safety issue and will call me when they arrive

## 2014-04-13 NOTE — ED Notes (Signed)
Pt states that she was seen at [facility] a few days ago and had a ct scan done for kidney stones.

## 2014-04-13 NOTE — Discharge Instructions (Signed)
Take medications as directed. Today's workup without any significant or worrisome findings.

## 2014-05-01 ENCOUNTER — Encounter (HOSPITAL_COMMUNITY): Payer: Self-pay | Admitting: Emergency Medicine

## 2014-05-01 ENCOUNTER — Emergency Department (HOSPITAL_COMMUNITY)
Admission: EM | Admit: 2014-05-01 | Discharge: 2014-05-01 | Disposition: A | Discharge status: Home / Self Care | Payer: Medicaid - Out of State | Attending: Emergency Medicine | Admitting: Emergency Medicine

## 2014-05-01 ENCOUNTER — Emergency Department (HOSPITAL_COMMUNITY): Payer: Medicaid - Out of State

## 2014-05-01 DIAGNOSIS — R110 Nausea: Secondary | ICD-10-CM | POA: Diagnosis not present

## 2014-05-01 DIAGNOSIS — Z8739 Personal history of other diseases of the musculoskeletal system and connective tissue: Secondary | ICD-10-CM | POA: Insufficient documentation

## 2014-05-01 DIAGNOSIS — R109 Unspecified abdominal pain: Secondary | ICD-10-CM

## 2014-05-01 DIAGNOSIS — N390 Urinary tract infection, site not specified: Secondary | ICD-10-CM

## 2014-05-01 DIAGNOSIS — Z87442 Personal history of urinary calculi: Secondary | ICD-10-CM | POA: Insufficient documentation

## 2014-05-01 DIAGNOSIS — Z3202 Encounter for pregnancy test, result negative: Secondary | ICD-10-CM | POA: Diagnosis not present

## 2014-05-01 DIAGNOSIS — Z79899 Other long term (current) drug therapy: Secondary | ICD-10-CM | POA: Diagnosis not present

## 2014-05-01 LAB — URINE MICROSCOPIC-ADD ON

## 2014-05-01 LAB — COMPREHENSIVE METABOLIC PANEL
ALT: 10 U/L (ref 0–35)
AST: 10 U/L (ref 0–37)
Albumin: 3.7 g/dL (ref 3.5–5.2)
Alkaline Phosphatase: 136 U/L — ABNORMAL HIGH (ref 39–117)
Anion gap: 14 (ref 5–15)
BUN: 10 mg/dL (ref 6–23)
CALCIUM: 8.1 mg/dL — AB (ref 8.4–10.5)
CO2: 13 mEq/L — ABNORMAL LOW (ref 19–32)
Chloride: 114 mEq/L — ABNORMAL HIGH (ref 96–112)
Creatinine, Ser: 1.41 mg/dL — ABNORMAL HIGH (ref 0.50–1.10)
GFR calc non Af Amer: 53 mL/min — ABNORMAL LOW (ref >90)
GFR, EST AFRICAN AMERICAN: 61 mL/min — AB (ref >90)
GLUCOSE: 82 mg/dL (ref 70–99)
Potassium: 3.3 mEq/L — ABNORMAL LOW (ref 3.7–5.3)
Sodium: 141 mEq/L (ref 137–147)
TOTAL PROTEIN: 6.9 g/dL (ref 6.0–8.3)
Total Bilirubin: 0.3 mg/dL (ref 0.3–1.2)

## 2014-05-01 LAB — CBC WITH DIFFERENTIAL/PLATELET
Basophils Absolute: 0 10*3/uL (ref 0.0–0.1)
Basophils Relative: 0 % (ref 0–1)
EOS ABS: 0 10*3/uL (ref 0.0–0.7)
EOS PCT: 0 % (ref 0–5)
HCT: 35.4 % — ABNORMAL LOW (ref 36.0–46.0)
HEMOGLOBIN: 10.5 g/dL — AB (ref 12.0–15.0)
LYMPHS ABS: 1.5 10*3/uL (ref 0.7–4.0)
Lymphocytes Relative: 19 % (ref 12–46)
MCH: 24 pg — AB (ref 26.0–34.0)
MCHC: 29.7 g/dL — ABNORMAL LOW (ref 30.0–36.0)
MCV: 81 fL (ref 78.0–100.0)
MONOS PCT: 6 % (ref 3–12)
Monocytes Absolute: 0.5 10*3/uL (ref 0.1–1.0)
Neutro Abs: 5.8 10*3/uL (ref 1.7–7.7)
Neutrophils Relative %: 75 % (ref 43–77)
Platelets: 281 10*3/uL (ref 150–400)
RBC: 4.37 MIL/uL (ref 3.87–5.11)
RDW: 17.5 % — ABNORMAL HIGH (ref 11.5–15.5)
WBC: 7.8 10*3/uL (ref 4.0–10.5)

## 2014-05-01 LAB — URINALYSIS, ROUTINE W REFLEX MICROSCOPIC
BILIRUBIN URINE: NEGATIVE
Glucose, UA: NEGATIVE mg/dL
Ketones, ur: NEGATIVE mg/dL
Nitrite: NEGATIVE
PH: 6.5 (ref 5.0–8.0)
Protein, ur: 30 mg/dL — AB
SPECIFIC GRAVITY, URINE: 1.01 (ref 1.005–1.030)
UROBILINOGEN UA: 0.2 mg/dL (ref 0.0–1.0)

## 2014-05-01 LAB — PREGNANCY, URINE: Preg Test, Ur: NEGATIVE

## 2014-05-01 MED ORDER — ONDANSETRON HCL 4 MG/2ML IJ SOLN
4.0000 mg | Freq: Once | INTRAMUSCULAR | Status: AC
  Administered 2014-05-01: 4 mg via INTRAVENOUS
  Filled 2014-05-01: qty 2

## 2014-05-01 MED ORDER — SODIUM CHLORIDE 0.9 % IV BOLUS (SEPSIS)
1000.0000 mL | INTRAVENOUS | Status: AC
  Administered 2014-05-01: 1000 mL via INTRAVENOUS

## 2014-05-01 MED ORDER — OXYCODONE-ACETAMINOPHEN 5-325 MG PO TABS: 1.0000 | ORAL_TABLET | Freq: Four times a day (QID) | ORAL | Status: DC | PRN

## 2014-05-01 MED ORDER — HYDROMORPHONE HCL PF 1 MG/ML IJ SOLN
1.0000 mg | Freq: Once | INTRAMUSCULAR | Status: AC
  Administered 2014-05-01: 1 mg via INTRAVENOUS
  Filled 2014-05-01: qty 1

## 2014-05-01 MED ORDER — HYDROMORPHONE HCL PF 1 MG/ML IJ SOLN
1.0000 mg | INTRAMUSCULAR | Status: AC
  Administered 2014-05-01: 1 mg via INTRAVENOUS
  Filled 2014-05-01: qty 1

## 2014-05-01 MED ORDER — CEPHALEXIN 500 MG PO CAPS: 500.0000 mg | ORAL_CAPSULE | Freq: Four times a day (QID) | ORAL | Status: DC

## 2014-05-01 NOTE — Discharge Instructions (Signed)
Abdominal Pain, Women °Abdominal (stomach, pelvic, or belly) pain can be caused by many things. It is important to tell your doctor: °· The location of the pain. °· Does it come and go or is it present all the time? °· Are there things that start the pain (eating certain foods, exercise)? °· Are there other symptoms associated with the pain (fever, nausea, vomiting, diarrhea)? °All of this is helpful to know when trying to find the cause of the pain. °CAUSES  °· Stomach: virus or bacteria infection, or ulcer. °· Intestine: appendicitis (inflamed appendix), regional ileitis (Crohn's disease), ulcerative colitis (inflamed colon), irritable bowel syndrome, diverticulitis (inflamed diverticulum of the colon), or cancer of the stomach or intestine. °· Gallbladder disease or stones in the gallbladder. °· Kidney disease, kidney stones, or infection. °· Pancreas infection or cancer. °· Fibromyalgia (pain disorder). °· Diseases of the female organs: °¨ Uterus: fibroid (non-cancerous) tumors or infection. °¨ Fallopian tubes: infection or tubal pregnancy. °¨ Ovary: cysts or tumors. °¨ Pelvic adhesions (scar tissue). °¨ Endometriosis (uterus lining tissue growing in the pelvis and on the pelvic organs). °¨ Pelvic congestion syndrome (female organs filling up with blood just before the menstrual period). °¨ Pain with the menstrual period. °¨ Pain with ovulation (producing an egg). °¨ Pain with an IUD (intrauterine device, birth control) in the uterus. °¨ Cancer of the female organs. °· Functional pain (pain not caused by a disease, may improve without treatment). °· Psychological pain. °· Depression. °DIAGNOSIS  °Your doctor will decide the seriousness of your pain by doing an examination. °· Blood tests. °· X-rays. °· Ultrasound. °· CT scan (computed tomography, special type of X-ray). °· MRI (magnetic resonance imaging). °· Cultures, for infection. °· Barium enema (dye inserted in the large intestine, to better view it with  X-rays). °· Colonoscopy (looking in intestine with a lighted tube). °· Laparoscopy (minor surgery, looking in abdomen with a lighted tube). °· Major abdominal exploratory surgery (looking in abdomen with a large incision). °TREATMENT  °The treatment will depend on the cause of the pain.  °· Many cases can be observed and treated at home. °· Over-the-counter medicines recommended by your caregiver. °· Prescription medicine. °· Antibiotics, for infection. °· Birth control pills, for painful periods or for ovulation pain. °· Hormone treatment, for endometriosis. °· Nerve blocking injections. °· Physical therapy. °· Antidepressants. °· Counseling with a psychologist or psychiatrist. °· Minor or major surgery. °HOME CARE INSTRUCTIONS  °· Do not take laxatives, unless directed by your caregiver. °· Take over-the-counter pain medicine only if ordered by your caregiver. Do not take aspirin because it can cause an upset stomach or bleeding. °· Try a clear liquid diet (broth or water) as ordered by your caregiver. Slowly move to a bland diet, as tolerated, if the pain is related to the stomach or intestine. °· Have a thermometer and take your temperature several times a day, and record it. °· Bed rest and sleep, if it helps the pain. °· Avoid sexual intercourse, if it causes pain. °· Avoid stressful situations. °· Keep your follow-up appointments and tests, as your caregiver orders. °· If the pain does not go away with medicine or surgery, you may try: °¨ Acupuncture. °¨ Relaxation exercises (yoga, meditation). °¨ Group therapy. °¨ Counseling. °SEEK MEDICAL CARE IF:  °· You notice certain foods cause stomach pain. °· Your home care treatment is not helping your pain. °· You need stronger pain medicine. °· You want your IUD removed. °· You feel faint or   lightheaded. °· You develop nausea and vomiting. °· You develop a rash. °· You are having side effects or an allergy to your medicine. °SEEK IMMEDIATE MEDICAL CARE IF:  °· Your  pain does not go away or gets worse. °· You have a fever. °· Your pain is felt only in portions of the abdomen. The right side could possibly be appendicitis. The left lower portion of the abdomen could be colitis or diverticulitis. °· You are passing blood in your stools (bright red or black tarry stools, with or without vomiting). °· You have blood in your urine. °· You develop chills, with or without a fever. °· You pass out. °MAKE SURE YOU:  °· Understand these instructions. °· Will watch your condition. °· Will get help right away if you are not doing well or get worse. °Document Released: 07/11/2007 Document Revised: 01/28/2014 Document Reviewed: 07/31/2009 °[employer]® Patient Information ©2015 [employer]. This information is not intended to replace advice given to you by your health care provider. Make sure you discuss any questions you have with your health care provider. ° °

## 2014-05-01 NOTE — ED Provider Notes (Addendum)
CSN: [id number]     Arrival date & time 05/01/14  1718 History   This chart was scribed for Chelsea SheffieldForrest Shenoa Hattabaugh, MD by Modena JanskyAlbert Thayil, ED Scribe. This patient was seen in room APA03/[facility]03 and the patient's care was started at 5:36 PM.   Chief Complaint  Patient presents with  . Flank Pain   Patient is a 23 y.o. female presenting with flank pain. The history is provided by the patient. No language interpreter was used.  Flank Pain This is a recurrent problem. The current episode started 2 days ago. The problem occurs constantly. The problem has not changed since onset.Pertinent negatives include no chest pain, no abdominal pain, no headaches and no shortness of breath. She has tried nothing for the symptoms.   HPI Comments: Chelsea [name] is a 23 y.o. female with a hx of kidney stones who presents to the Emergency Department complaining of constant moderate right flank pain that started 2 days ago. She currently rates the severity of the pain as a 7/10. She describes the pain as a sharp sensation. She states that her pain feels like a kidney stone. She reports having associated nausea and dysuria. She states that she has no known allergies to medication. She denies any fever.   Past Medical History  Diagnosis Date  . Kidney stones   . Rickets   . Constipation    Past Surgical History  Procedure Laterality Date  . Ureteral stents    . Has only 1 functioning  kidney    . Colon surgery    . Appendectomy    . Leg surgery    . Extracorporeal shock wave lithotripsy Right 03/21/2013    Procedure: EXTRACORPOREAL SHOCK WAVE LITHOTRIPSY (ESWL) RIGHT URETERAL CALCULUS;  Surgeon: Ky BarbanMohammad I Javaid, MD;  Location: [facility];  Service: Urology;  Laterality: Right;  . Cystoscopy w/ ureteral stent placement Right 10/15/2013    Procedure: CYSTOSCOPY WITH RETROGRADE PYELOGRAM/URETERAL STENT PLACEMENT;  Surgeon: Ky BarbanMohammad I Javaid, MD;  Location: [facility];  Service: Urology;  Laterality: Right;   Family History   Problem Relation Age of Onset  . Kidney disease Father    History  Substance Use Topics  . Smoking status: Never Smoker   . Smokeless tobacco: Not on file  . Alcohol Use: No   OB History   Grav Para Term Preterm Abortions TAB SAB Ect Mult Living                 Review of Systems  Constitutional: Negative for fever and fatigue.  HENT: Negative for congestion and drooling.   Eyes: Negative for pain.  Respiratory: Negative for cough and shortness of breath.   Cardiovascular: Negative for chest pain.  Gastrointestinal: Positive for nausea. Negative for vomiting, abdominal pain and diarrhea.  Genitourinary: Positive for dysuria and flank pain. Negative for hematuria.  Musculoskeletal: Negative for neck pain.  Skin: Negative for color change.  Neurological: Negative for dizziness and headaches.  Hematological: Negative for adenopathy.  Psychiatric/Behavioral: Negative for behavioral problems.  All other systems reviewed and are negative.     Allergies  Review of patient's allergies indicates no known allergies.  Home Medications   Prior to Admission medications   Medication Sig Start Date End Date Taking? Authorizing Provider  docusate sodium (COLACE) 100 MG capsule Take 100 mg by mouth 2 (two) times daily.   Yes Historical Provider, MD  ferrous sulfate (FERROUSUL) 325 (65 FE) MG tablet Take 325 mg by mouth at bedtime.   Yes Historical  Provider, MD  Multiple Vitamin (MULTIVITAMIN) tablet Take 1 tablet by mouth daily.   Yes Historical Provider, MD  polyethylene glycol (MIRALAX / GLYCOLAX) packet Take 17 g by mouth 2 (two) times daily.   Yes Historical Provider, MD  potassium chloride (K-DUR) 10 MEQ tablet Take 20 mEq by mouth 3 (three) times daily.    Yes Historical Provider, MD  cephALEXin (KEFLEX) 500 MG capsule Take 1 capsule (500 mg total) by mouth 4 (four) times daily. 05/01/14   Chelsea Sheffield, MD  oxyCODONE-acetaminophen (PERCOCET) 5-325 MG per tablet Take 1 tablet by  mouth every 6 (six) hours as needed for moderate pain. 05/01/14   Chelsea Sheffield, MD   BP 118/91  Pulse 99  Temp(Src) 97.5 F (36.4 C) (Oral)  Resp 20  Ht 4\' 10"  (1.473 m)  Wt 240 lb (108.863 kg)  BMI 50.17 kg/m2  SpO2 99%  LMP 04/28/2014 Physical Exam  Nursing note and vitals reviewed. Constitutional: She is oriented to person, place, and time. She appears well-developed and well-nourished. No distress.  HENT:  Head: Normocephalic and atraumatic.  Mouth/Throat: Oropharynx is clear and moist. No oropharyngeal exudate.  Eyes: Pupils are equal, round, and reactive to light.  Neck: Neck supple. No tracheal deviation present.  Cardiovascular: Normal rate, regular rhythm and normal heart sounds.  Exam reveals no gallop and no friction rub.   No murmur heard. Pulmonary/Chest: Effort normal. No respiratory distress. She has no wheezes. She has no rales.  Abdominal: Soft. She exhibits no distension and no mass. There is tenderness. There is no rebound and no guarding.  Mild nonspecific right flank tenderness. No CVA tenderness bilaterally.   Musculoskeletal: Normal range of motion.  Neurological: She is alert and oriented to person, place, and time.  Skin: Skin is warm and dry.  Psychiatric: She has a normal mood and affect. Her behavior is normal.    ED Course  Procedures (including critical care time) DIAGNOSTIC STUDIES: Oxygen Saturation is 99% on RA, normal by my interpretation.    COORDINATION OF CARE: 5:40 PM- Pt advised of plan for treatment which includes medication, radiology, and labs and pt agrees.  Labs Review Labs Reviewed  URINALYSIS, ROUTINE W REFLEX MICROSCOPIC - Abnormal; Notable for the following:    Color, Urine AMBER (*)    APPearance HAZY (*)    Hgb urine dipstick LARGE (*)    Protein, ur 30 (*)    Leukocytes, UA SMALL (*)    All other components within normal limits  CBC WITH DIFFERENTIAL - Abnormal; Notable for the following:    Hemoglobin 10.5 (*)     HCT 35.4 (*)    MCH 24.0 (*)    MCHC 29.7 (*)    RDW 17.5 (*)    All other components within normal limits  COMPREHENSIVE METABOLIC PANEL - Abnormal; Notable for the following:    Potassium 3.3 (*)    Chloride 114 (*)    CO2 13 (*)    Creatinine, Ser 1.41 (*)    Calcium 8.1 (*)    Alkaline Phosphatase 136 (*)    GFR calc non Af Amer 53 (*)    GFR calc Af Amer 61 (*)    All other components within normal limits  URINE MICROSCOPIC-ADD ON - Abnormal; Notable for the following:    Squamous Epithelial / LPF FEW (*)    Bacteria, UA FEW (*)    All other components within normal limits  PREGNANCY, URINE    Imaging Review Ct Abdomen Pelvis  Wo Contrast  05/01/2014   CLINICAL DATA:  Right side pain, kidney stones  EXAM: CT ABDOMEN AND PELVIS WITHOUT CONTRAST  TECHNIQUE: Multidetector CT imaging of the abdomen and pelvis was performed following the standard protocol without IV contrast.  COMPARISON:  04/13/2014  FINDINGS: Sagittal images of the spine are unremarkable. Lung bases are unremarkable. Unenhanced liver, spleen, pancreas and adrenals are unremarkable. Again noted extensive bilateral nephrocalcinosis. Mild left renal atrophy again noted. There is coarse nonobstructive calcified calculus in lower pole of the right kidney measures 2 cm. No hydronephrosis or hydroureter. No calcified ureteral calculi are noted bilaterally. No aortic aneurysm. No small bowel obstruction. No ascites or free air. No adenopathy. Unenhanced uterus and adnexa are unremarkable. No pericecal inflammation. The terminal ileum is unremarkable. Stable wide-mouth ventral hernia supraumbilical region containing omental fat and small bowel loops without evidence of acute complication. A vaginal tampon is noted. The urinary bladder is unremarkable.  No calcified calculi are noted within urinary bladder.  IMPRESSION: 1. Again noted bilateral medullary nephrocalcinosis. Marked left renal atrophy again noted. No hydronephrosis or  hydroureter. 2. No calcified ureteral calculi. 3. No pericecal inflammation. 4. Stable supraumbilical ventral hernia containing omental fat and small bowel loops without evidence of acute complication.   Electronically Signed   By: Natasha Mead M.D.   On: 05/01/2014 19:11     EKG Interpretation None      MDM   Final diagnoses:  Right flank pain  UTI (lower urinary tract infection)    6:31 PM 23 y.o. female w hx of kidney stones, severe left renal atrophy who presents with right flank pain consistent with previous kidney stones. She has had some emesis at home. She denies any fevers. She is afebrile and vital signs are unremarkable here. She recently had a CT scan but given her history of severe left renal atrophy I think it is important to ensure she is not obstructed on the right side. Will get screening labs, pain control, IV fluids, nausea control, and CT of abdomen.  7:58 PM: CT non-contrib. Pt's Cr proximal to baseline. CO2 is slightly lower than it has been in the past, but does appear to run low on multiple previous visits. She is s/p 1L IVF here. Possibly RTA given mildly low K as well. Given small leuk and bact w/ dysuria, will tx for UTI. I have discussed the diagnosis/risks/treatment options with the patient and family and believe the pt to be eligible for discharge home to follow-up with her pcp in 1-2 days. We also discussed returning to the ED immediately if new or worsening sx occur. We discussed the sx which are most concerning (e.g., worsening pain, inc vomiting, fever) that necessitate immediate return. Medications administered to the patient during their visit and any new prescriptions provided to the patient are listed below.  Medications given during this visit Medications  sodium chloride 0.9 % bolus 1,000 mL (0 mLs Intravenous Stopped 05/01/14 1924)  ondansetron (ZOFRAN) injection 4 mg (4 mg Intravenous Given 05/01/14 1801)  HYDROmorphone (DILAUDID) injection 1 mg (1 mg  Intravenous Given 05/01/14 1801)  HYDROmorphone (DILAUDID) injection 1 mg (1 mg Intravenous Given 05/01/14 1928)    New Prescriptions   CEPHALEXIN (KEFLEX) 500 MG CAPSULE    Take 1 capsule (500 mg total) by mouth 4 (four) times daily.   OXYCODONE-ACETAMINOPHEN (PERCOCET) 5-325 MG PER TABLET    Take 1 tablet by mouth every 6 (six) hours as needed for moderate pain.  I personally performed the services described in this documentation, which was scribed in my presence. The recorded information has been reviewed and is accurate.     Chelsea Sheffield, MD 05/02/14 1125  Chelsea Sheffield, MD 05/02/14 1128

## 2014-05-01 NOTE — ED Notes (Signed)
Pt states right flank pain x 2 days. States she vomited x 2 today. NAD.

## 2014-05-28 ENCOUNTER — Encounter (HOSPITAL_COMMUNITY): Payer: Self-pay | Admitting: Emergency Medicine

## 2014-05-28 ENCOUNTER — Emergency Department (HOSPITAL_COMMUNITY)
Admission: EM | Admit: 2014-05-28 | Discharge: 2014-05-28 | Disposition: A | Discharge status: Home / Self Care | Payer: Medicaid - Out of State | Attending: Emergency Medicine | Admitting: Emergency Medicine

## 2014-05-28 DIAGNOSIS — Z79899 Other long term (current) drug therapy: Secondary | ICD-10-CM | POA: Insufficient documentation

## 2014-05-28 DIAGNOSIS — Z3202 Encounter for pregnancy test, result negative: Secondary | ICD-10-CM | POA: Insufficient documentation

## 2014-05-28 DIAGNOSIS — K5900 Constipation, unspecified: Secondary | ICD-10-CM | POA: Insufficient documentation

## 2014-05-28 DIAGNOSIS — Z862 Personal history of diseases of the blood and blood-forming organs and certain disorders involving the immune mechanism: Secondary | ICD-10-CM | POA: Diagnosis not present

## 2014-05-28 DIAGNOSIS — R1084 Generalized abdominal pain: Secondary | ICD-10-CM | POA: Diagnosis present

## 2014-05-28 DIAGNOSIS — M2550 Pain in unspecified joint: Secondary | ICD-10-CM

## 2014-05-28 DIAGNOSIS — Z87442 Personal history of urinary calculi: Secondary | ICD-10-CM | POA: Insufficient documentation

## 2014-05-28 DIAGNOSIS — M2559 Pain in other specified joint: Secondary | ICD-10-CM | POA: Insufficient documentation

## 2014-05-28 DIAGNOSIS — R109 Unspecified abdominal pain: Secondary | ICD-10-CM

## 2014-05-28 DIAGNOSIS — Z8639 Personal history of other endocrine, nutritional and metabolic disease: Secondary | ICD-10-CM | POA: Insufficient documentation

## 2014-05-28 LAB — CBC WITH DIFFERENTIAL/PLATELET
BASOS ABS: 0 10*3/uL (ref 0.0–0.1)
BASOS PCT: 0 % (ref 0–1)
EOS ABS: 0.1 10*3/uL (ref 0.0–0.7)
Eosinophils Relative: 1 % (ref 0–5)
HEMATOCRIT: 34.6 % — AB (ref 36.0–46.0)
Hemoglobin: 10 g/dL — ABNORMAL LOW (ref 12.0–15.0)
Lymphocytes Relative: 20 % (ref 12–46)
Lymphs Abs: 1.6 10*3/uL (ref 0.7–4.0)
MCH: 23.5 pg — ABNORMAL LOW (ref 26.0–34.0)
MCHC: 28.9 g/dL — AB (ref 30.0–36.0)
MCV: 81.2 fL (ref 78.0–100.0)
MONO ABS: 0.6 10*3/uL (ref 0.1–1.0)
Monocytes Relative: 7 % (ref 3–12)
Neutro Abs: 5.7 10*3/uL (ref 1.7–7.7)
Neutrophils Relative %: 72 % (ref 43–77)
Platelets: 251 10*3/uL (ref 150–400)
RBC: 4.26 MIL/uL (ref 3.87–5.11)
RDW: 17.7 % — ABNORMAL HIGH (ref 11.5–15.5)
WBC: 7.9 10*3/uL (ref 4.0–10.5)

## 2014-05-28 LAB — URINALYSIS, ROUTINE W REFLEX MICROSCOPIC
Bilirubin Urine: NEGATIVE
Glucose, UA: NEGATIVE mg/dL
HGB URINE DIPSTICK: NEGATIVE
Ketones, ur: NEGATIVE mg/dL
Nitrite: NEGATIVE
PROTEIN: NEGATIVE mg/dL
Specific Gravity, Urine: 1.01 (ref 1.005–1.030)
UROBILINOGEN UA: 0.2 mg/dL (ref 0.0–1.0)
pH: 6.5 (ref 5.0–8.0)

## 2014-05-28 LAB — URINE MICROSCOPIC-ADD ON

## 2014-05-28 LAB — BASIC METABOLIC PANEL
ANION GAP: 9 (ref 5–15)
BUN: 10 mg/dL (ref 6–23)
CO2: 16 mEq/L — ABNORMAL LOW (ref 19–32)
Calcium: 8.7 mg/dL (ref 8.4–10.5)
Chloride: 114 mEq/L — ABNORMAL HIGH (ref 96–112)
Creatinine, Ser: 1.55 mg/dL — ABNORMAL HIGH (ref 0.50–1.10)
GFR calc non Af Amer: 47 mL/min — ABNORMAL LOW (ref >90)
GFR, EST AFRICAN AMERICAN: 55 mL/min — AB (ref >90)
Glucose, Bld: 92 mg/dL (ref 70–99)
Potassium: 4.1 mEq/L (ref 3.7–5.3)
Sodium: 139 mEq/L (ref 137–147)

## 2014-05-28 LAB — PREGNANCY, URINE: Preg Test, Ur: NEGATIVE

## 2014-05-28 MED ORDER — ACETAMINOPHEN 500 MG PO TABS
1000.0000 mg | ORAL_TABLET | Freq: Once | ORAL | Status: AC
  Administered 2014-05-28: 1000 mg via ORAL
  Filled 2014-05-28: qty 2

## 2014-05-28 MED ORDER — METHOCARBAMOL 500 MG PO TABS: 500.0000 mg | ORAL_TABLET | Freq: Three times a day (TID) | ORAL | Status: DC

## 2014-05-28 MED ORDER — MAGNESIUM HYDROXIDE 400 MG/5ML PO SUSP
30.0000 mL | Freq: Once | ORAL | Status: AC
  Administered 2014-05-28: 30 mL via ORAL
  Filled 2014-05-28: qty 30

## 2014-05-28 MED ORDER — DIAZEPAM 5 MG PO TABS
5.0000 mg | ORAL_TABLET | Freq: Once | ORAL | Status: AC
  Administered 2014-05-28: 5 mg via ORAL
  Filled 2014-05-28: qty 1

## 2014-05-28 NOTE — ED Provider Notes (Signed)
Medical screening examination/treatment/procedure(s) were performed by non-physician practitioner and as supervising physician I was immediately available for consultation/collaboration.   EKG Interpretation None        Sama Arauz L Orest Dygert, MD 05/28/14 1455 

## 2014-05-28 NOTE — ED Provider Notes (Signed)
CSN: [id number]     Arrival date & time 05/28/14  0808 History   First MD Initiated Contact with Patient 05/28/14 [phone]     Chief Complaint  Patient presents with  . Abdominal Pain  . Flank Pain     (Consider location/radiation/quality/duration/timing/severity/associated sxs/prior Treatment) HPI Comments: The patient is a 23 year old female resident of the [place] area who presents to the emergency department with a complaint of flank pain on the right, and abdominal pain. The patient states that the flank pain has been going on for 2 or 3 days. She describes it as a sharp pain that sometimes last for just a few minutes, and other times last 4 an hour. She states that the pain is aggravated with certain movements. She is also concerned that this pain may be related to a kidney stone, as she has had problems with kidney stones in the past. She is also concerned because she only has one functioning kidney.  Patient complains of diffuse abdomen pain, that is off and on for the past week. The patient states that she has a hernia and she is concerned as to whether or not the hernia may be causing the pain. The patient also describes constipation, stating that she has not had a movement for nearly a week, and that came after an enema. There's been no high fever reported. She has not been passing any blood in her stools or in her urine. She's had nausea, but no vomitus. His been no injury to the abdomen or back area.  Patient is a 23 y.o. female presenting with flank pain. The history is provided by the patient.  Flank Pain This is a recurrent problem. The current episode started in the past 7 days. Associated symptoms include abdominal pain and arthralgias. Pertinent negatives include no chest pain, coughing or neck pain.    Past Medical History  Diagnosis Date  . Kidney stones   . Rickets   . Constipation    Past Surgical History  Procedure Laterality Date  . Ureteral stents    . Has  only 1 functioning  kidney    . Colon surgery    . Appendectomy    . Leg surgery    . Extracorporeal shock wave lithotripsy Right 03/21/2013    Procedure: EXTRACORPOREAL SHOCK WAVE LITHOTRIPSY (ESWL) RIGHT URETERAL CALCULUS;  Surgeon: Ky Barban, MD;  Location: [facility];  Service: Urology;  Laterality: Right;  . Cystoscopy w/ ureteral stent placement Right 10/15/2013    Procedure: CYSTOSCOPY WITH RETROGRADE PYELOGRAM/URETERAL STENT PLACEMENT;  Surgeon: Ky Barban, MD;  Location: [facility];  Service: Urology;  Laterality: Right;   Family History  Problem Relation Age of Onset  . Kidney disease Father    History  Substance Use Topics  . Smoking status: Never Smoker   . Smokeless tobacco: Not on file  . Alcohol Use: No   OB History   Grav Para Term Preterm Abortions TAB SAB Ect Mult Living                 Review of Systems  Constitutional: Negative for activity change.       All ROS Neg except as noted in HPI  HENT: Negative.   Eyes: Negative for photophobia and discharge.  Respiratory: Negative for cough, shortness of breath and wheezing.   Cardiovascular: Negative for chest pain and palpitations.  Gastrointestinal: Positive for abdominal pain and constipation. Negative for blood in stool.  Genitourinary: Positive for flank pain. Negative  for dysuria, frequency and hematuria.  Musculoskeletal: Positive for arthralgias. Negative for back pain and neck pain.  Skin: Negative.   Neurological: Negative for dizziness, seizures and speech difficulty.  Psychiatric/Behavioral: Negative for hallucinations and confusion.      Allergies  Review of patient's allergies indicates no known allergies.  Home Medications   Prior to Admission medications   Medication Sig Start Date End Date Taking? Authorizing Provider  docusate sodium (COLACE) 100 MG capsule Take 100 mg by mouth 2 (two) times daily.   Yes Historical Provider, MD  ferrous sulfate (FERROUSUL) 325 (65 FE) MG tablet  Take 325 mg by mouth at bedtime.   Yes Historical Provider, MD  Multiple Vitamin (MULTIVITAMIN) tablet Take 1 tablet by mouth daily.   Yes Historical Provider, MD  pantoprazole (PROTONIX) 40 MG tablet Take 40 mg by mouth daily.   Yes Historical Provider, MD  polyethylene glycol (MIRALAX / GLYCOLAX) packet Take 17 g by mouth 2 (two) times daily.   Yes Historical Provider, MD  potassium citrate (UROCIT-K) 10 MEQ (1080 MG) SR tablet Take 20 mEq by mouth 3 (three) times daily with meals.   Yes Historical Provider, MD   BP 106/68  Pulse 89  Temp(Src) 98.6 F (37 C) (Oral)  Resp 18  Ht  (1.473 m)  Wt 250 lb (113.399 kg)  BMI 52.26 kg/m2  SpO2 97%  LMP 04/28/2014 Physical Exam  Nursing note and vitals reviewed. Constitutional: She is oriented to person, place, and time. She appears well-developed and well-nourished.  Non-toxic appearance.  HENT:  Head: Normocephalic.  Right Ear: Tympanic membrane and external ear normal.  Left Ear: Tympanic membrane and external ear normal.  Eyes: EOM and lids are normal. Pupils are equal, round, and reactive to light.  Neck: Normal range of motion. Neck supple. Carotid bruit is not present.  Cardiovascular: Normal rate, regular rhythm, normal heart sounds, intact distal pulses and normal pulses.   Pulmonary/Chest: Breath sounds normal. No respiratory distress.  Abdominal: Soft. Bowel sounds are normal. There is tenderness. There is no rebound and no guarding.  Pain to palpation of the right mid and lower abdomen. Pain in the suprapubic area. The abdomen is soft with good bowel sounds. There is no mass appreciated. No organomegaly appreciated. There is no rebound appreciated. There is no guarding noted. No pulsatile mass appreciated. No CVA tenderness appreciated.  Musculoskeletal: Normal range of motion.  Lymphadenopathy:       Head (right side): No submandibular adenopathy present.       Head (left side): No submandibular adenopathy present.    She  has no cervical adenopathy.  Neurological: She is alert and oriented to person, place, and time. She has normal strength. No cranial nerve deficit or sensory deficit.  Skin: Skin is warm and dry.  Psychiatric: She has a normal mood and affect. Her speech is normal.    ED Course  Procedures (including critical care time) Labs Review Labs Reviewed  URINALYSIS, ROUTINE W REFLEX MICROSCOPIC - Abnormal; Notable for the following:    Leukocytes, UA SMALL (*)    All other components within normal limits  CBC WITH DIFFERENTIAL - Abnormal; Notable for the following:    Hemoglobin 10.0 (*)    HCT 34.6 (*)    MCH 23.5 (*)    MCHC 28.9 (*)    RDW 17.7 (*)    All other components within normal limits  BASIC METABOLIC PANEL - Abnormal; Notable for the following:    Chloride 114 (*)  CO2 16 (*)    Creatinine, Ser 1.55 (*)    GFR calc non Af Amer 47 (*)    GFR calc Af Amer 55 (*)    All other components within normal limits  URINE MICROSCOPIC-ADD ON - Abnormal; Notable for the following:    Squamous Epithelial / LPF FEW (*)    Bacteria, UA MANY (*)    All other components within normal limits  PREGNANCY, URINE    Imaging Review No results found.   EKG Interpretation None      M  Vital signs are within normal limits. Complete blood count shows a hemoglobin of 10, hematocrit of 34.6, both of which are low, but within the patient's usual range of test ear in the emergency department. Basic metabolic panel on shows the chloride to be slightly elevated at 114, CO2 low at 16, the creatinine elevated at 1.55, these too her in the usual range for this patient. Anion gap is 9. Urine analysis is nonacute. Pregnancy test is negative  I have reviewed the previous emergency department visits, as well as the recent CT scan of August 5.  I discussed with the patient that there is no signs of major infection at this time. The lab work does not support urinary obstruction or severe renal calculi  problems.  Plan at this time will be for the patient to continue to strain her urine for possible passing of any stones. I've encouraged patient to add another stool softener or laxative to her MiraLax. We'll try a muscle relaxant just in case the pain may be musculoskeletal in nature. Patient not treated with narcotics at this time.    Final diagnoses:  None    **I have reviewed nursing notes, vital signs, and all appropriate lab and imaging results for this patient.Kathie Dike, PA-C 05/28/14 1039

## 2014-05-28 NOTE — Discharge Instructions (Signed)
Please increase water and juices and fiber to your diet. Please add another laxative or stool softener to the MiraLax that you're currently taking. Please use Tylenol every 4 hours and Robaxin 3 times daily for your soreness and discomfort. Please continue to strain all urine. Please see your primary physician in [place] for additional evaluation and management of the flank pain.  Constipation Constipation is when a person:  Poops (has a bowel movement) less than 3 times a week.  Has a hard time pooping.  Has poop that is dry, hard, or bigger than normal. HOME CARE   Eat foods with a lot of fiber in them. This includes fruits, vegetables, beans, and whole grains such as brown rice.  Avoid fatty foods and foods with a lot of sugar. This includes french fries, hamburgers, cookies, candy, and soda.  If you are not getting enough fiber from food, take products with added fiber in them (supplements).  Drink enough fluid to keep your pee (urine) clear or pale yellow.  Exercise on a regular basis, or as told by your doctor.  Go to the restroom when you feel like you need to poop. Do not hold it.  Only take medicine as told by your doctor. Do not take medicines that help you poop (laxatives) without talking to your doctor first. GET HELP RIGHT AWAY IF:   You have bright red blood in your poop (stool).  Your constipation lasts more than 4 days or gets worse.  You have belly (abdominal) or butt (rectal) pain.  You have thin poop (as thin as a pencil).  You lose weight, and it cannot be explained. MAKE SURE YOU:   Understand these instructions.  Will watch your condition.  Will get help right away if you are not doing well or get worse. Document Released: 03/01/2008 Document Revised: 09/18/2013 Document Reviewed: 06/25/2013 [facility] Patient Information 2015 [place], [place]. This information is not intended to replace advice given to you by your health care provider. Make sure you  discuss any questions you have with your health care provider.  Flank Pain Flank pain is pain in your side. The flank is the area of your side between your upper belly (abdomen) and your back. Pain in this area can be caused by many different things. HOME CARE Home care and treatment will depend on the cause of your pain.  Rest as told by your doctor.  Drink enough fluids to keep your pee (urine) clear or pale yellow.  Only take medicine as told by your doctor.  Tell your doctor about any changes in your pain.  Follow up with your doctor. GET HELP RIGHT AWAY IF:   Your pain does not get better with medicine.   You have new symptoms or your symptoms get worse.  Your pain gets worse.   You have belly (abdominal) pain.   You are short of breath.   You always feel sick to your stomach (nauseous).   You keep throwing up (vomiting).   You have puffiness (swelling) in your belly.   You feel light-headed or you pass out (faint).   You have blood in your pee.  You have a fever or lasting symptoms for more than 2-3 days.  You have a fever and your symptoms suddenly get worse. MAKE SURE YOU:   Understand these instructions.  Will watch your condition.  Will get help right away if you are not doing well or get worse. Document Released: 06/22/2008 Document Revised: 01/28/2014 Document Reviewed:  04/27/2012 [employer] Patient Information 2015 [place], [place]. This information is not intended to replace advice given to you by your health care provider. Make sure you discuss any questions you have with your health care provider.

## 2014-05-28 NOTE — ED Notes (Signed)
Pt c/o upper abd pain off and on x 1 week.  Reports 2 days ago started having r flank pain as well.  Reports n/v.  Pt says has history of kidney stones and states that only her r kidney is functioning.

## 2014-06-10 ENCOUNTER — Encounter (HOSPITAL_COMMUNITY): Payer: Self-pay | Admitting: Emergency Medicine

## 2014-06-10 ENCOUNTER — Emergency Department (HOSPITAL_COMMUNITY)
Admission: EM | Admit: 2014-06-10 | Discharge: 2014-06-10 | Disposition: A | Discharge status: Home / Self Care | Payer: Medicaid - Out of State | Attending: Emergency Medicine | Admitting: Emergency Medicine

## 2014-06-10 DIAGNOSIS — Z3202 Encounter for pregnancy test, result negative: Secondary | ICD-10-CM | POA: Diagnosis not present

## 2014-06-10 DIAGNOSIS — K5900 Constipation, unspecified: Secondary | ICD-10-CM | POA: Diagnosis not present

## 2014-06-10 DIAGNOSIS — R109 Unspecified abdominal pain: Secondary | ICD-10-CM | POA: Insufficient documentation

## 2014-06-10 DIAGNOSIS — Z79899 Other long term (current) drug therapy: Secondary | ICD-10-CM | POA: Diagnosis not present

## 2014-06-10 DIAGNOSIS — Z9889 Other specified postprocedural states: Secondary | ICD-10-CM | POA: Diagnosis not present

## 2014-06-10 DIAGNOSIS — Z87442 Personal history of urinary calculi: Secondary | ICD-10-CM | POA: Insufficient documentation

## 2014-06-10 LAB — PREGNANCY, URINE: Preg Test, Ur: NEGATIVE

## 2014-06-10 LAB — URINE MICROSCOPIC-ADD ON

## 2014-06-10 LAB — URINALYSIS, ROUTINE W REFLEX MICROSCOPIC
BILIRUBIN URINE: NEGATIVE
Glucose, UA: NEGATIVE mg/dL
Ketones, ur: NEGATIVE mg/dL
NITRITE: NEGATIVE
PH: 7.5 (ref 5.0–8.0)
Protein, ur: NEGATIVE mg/dL
Specific Gravity, Urine: <1.005 — ABNORMAL LOW (ref 1.005–1.030)
Urobilinogen, UA: 0.2 mg/dL (ref 0.0–1.0)

## 2014-06-10 MED ORDER — ACETAMINOPHEN 500 MG PO TABS: 1000.0000 mg | ORAL_TABLET | Freq: Once | ORAL | Status: DC

## 2014-06-10 MED ORDER — IBUPROFEN 800 MG PO TABS: 800.0000 mg | ORAL_TABLET | Freq: Once | ORAL | Status: DC

## 2014-06-10 NOTE — ED Notes (Addendum)
Pt c/o lower right sided back pain that radiates around to her rlq x 3 days. Pt also reports n/v and dysuria.

## 2014-06-10 NOTE — ED Notes (Signed)
Pt refused medication, states she has these meds at home.

## 2014-06-10 NOTE — Discharge Instructions (Signed)
Pregnancy test.  Urinalysis shows no evidence of infection.   You have had multiple CT scans which show no stone in your urinary tube.   Tylenol or ibuprofen for pain

## 2014-06-10 NOTE — ED Provider Notes (Signed)
CSN: [id number]     Arrival date & time 06/10/14  0450 History   First MD Initiated Contact with Patient 06/10/14 0520     No chief complaint on file.    (Consider location/radiation/quality/duration/timing/severity/associated sxs/prior Treatment) HPI.... right flank pain for 3 days with radiation to right lower quadrant. She she has been evaluated multiple times for similar pain with multiple CT scans in the past.  No fever, chills, dysuria, frequency, hematuria. Severity is mild. Nothing makes symptoms better or worse.  Past Medical History  Diagnosis Date  . Kidney stones   . Rickets   . Constipation    Past Surgical History  Procedure Laterality Date  . Ureteral stents    . Has only 1 functioning  kidney    . Colon surgery    . Appendectomy    . Leg surgery    . Extracorporeal shock wave lithotripsy Right 03/21/2013    Procedure: EXTRACORPOREAL SHOCK WAVE LITHOTRIPSY (ESWL) RIGHT URETERAL CALCULUS;  Surgeon: Ky Barban, MD;  Location: [facility];  Service: Urology;  Laterality: Right;  . Cystoscopy w/ ureteral stent placement Right 10/15/2013    Procedure: CYSTOSCOPY WITH RETROGRADE PYELOGRAM/URETERAL STENT PLACEMENT;  Surgeon: Ky Barban, MD;  Location: [facility];  Service: Urology;  Laterality: Right;   Family History  Problem Relation Age of Onset  . Kidney disease Father    History  Substance Use Topics  . Smoking status: Never Smoker   . Smokeless tobacco: Not on file  . Alcohol Use: No   OB History   Grav Para Term Preterm Abortions TAB SAB Ect Mult Living                 Review of Systems  All other systems reviewed and are negative.     Allergies  Review of patient's allergies indicates no known allergies.  Home Medications   Prior to Admission medications   Medication Sig Start Date End Date Taking? Authorizing Provider  docusate sodium (COLACE) 100 MG capsule Take 100 mg by mouth 2 (two) times daily.   Yes Historical Provider, MD  ferrous  sulfate (FERROUSUL) 325 (65 FE) MG tablet Take 325 mg by mouth at bedtime.   Yes Historical Provider, MD  Multiple Vitamin (MULTIVITAMIN) tablet Take 1 tablet by mouth daily.   Yes Historical Provider, MD  pantoprazole (PROTONIX) 40 MG tablet Take 40 mg by mouth daily.   Yes Historical Provider, MD  polyethylene glycol (MIRALAX / GLYCOLAX) packet Take 17 g by mouth 2 (two) times daily.   Yes Historical Provider, MD  potassium citrate (UROCIT-K) 10 MEQ (1080 MG) SR tablet Take 20 mEq by mouth 3 (three) times daily with meals.   Yes Historical Provider, MD   BP 125/73  Pulse 96  Temp(Src) 98.1 F (36.7 C) (Oral)  Resp 18  Ht  (1.473 m)  Wt 240 lb (108.863 kg)  BMI 50.17 kg/m2  SpO2 100%  LMP 05/19/2014 Physical Exam  Nursing note and vitals reviewed. Constitutional: She is oriented to person, place, and time.  Morbidly obese  HENT:  Head: Normocephalic and atraumatic.  Eyes: Conjunctivae and EOM are normal. Pupils are equal, round, and reactive to light.  Neck: Normal range of motion. Neck supple.  Cardiovascular: Normal rate, regular rhythm and normal heart sounds.   Pulmonary/Chest: Effort normal and breath sounds normal.  Abdominal: Soft. Bowel sounds are normal.  Nontender right lower quadrant  Genitourinary:  Nontender right flank  Musculoskeletal: Normal range of motion.  Neurological: She is alert and oriented to person, place, and time.  Skin: Skin is warm and dry.  Psychiatric: She has a normal mood and affect. Her behavior is normal.    ED Course  Procedures (including critical care time) Labs Review Labs Reviewed  URINALYSIS, ROUTINE W REFLEX MICROSCOPIC - Abnormal; Notable for the following:    Specific Gravity, Urine <1.005 (*)    Hgb urine dipstick TRACE (*)    Leukocytes, UA SMALL (*)    All other components within normal limits  PREGNANCY, URINE  URINE MICROSCOPIC-ADD ON    Imaging Review No results found.   EKG Interpretation None      MDM    Final diagnoses:  Right flank pain    Patient appears in no acute distress. Urinalysis shows a trace of hemoglobin but no other worrisome findings.  Tylenol and/or ibuprofen for pain. She has urology followup in [facility], MD 06/10/14 [phone]

## 2014-08-07 ENCOUNTER — Encounter (HOSPITAL_COMMUNITY): Payer: Self-pay | Admitting: Emergency Medicine

## 2014-08-07 ENCOUNTER — Emergency Department (HOSPITAL_COMMUNITY)
Admission: EM | Admit: 2014-08-07 | Discharge: 2014-08-07 | Discharge status: Against Medical Advice | Payer: Medicaid - Out of State | Attending: Emergency Medicine | Admitting: Emergency Medicine

## 2014-08-07 DIAGNOSIS — R1110 Vomiting, unspecified: Secondary | ICD-10-CM | POA: Diagnosis present

## 2014-08-07 DIAGNOSIS — R109 Unspecified abdominal pain: Secondary | ICD-10-CM | POA: Insufficient documentation

## 2014-08-07 NOTE — ED Notes (Signed)
Pt reports 2 days of emesis and a week of R flank pain. Pt reports hx of nephrolithiasis and kidney disease.

## 2014-08-24 ENCOUNTER — Emergency Department (HOSPITAL_COMMUNITY): Payer: Medicaid - Out of State

## 2014-08-24 ENCOUNTER — Emergency Department (HOSPITAL_COMMUNITY)
Admission: EM | Admit: 2014-08-24 | Discharge: 2014-08-24 | Disposition: A | Discharge status: Home / Self Care | Payer: Medicaid - Out of State | Attending: Emergency Medicine | Admitting: Emergency Medicine

## 2014-08-24 ENCOUNTER — Encounter (HOSPITAL_COMMUNITY): Payer: Self-pay | Admitting: *Deleted

## 2014-08-24 DIAGNOSIS — Z79899 Other long term (current) drug therapy: Secondary | ICD-10-CM | POA: Diagnosis not present

## 2014-08-24 DIAGNOSIS — N2589 Other disorders resulting from impaired renal tubular function: Secondary | ICD-10-CM | POA: Insufficient documentation

## 2014-08-24 DIAGNOSIS — N200 Calculus of kidney: Secondary | ICD-10-CM | POA: Insufficient documentation

## 2014-08-24 DIAGNOSIS — Z8719 Personal history of other diseases of the digestive system: Secondary | ICD-10-CM | POA: Diagnosis not present

## 2014-08-24 DIAGNOSIS — N289 Disorder of kidney and ureter, unspecified: Secondary | ICD-10-CM | POA: Diagnosis not present

## 2014-08-24 DIAGNOSIS — Z3202 Encounter for pregnancy test, result negative: Secondary | ICD-10-CM | POA: Insufficient documentation

## 2014-08-24 DIAGNOSIS — R109 Unspecified abdominal pain: Secondary | ICD-10-CM | POA: Diagnosis present

## 2014-08-24 DIAGNOSIS — Z8639 Personal history of other endocrine, nutritional and metabolic disease: Secondary | ICD-10-CM | POA: Insufficient documentation

## 2014-08-24 LAB — URINALYSIS, ROUTINE W REFLEX MICROSCOPIC
Bilirubin Urine: NEGATIVE
Glucose, UA: NEGATIVE mg/dL
Ketones, ur: NEGATIVE mg/dL
Nitrite: NEGATIVE
PROTEIN: 30 mg/dL — AB
Specific Gravity, Urine: 1.015 (ref 1.005–1.030)
Urobilinogen, UA: 0.2 mg/dL (ref 0.0–1.0)
pH: 7.5 (ref 5.0–8.0)

## 2014-08-24 LAB — CBC WITH DIFFERENTIAL/PLATELET
Basophils Absolute: 0 10*3/uL (ref 0.0–0.1)
Basophils Relative: 0 % (ref 0–1)
Eosinophils Absolute: 0.1 10*3/uL (ref 0.0–0.7)
Eosinophils Relative: 1 % (ref 0–5)
HCT: 36.7 % (ref 36.0–46.0)
HEMOGLOBIN: 11.2 g/dL — AB (ref 12.0–15.0)
LYMPHS ABS: 1.4 10*3/uL (ref 0.7–4.0)
LYMPHS PCT: 14 % (ref 12–46)
MCH: 24.6 pg — ABNORMAL LOW (ref 26.0–34.0)
MCHC: 30.5 g/dL (ref 30.0–36.0)
MCV: 80.7 fL (ref 78.0–100.0)
MONO ABS: 0.5 10*3/uL (ref 0.1–1.0)
MONOS PCT: 5 % (ref 3–12)
NEUTROS ABS: 8.2 10*3/uL — AB (ref 1.7–7.7)
NEUTROS PCT: 80 % — AB (ref 43–77)
Platelets: 256 10*3/uL (ref 150–400)
RBC: 4.55 MIL/uL (ref 3.87–5.11)
RDW: 18.3 % — ABNORMAL HIGH (ref 11.5–15.5)
WBC: 10.2 10*3/uL (ref 4.0–10.5)

## 2014-08-24 LAB — URINE MICROSCOPIC-ADD ON

## 2014-08-24 LAB — COMPREHENSIVE METABOLIC PANEL
ALBUMIN: 4.1 g/dL (ref 3.5–5.2)
ALT: 13 U/L (ref 0–35)
AST: 12 U/L (ref 0–37)
Alkaline Phosphatase: 151 U/L — ABNORMAL HIGH (ref 39–117)
Anion gap: 16 — ABNORMAL HIGH (ref 5–15)
BILIRUBIN TOTAL: 0.4 mg/dL (ref 0.3–1.2)
BUN: 11 mg/dL (ref 6–23)
CHLORIDE: 106 meq/L (ref 96–112)
CO2: 16 mEq/L — ABNORMAL LOW (ref 19–32)
CREATININE: 1.29 mg/dL — AB (ref 0.50–1.10)
Calcium: 8.8 mg/dL (ref 8.4–10.5)
GFR calc Af Amer: 68 mL/min — ABNORMAL LOW (ref >90)
GFR calc non Af Amer: 58 mL/min — ABNORMAL LOW (ref >90)
Glucose, Bld: 91 mg/dL (ref 70–99)
Potassium: 3.9 mEq/L (ref 3.7–5.3)
Sodium: 138 mEq/L (ref 137–147)
TOTAL PROTEIN: 8.4 g/dL — AB (ref 6.0–8.3)

## 2014-08-24 LAB — PREGNANCY, URINE: Preg Test, Ur: NEGATIVE

## 2014-08-24 LAB — LIPASE, BLOOD: LIPASE: 40 U/L (ref 11–59)

## 2014-08-24 MED ORDER — SODIUM CHLORIDE 0.9 % IV SOLN
1000.0000 mL | Freq: Once | INTRAVENOUS | Status: AC
  Administered 2014-08-24: 1000 mL via INTRAVENOUS

## 2014-08-24 MED ORDER — HYDROMORPHONE HCL 1 MG/ML IJ SOLN
1.0000 mg | Freq: Once | INTRAMUSCULAR | Status: AC
  Administered 2014-08-24: 1 mg via INTRAVENOUS
  Filled 2014-08-24: qty 1

## 2014-08-24 MED ORDER — ONDANSETRON HCL 4 MG/2ML IJ SOLN
4.0000 mg | Freq: Once | INTRAMUSCULAR | Status: AC
  Administered 2014-08-24: 4 mg via INTRAVENOUS
  Filled 2014-08-24: qty 2

## 2014-08-24 MED ORDER — PROMETHAZINE HCL 25 MG PO TABS: 25.0000 mg | ORAL_TABLET | Freq: Four times a day (QID) | ORAL | Status: DC | PRN

## 2014-08-24 MED ORDER — OXYCODONE-ACETAMINOPHEN 5-325 MG PO TABS: 1.0000 | ORAL_TABLET | ORAL | Status: DC | PRN

## 2014-08-24 MED ORDER — SODIUM CHLORIDE 0.9 % IV SOLN: 1000.0000 mL | INTRAVENOUS | Status: DC

## 2014-08-24 MED ORDER — PROMETHAZINE HCL 25 MG/ML IJ SOLN
25.0000 mg | Freq: Once | INTRAMUSCULAR | Status: AC
  Administered 2014-08-24: 25 mg via INTRAVENOUS
  Filled 2014-08-24: qty 1

## 2014-08-24 NOTE — ED Provider Notes (Signed)
CSN: [id number]     Arrival date & time 08/24/14  0034 History   First MD Initiated Contact with Patient 08/24/14 0052     Chief Complaint  Patient presents with  . Flank Pain     (Consider location/radiation/quality/duration/timing/severity/associated sxs/prior Treatment) Patient is a 23 y.o. female presenting with flank pain. The history is provided by the patient.  Flank Pain  She has a history of right-sided kidney stones comes in with 2 day history of pain in the right flank and right side of her abdomen with associated nausea, vomiting, diarrhea. Pain is severe and she rates it at 8/10. Nothing makes it better nothing makes it worse. She's been taking acetaminophen with no relief. There has been no fever or chills or sweats. She states that she had recently been admitted at the hospital in [place] where she was found to be in kidney failure and had a stent placed in the right side and kidney function did improve following that.  Past Medical History  Diagnosis Date  . Kidney stones   . Rickets   . Constipation    Past Surgical History  Procedure Laterality Date  . Ureteral stents    . Has only 1 functioning  kidney    . Colon surgery    . Appendectomy    . Leg surgery    . Extracorporeal shock wave lithotripsy Right 03/21/2013    Procedure: EXTRACORPOREAL SHOCK WAVE LITHOTRIPSY (ESWL) RIGHT URETERAL CALCULUS;  Surgeon: Ky Barban, MD;  Location: [facility];  Service: Urology;  Laterality: Right;  . Cystoscopy w/ ureteral stent placement Right 10/15/2013    Procedure: CYSTOSCOPY WITH RETROGRADE PYELOGRAM/URETERAL STENT PLACEMENT;  Surgeon: Ky Barban, MD;  Location: [facility];  Service: Urology;  Laterality: Right;   Family History  Problem Relation Age of Onset  . Kidney disease Father    History  Substance Use Topics  . Smoking status: Never Smoker   . Smokeless tobacco: Not on file  . Alcohol Use: No   OB History    Gravida Para Term Preterm AB TAB SAB  Ectopic Multiple Living            0     Review of Systems  Genitourinary: Positive for flank pain.  All other systems reviewed and are negative.     Allergies  Review of patient's allergies indicates no known allergies.  Home Medications   Prior to Admission medications   Medication Sig Start Date End Date Taking? Authorizing Provider  docusate sodium (COLACE) 100 MG capsule Take 100 mg by mouth 2 (two) times daily.    Historical Provider, MD  ferrous sulfate (FERROUSUL) 325 (65 FE) MG tablet Take 325 mg by mouth at bedtime.    Historical Provider, MD  Multiple Vitamin (MULTIVITAMIN) tablet Take 1 tablet by mouth daily.    Historical Provider, MD  pantoprazole (PROTONIX) 40 MG tablet Take 40 mg by mouth daily.    Historical Provider, MD  polyethylene glycol (MIRALAX / GLYCOLAX) packet Take 17 g by mouth 2 (two) times daily.    Historical Provider, MD  potassium citrate (UROCIT-K) 10 MEQ (1080 MG) SR tablet Take 20 mEq by mouth 3 (three) times daily with meals.    Historical Provider, MD   BP 134/82 mmHg  Pulse 98  Temp(Src) 98.2 F (36.8 C)  Resp 20  Ht 4\' 10"  (1.473 m)  Wt 245 lb (111.131 kg)  BMI 51.22 kg/m2  SpO2 96%  LMP 08/06/2014 Physical Exam  Nursing  note and vitals reviewed.  Morbidly obese 23 year old female, resting comfortably and in no acute distress. Vital signs are normal. Oxygen saturation is 96%, which is normal. Head is normocephalic and atraumatic. PERRLA, EOMI. Oropharynx is clear. Neck is nontender and supple without adenopathy or JVD. Back is nontender in the midline. There is mild right CVA tenderness. Lungs are clear without rales, wheezes, or rhonchi. Chest is nontender. Heart has regular rate and rhythm without murmur. Abdomen is soft, flat, with mild tenderness diffusely through the right side of the abdomen. There is no rebound or guarding. There are no masses or hepatosplenomegaly and peristalsis is hypoactive. Extremities have no cyanosis  or edema, full range of motion is present. Skin is warm and dry without rash. Neurologic: Mental status is normal, cranial nerves are intact, there are no motor or sensory deficits.  ED Course  Procedures (including critical care time) Labs Review Results for orders placed or performed during the hospital encounter of 08/24/14  Comprehensive metabolic panel  Result Value Ref Range   Sodium 138 137 - 147 mEq/L   Potassium 3.9 3.7 - 5.3 mEq/L   Chloride 106 96 - 112 mEq/L   CO2 16 (L) 19 - 32 mEq/L   Glucose, Bld 91 70 - 99 mg/dL   BUN 11 6 - 23 mg/dL   Creatinine, Ser 5.361.29 (H) 0.50 - 1.10 mg/dL   Calcium 8.8 8.4 - 64.410.5 mg/dL   Total Protein 8.4 (H) 6.0 - 8.3 g/dL   Albumin 4.1 3.5 - 5.2 g/dL   AST 12 0 - 37 U/L   ALT 13 0 - 35 U/L   Alkaline Phosphatase 151 (H) 39 - 117 U/L   Total Bilirubin 0.4 0.3 - 1.2 mg/dL   GFR calc non Af Amer 58 (L) >90 mL/min   GFR calc Af Amer 68 (L) >90 mL/min   Anion gap 16 (H) 5 - 15  Lipase, blood  Result Value Ref Range   Lipase 40 11 - 59 U/L  CBC with Differential  Result Value Ref Range   WBC 10.2 4.0 - 10.5 K/uL   RBC 4.55 3.87 - 5.11 MIL/uL   Hemoglobin 11.2 (L) 12.0 - 15.0 g/dL   HCT 03.436.7 74.236.0 - 59.546.0 %   MCV 80.7 78.0 - 100.0 fL   MCH 24.6 (L) 26.0 - 34.0 pg   MCHC 30.5 30.0 - 36.0 g/dL   RDW 63.818.3 (H) 75.611.5 - 43.315.5 %   Platelets 256 150 - 400 K/uL   Neutrophils Relative % 80 (H) 43 - 77 %   Neutro Abs 8.2 (H) 1.7 - 7.7 K/uL   Lymphocytes Relative 14 12 - 46 %   Lymphs Abs 1.4 0.7 - 4.0 K/uL   Monocytes Relative 5 3 - 12 %   Monocytes Absolute 0.5 0.1 - 1.0 K/uL   Eosinophils Relative 1 0 - 5 %   Eosinophils Absolute 0.1 0.0 - 0.7 K/uL   Basophils Relative 0 0 - 1 %   Basophils Absolute 0.0 0.0 - 0.1 K/uL  Pregnancy, urine  Result Value Ref Range   Preg Test, Ur NEGATIVE NEGATIVE  Urinalysis, Routine w reflex microscopic  Result Value Ref Range   Color, Urine YELLOW YELLOW   APPearance CLEAR CLEAR   Specific Gravity, Urine  1.015 1.005 - 1.030   pH 7.5 5.0 - 8.0   Glucose, UA NEGATIVE NEGATIVE mg/dL   Hgb urine dipstick SMALL (A) NEGATIVE   Bilirubin Urine NEGATIVE NEGATIVE   Ketones,  ur NEGATIVE NEGATIVE mg/dL   Protein, ur 30 (A) NEGATIVE mg/dL   Urobilinogen, UA 0.2 0.0 - 1.0 mg/dL   Nitrite NEGATIVE NEGATIVE   Leukocytes, UA MODERATE (A) NEGATIVE  Urine microscopic-add on  Result Value Ref Range   Squamous Epithelial / LPF FEW (A) RARE   WBC, UA 11-20 <3 WBC/hpf   RBC / HPF 3-6 <3 RBC/hpf   Bacteria, UA FEW (A) RARE   Imaging Review Dg Abd 1 View  08/24/2014   CLINICAL DATA:  23 year old female with renal failure and recently placed right double-J ureteral stent. Presents with right flank pain, nausea and vomiting  EXAM: ABDOMEN - 1 VIEW  COMPARISON:  CT abdomen/ pelvis 07/08/2014  FINDINGS: Right double-J ureteral stent. The proximal loop is reconstituted and overlies expected region of the UPJ. The distal loop overlies the bladder. Bilateral medullary nephrocalcinosis. The left kidney is atrophic. Surgical changes of the right hemi abdomen suggest prior partial colectomy. No acute osseous abnormality. No bowel obstruction. Osseous structures are intact and unremarkable.  IMPRESSION: 1. Right double-J ureteral stent in appropriate position with the proximal loop overlying the UPJ and the distal loop overlying the bladder. 2. Bilateral medullary nephrocalcinosis. 3. Left renal atrophy.   Electronically Signed   By: Malachy MoanHeath  McCullough M.D.   On: 08/24/2014 02:59   Images viewed by me.  MDM   Final diagnoses:  Right flank pain  Nephrolithiasis  Renal insufficiency  Renal tubular acidosis    Right flank pain consistent with renal colic,. Old records are reviewed and she has had 5 CT scans in the last 2 years so each of which show nephrocalcinosis with a large calculus in the right renal pelvis. I do not see an indication for repeat CT scan. KUB will be obtained to ensure that the stent is appropriately  placed and she'll be given IV fluids as well as hydromorphone for pain and ondansetron for nausea and screening labs obtained including BUN and creatinine.  KUB shows appropriate placement of stent. Patient did request a distal narcotic as well as additional antiemetic. ED workup does not show evidence of any serious pathology. This is explained to the patient. I've also reviewed her record on the [place] controlled substance reporting website and she has been getting very frequent narcotic prescriptions. I have explained to her that she should try to get all of her narcotics from one provider. She did mention that she has been referred to a pain management clinic. Persistent metabolic acidosis with home with their frequent hypo-K leukemia is strongly suggestive of renal tubular acidosis which probably accounts for her nephrolithiasis as well. She is discharged with prescription for promethazine and oxycodone acetaminophen and is to follow-up with her urologist in the next 3 days.  Dione Boozeavid Osiah Haring, MD 08/24/14 [phone]

## 2014-08-24 NOTE — ED Notes (Signed)
EDP at bedside  

## 2014-08-24 NOTE — Discharge Instructions (Signed)
Follow up with your urologist and your nephrologist.

## 2014-08-24 NOTE — ED Notes (Addendum)
Pt c/o right flank pain, lower right sided abdominal pain, n/v/d. Pt states she has a stent in her right kidney due to kidney failure Nov. 15th

## 2014-10-16 ENCOUNTER — Encounter (HOSPITAL_COMMUNITY): Payer: Self-pay | Admitting: *Deleted

## 2014-10-16 ENCOUNTER — Emergency Department (HOSPITAL_COMMUNITY)
Admission: EM | Admit: 2014-10-16 | Discharge: 2014-10-16 | Disposition: A | Discharge status: Home / Self Care | Payer: Medicaid - Out of State | Attending: Emergency Medicine | Admitting: Emergency Medicine

## 2014-10-16 ENCOUNTER — Emergency Department (HOSPITAL_COMMUNITY): Payer: Medicaid - Out of State

## 2014-10-16 DIAGNOSIS — Z9889 Other specified postprocedural states: Secondary | ICD-10-CM | POA: Diagnosis not present

## 2014-10-16 DIAGNOSIS — G8929 Other chronic pain: Secondary | ICD-10-CM | POA: Diagnosis not present

## 2014-10-16 DIAGNOSIS — Z9089 Acquired absence of other organs: Secondary | ICD-10-CM | POA: Insufficient documentation

## 2014-10-16 DIAGNOSIS — K439 Ventral hernia without obstruction or gangrene: Secondary | ICD-10-CM | POA: Diagnosis not present

## 2014-10-16 DIAGNOSIS — R319 Hematuria, unspecified: Secondary | ICD-10-CM | POA: Insufficient documentation

## 2014-10-16 DIAGNOSIS — Z79899 Other long term (current) drug therapy: Secondary | ICD-10-CM | POA: Diagnosis not present

## 2014-10-16 DIAGNOSIS — Z8639 Personal history of other endocrine, nutritional and metabolic disease: Secondary | ICD-10-CM | POA: Diagnosis not present

## 2014-10-16 DIAGNOSIS — Z87442 Personal history of urinary calculi: Secondary | ICD-10-CM | POA: Diagnosis not present

## 2014-10-16 DIAGNOSIS — R1031 Right lower quadrant pain: Secondary | ICD-10-CM | POA: Diagnosis present

## 2014-10-16 DIAGNOSIS — R109 Unspecified abdominal pain: Secondary | ICD-10-CM

## 2014-10-16 DIAGNOSIS — R42 Dizziness and giddiness: Secondary | ICD-10-CM | POA: Insufficient documentation

## 2014-10-16 DIAGNOSIS — Z3202 Encounter for pregnancy test, result negative: Secondary | ICD-10-CM | POA: Diagnosis not present

## 2014-10-16 LAB — COMPREHENSIVE METABOLIC PANEL
ALK PHOS: 156 U/L — AB (ref 39–117)
ALT: 15 U/L (ref 0–35)
ANION GAP: 5 (ref 5–15)
AST: 16 U/L (ref 0–37)
Albumin: 3.7 g/dL (ref 3.5–5.2)
BILIRUBIN TOTAL: 0.6 mg/dL (ref 0.3–1.2)
BUN: 11 mg/dL (ref 6–23)
CHLORIDE: 116 meq/L — AB (ref 96–112)
CO2: 15 mmol/L — AB (ref 19–32)
Calcium: 8 mg/dL — ABNORMAL LOW (ref 8.4–10.5)
Creatinine, Ser: 1.63 mg/dL — ABNORMAL HIGH (ref 0.50–1.10)
GFR, EST AFRICAN AMERICAN: 51 mL/min — AB (ref >90)
GFR, EST NON AFRICAN AMERICAN: 44 mL/min — AB (ref >90)
Glucose, Bld: 97 mg/dL (ref 70–99)
POTASSIUM: 2.8 mmol/L — AB (ref 3.5–5.1)
SODIUM: 136 mmol/L (ref 135–145)
Total Protein: 7.4 g/dL (ref 6.0–8.3)

## 2014-10-16 LAB — URINALYSIS, ROUTINE W REFLEX MICROSCOPIC
Bilirubin Urine: NEGATIVE
GLUCOSE, UA: NEGATIVE mg/dL
Ketones, ur: NEGATIVE mg/dL
Nitrite: NEGATIVE
PH: 6.5 (ref 5.0–8.0)
Protein, ur: NEGATIVE mg/dL
Specific Gravity, Urine: <1.005 — ABNORMAL LOW (ref 1.005–1.030)
Urobilinogen, UA: 0.2 mg/dL (ref 0.0–1.0)

## 2014-10-16 LAB — CBC WITH DIFFERENTIAL/PLATELET
Basophils Absolute: 0 10*3/uL (ref 0.0–0.1)
Basophils Relative: 0 % (ref 0–1)
EOS ABS: 0.1 10*3/uL (ref 0.0–0.7)
Eosinophils Relative: 1 % (ref 0–5)
HCT: 32.9 % — ABNORMAL LOW (ref 36.0–46.0)
HEMOGLOBIN: 10 g/dL — AB (ref 12.0–15.0)
Lymphocytes Relative: 18 % (ref 12–46)
Lymphs Abs: 1.7 10*3/uL (ref 0.7–4.0)
MCH: 25.1 pg — AB (ref 26.0–34.0)
MCHC: 30.4 g/dL (ref 30.0–36.0)
MCV: 82.7 fL (ref 78.0–100.0)
MONOS PCT: 5 % (ref 3–12)
Monocytes Absolute: 0.4 10*3/uL (ref 0.1–1.0)
NEUTROS PCT: 76 % (ref 43–77)
Neutro Abs: 7.3 10*3/uL (ref 1.7–7.7)
PLATELETS: 257 10*3/uL (ref 150–400)
RBC: 3.98 MIL/uL (ref 3.87–5.11)
RDW: 18.6 % — ABNORMAL HIGH (ref 11.5–15.5)
WBC: 9.5 10*3/uL (ref 4.0–10.5)

## 2014-10-16 LAB — URINE MICROSCOPIC-ADD ON

## 2014-10-16 LAB — PREGNANCY, URINE: Preg Test, Ur: NEGATIVE

## 2014-10-16 LAB — LIPASE, BLOOD: Lipase: 31 U/L (ref 11–59)

## 2014-10-16 MED ORDER — PROMETHAZINE HCL 25 MG PO TABS: 25.0000 mg | ORAL_TABLET | Freq: Four times a day (QID) | ORAL | Status: DC | PRN

## 2014-10-16 MED ORDER — HYDROMORPHONE HCL 1 MG/ML IJ SOLN
1.0000 mg | Freq: Once | INTRAMUSCULAR | Status: AC
  Administered 2014-10-16: 1 mg via INTRAVENOUS
  Filled 2014-10-16: qty 1

## 2014-10-16 MED ORDER — IOHEXOL 300 MG/ML  SOLN
100.0000 mL | Freq: Once | INTRAMUSCULAR | Status: AC | PRN
  Administered 2014-10-16: 80 mL via INTRAVENOUS

## 2014-10-16 MED ORDER — HYDROCODONE-ACETAMINOPHEN 5-325 MG PO TABS: 1.0000 | ORAL_TABLET | Freq: Four times a day (QID) | ORAL | Status: DC | PRN

## 2014-10-16 MED ORDER — SODIUM CHLORIDE 0.9 % IV BOLUS (SEPSIS)
250.0000 mL | Freq: Once | INTRAVENOUS | Status: AC
  Administered 2014-10-16: 250 mL via INTRAVENOUS

## 2014-10-16 MED ORDER — ONDANSETRON HCL 4 MG/2ML IJ SOLN
4.0000 mg | Freq: Once | INTRAMUSCULAR | Status: AC
  Administered 2014-10-16: 4 mg via INTRAVENOUS
  Filled 2014-10-16: qty 2

## 2014-10-16 MED ORDER — ONDANSETRON 4 MG PO TBDP: 4.0000 mg | ORAL_TABLET | Freq: Three times a day (TID) | ORAL | Status: DC | PRN

## 2014-10-16 MED ORDER — SODIUM CHLORIDE 0.9 % IV SOLN: INTRAVENOUS | Status: DC

## 2014-10-16 NOTE — ED Provider Notes (Signed)
CSN: [id number]     Arrival date & time 10/16/14  0907 History  This chart was scribed for Vanetta MuldersScott Puja Caffey, MD by Milly JakobJohn Lee Graves, ED Scribe. The patient was seen in room APA01/APA01. Patient's care was started at 10:51 AM.   Chief Complaint  Patient presents with  . Abdominal Pain  . Flank Pain   Patient is a 24 y.o. female presenting with abdominal pain. The history is provided by the patient. No language interpreter was used.  Abdominal Pain Pain location:  RUQ and RLQ Pain quality: sharp   Pain radiates to:  R flank Pain severity:  Severe Onset quality:  Gradual Duration:  10 days Timing:  Constant Progression:  Worsening Chronicity:  New Relieved by:  Nothing Worsened by:  Nothing tried Ineffective treatments:  None tried Associated symptoms: dysuria, hematuria and nausea   Associated symptoms: no chest pain, no cough, no fever and no shortness of breath    HPI Comments: Chelsea [name] is a 24 y.o. female who presents to the Emergency Department complaining of sharp, constant, right sided abdomina pain for 1.5 weeks that radiates to her right flank. She reports associated, intermittent, nausea and vomiting which became acutely worse 2 days ago. She states that this is exacerbated by eating and drinking, and she has not been able to keep food down for two days. She additionally reports dysuria, hematuria, headache and dizziness for the past week. She denies hematemesis. She reports a history of stent placement in her right kidney done [place]   Past Medical History  Diagnosis Date  . Kidney stones   . Rickets   . Constipation    Past Surgical History  Procedure Laterality Date  . Ureteral stents    . Has only 1 functioning  kidney    . Colon surgery    . Appendectomy    . Leg surgery    . Extracorporeal shock wave lithotripsy Right 03/21/2013    Procedure: EXTRACORPOREAL SHOCK WAVE LITHOTRIPSY (ESWL) RIGHT URETERAL CALCULUS;  Surgeon: Ky BarbanMohammad I  Javaid, MD;  Location: [facility];  Service: Urology;  Laterality: Right;  . Cystoscopy w/ ureteral stent placement Right 10/15/2013    Procedure: CYSTOSCOPY WITH RETROGRADE PYELOGRAM/URETERAL STENT PLACEMENT;  Surgeon: Ky BarbanMohammad I Javaid, MD;  Location: [facility];  Service: Urology;  Laterality: Right;   Family History  Problem Relation Age of Onset  . Kidney disease Father    History  Substance Use Topics  . Smoking status: Never Smoker   . Smokeless tobacco: Not on file  . Alcohol Use: No   OB History    Gravida Para Term Preterm AB TAB SAB Ectopic Multiple Living            0     Review of Systems  Constitutional: Negative for fever.  HENT: Negative for rhinorrhea.   Eyes: Negative for visual disturbance.  Respiratory: Negative for cough and shortness of breath.   Cardiovascular: Negative for chest pain and leg swelling.  Gastrointestinal: Positive for nausea and abdominal pain.  Genitourinary: Positive for dysuria, hematuria and flank pain.  Musculoskeletal: Negative for back pain and neck pain.  Skin: Negative for rash.  Neurological: Positive for dizziness. Negative for light-headedness.  Hematological: Does not bruise/bleed easily.  Psychiatric/Behavioral: Negative for confusion.   Allergies  Review of patient's allergies indicates no known allergies.  Home Medications   Prior to Admission medications   Medication Sig Start Date End Date Taking? Authorizing Provider  docusate sodium (COLACE)  100 MG capsule Take 100 mg by mouth daily as needed for mild constipation.    Yes Historical Provider, MD  ferrous sulfate (FERROUSUL) 325 (65 FE) MG tablet Take 325 mg by mouth at bedtime.   Yes Historical Provider, MD  magnesium hydroxide (MILK OF MAGNESIA) 400 MG/5ML suspension Take 30 mLs by mouth daily as needed for mild constipation.   Yes Historical Provider, MD  MAGNESIUM PO Take 1 tablet by mouth daily.   Yes Historical Provider, MD  pantoprazole (PROTONIX) 40 MG tablet Take 40  mg by mouth 2 (two) times daily.    Yes Historical Provider, MD  polyethylene glycol (MIRALAX / GLYCOLAX) packet Take 17 g by mouth 2 (two) times daily.   Yes Historical Provider, MD  potassium citrate (UROCIT-K) 10 MEQ (1080 MG) SR tablet Take 20 mEq by mouth 3 (three) times daily with meals.   Yes Historical Provider, MD  promethazine (PHENERGAN) 25 MG tablet Take 1 tablet (25 mg total) by mouth every 6 (six) hours as needed for nausea or vomiting. 08/24/14  Yes Dione Booze, MD  sodium bicarbonate 325 MG tablet Take 325 mg by mouth daily.   Yes Historical Provider, MD  HYDROcodone-acetaminophen (NORCO/VICODIN) 5-325 MG per tablet Take 1-2 tablets by mouth every 6 (six) hours as needed. 10/16/14   Vanetta Mulders, MD  ondansetron (ZOFRAN ODT) 4 MG disintegrating tablet Take 1 tablet (4 mg total) by mouth every 8 (eight) hours as needed. 10/16/14   Vanetta Mulders, MD  oxyCODONE-acetaminophen (PERCOCET/ROXICET) 5-325 MG per tablet Take 1 tablet by mouth every 4 (four) hours as needed. Patient not taking: Reported on 10/16/2014 08/24/14   Dione Booze, MD  promethazine (PHENERGAN) 25 MG tablet Take 1 tablet (25 mg total) by mouth every 6 (six) hours as needed. 10/16/14   Vanetta Mulders, MD   Triage Vitals: BP 126/63 mmHg  Pulse 94  Temp(Src) 99.2 F (37.3 C) (Oral)  Resp 16  Ht 4\' 10"  (1.473 m)  Wt 235 lb (106.595 kg)  BMI 49.13 kg/m2  SpO2 97%  LMP 08/07/2014 Physical Exam  Constitutional: She is oriented to person, place, and time. She appears well-developed and well-nourished. No distress.  HENT:  Head: Normocephalic and atraumatic.  Mouth/Throat: Oropharynx is clear and moist.  Eyes: Conjunctivae and EOM are normal. Pupils are equal, round, and reactive to light. No scleral icterus.  Neck: Neck supple. No tracheal deviation present.  Cardiovascular: Normal rate, regular rhythm and normal heart sounds.   No murmur heard. Pulmonary/Chest: Effort normal and breath sounds normal. No  respiratory distress. She has no wheezes. She has no rales. She exhibits no tenderness.  Abdominal: Soft. Bowel sounds are normal. She exhibits no distension and no mass. There is tenderness (RUQ). There is CVA tenderness (right). There is no rebound and no guarding.  Musculoskeletal: Normal range of motion. She exhibits no edema.  Neurological: She is alert and oriented to person, place, and time. She has normal reflexes. No cranial nerve deficit.  Skin: Skin is warm and dry.  Psychiatric: She has a normal mood and affect. Her behavior is normal.  Nursing note and vitals reviewed.   ED Course  Procedures (including critical care time) DIAGNOSTIC STUDIES: Oxygen Saturation is 97% on room air, normal by my interpretation.    COORDINATION OF CARE: 10:56 AM-Discussed treatment plan which includes UA, lab work, abdominal CT-scan, and pain and nausea medication with pt at bedside and pt agreed to plan.   Results for orders placed or performed during  the hospital encounter of 10/16/14  Urinalysis, Routine w reflex microscopic  Result Value Ref Range   Color, Urine YELLOW YELLOW   APPearance HAZY (A) CLEAR   Specific Gravity, Urine <1.005 (L) 1.005 - 1.030   pH 6.5 5.0 - 8.0   Glucose, UA NEGATIVE NEGATIVE mg/dL   Hgb urine dipstick TRACE (A) NEGATIVE   Bilirubin Urine NEGATIVE NEGATIVE   Ketones, ur NEGATIVE NEGATIVE mg/dL   Protein, ur NEGATIVE NEGATIVE mg/dL   Urobilinogen, UA 0.2 0.0 - 1.0 mg/dL   Nitrite NEGATIVE NEGATIVE   Leukocytes, UA LARGE (A) NEGATIVE  Comprehensive metabolic panel  Result Value Ref Range   Sodium 136 135 - 145 mmol/L   Potassium 2.8 (L) 3.5 - 5.1 mmol/L   Chloride 116 (H) 96 - 112 mEq/L   CO2 15 (L) 19 - 32 mmol/L   Glucose, Bld 97 70 - 99 mg/dL   BUN 11 6 - 23 mg/dL   Creatinine, Ser 1.61 (H) 0.50 - 1.10 mg/dL   Calcium 8.0 (L) 8.4 - 10.5 mg/dL   Total Protein 7.4 6.0 - 8.3 g/dL   Albumin 3.7 3.5 - 5.2 g/dL   AST 16 0 - 37 U/L   ALT 15 0 - 35 U/L    Alkaline Phosphatase 156 (H) 39 - 117 U/L   Total Bilirubin 0.6 0.3 - 1.2 mg/dL   GFR calc non Af Amer 44 (L) >90 mL/min   GFR calc Af Amer 51 (L) >90 mL/min   Anion gap 5 5 - 15  Lipase, blood  Result Value Ref Range   Lipase 31 11 - 59 U/L  CBC with Differential  Result Value Ref Range   WBC 9.5 4.0 - 10.5 K/uL   RBC 3.98 3.87 - 5.11 MIL/uL   Hemoglobin 10.0 (L) 12.0 - 15.0 g/dL   HCT 09.6 (L) 04.5 - 40.9 %   MCV 82.7 78.0 - 100.0 fL   MCH 25.1 (L) 26.0 - 34.0 pg   MCHC 30.4 30.0 - 36.0 g/dL   RDW 81.1 (H) 91.4 - 78.2 %   Platelets 257 150 - 400 K/uL   Neutrophils Relative % 76 43 - 77 %   Neutro Abs 7.3 1.7 - 7.7 K/uL   Lymphocytes Relative 18 12 - 46 %   Lymphs Abs 1.7 0.7 - 4.0 K/uL   Monocytes Relative 5 3 - 12 %   Monocytes Absolute 0.4 0.1 - 1.0 K/uL   Eosinophils Relative 1 0 - 5 %   Eosinophils Absolute 0.1 0.0 - 0.7 K/uL   Basophils Relative 0 0 - 1 %   Basophils Absolute 0.0 0.0 - 0.1 K/uL  Pregnancy, urine  Result Value Ref Range   Preg Test, Ur NEGATIVE NEGATIVE  Urine microscopic-add on  Result Value Ref Range   Squamous Epithelial / LPF MANY (A) RARE   WBC, UA TOO NUMEROUS TO COUNT <3 WBC/hpf   RBC / HPF 3-6 <3 RBC/hpf   Bacteria, UA FEW (A) RARE   Ct Abdomen Pelvis W Contrast  10/16/2014   CLINICAL DATA:  Right-sided abdominal pain, lower abdominal pain for 1-2 weeks. Known hernia.  EXAM: CT ABDOMEN AND PELVIS WITH CONTRAST  TECHNIQUE: Multidetector CT imaging of the abdomen and pelvis was performed using the standard protocol following bolus administration of intravenous contrast.  CONTRAST:  80mL OMNIPAQUE IOHEXOL 300 MG/ML  SOLN  COMPARISON:  07/08/2014  FINDINGS: There is an umbilical hernia containing small bowel loops. Two other supraumbilical ventral hernias, both wide  mouth. This superior hernia contains the transverse colon. The adjacent more inferior ventral hernia containing small bowel loops There is a single loop of small bowel within the middle  ventral hernia which is mildly dilated. No small bowel dilatation within the abdomen or pelvis however.  Liver, gallbladder, spleen, pancreas, adrenals are unremarkable. 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 Left kidney is severely atrophic. Right ureteral stent is in place. No hydronephrosis.  Aorta is normal caliber. Uterus and left ovary are unremarkable. Small cyst in the right ovary measuring 3.7 cm, likely functional cyst. No free fluid or free air.  Lung bases are clear. No effusions. Heart is normal size. No acute bony abnormality or focal bone lesion.  IMPRESSION: Three ventral hernias as described above. The middle ventral hernia contains a single mildly prominent small bowel loop, but no evidence of small bowel dilatation within the abdomen or pelvis to suggest obstruction at this time.  Medullary nephrocalcinosis. Left renal atrophy. Findings are stable.   Electronically Signed   By: Charlett Nose M.D.   On: 10/16/2014 12:29    Medications  0.9 %  sodium chloride infusion (not administered)  HYDROmorphone (DILAUDID) injection 1 mg (not administered)  ondansetron (ZOFRAN) injection 4 mg (not administered)  sodium chloride 0.9 % bolus 250 mL (250 mLs Intravenous New Bag/Given 10/16/14 1219)  ondansetron (ZOFRAN) injection 4 mg (4 mg Intravenous Given 10/16/14 1145)  HYDROmorphone (DILAUDID) injection 1 mg (1 mg Intravenous Given 10/16/14 1145)  iohexol (OMNIPAQUE) 300 MG/ML solution 100 mL (80 mLs Intravenous Contrast Given 10/16/14 1159)     EKG Interpretation None      MDM   Final diagnoses:  Abdominal pain  Ventral hernia without obstruction or gangrene    Patient with history of ureteral stent and also chronic abdominal pain. CT scan with contrast done to evaluate the hernias. There are evidence of ventral hernias was small bowel loops but no evidence of obstruction or strangulation. Patient still has a stent in place. Urinalysis appears contaminated urine culture sent to delineate.  Patient's urologist is in [place]. Patient is to be following up with them the early part of next week. Patient stable for discharge home with pain medicine improved here. Discharged home with Zofran and Phenergan and symmetrical.    I personally performed the services described in this documentation, which was scribed in my presence. The recorded information has been reviewed and is accurate.     Vanetta Mulders, MD 10/16/14 1349

## 2014-10-16 NOTE — ED Notes (Signed)
CT called and reported pt requesting nausea and pain med prior to CT scan. Meds brought to CT and admin by this RN. Pt tolerated well.

## 2014-10-16 NOTE — ED Notes (Signed)
Pt states right flank pain and abdominal pain . States intermittent pain and vomiting began 5 days ago but has remained constant x 2 days with worsening pain. Unable to keep food or liquids down x 2 days.

## 2014-10-16 NOTE — Discharge Instructions (Signed)
Follow-up with your urologist and your regular doctor in the next few days. Take Zofran as directed for nausea and vomiting. The can also take the Phenergan. Take the hydrocodone as needed for pain.

## 2014-10-17 LAB — URINE CULTURE: Colony Count: 9000

## 2014-11-20 ENCOUNTER — Emergency Department (HOSPITAL_COMMUNITY)
Admission: EM | Admit: 2014-11-20 | Discharge: 2014-11-20 | Disposition: A | Discharge status: Home / Self Care | Payer: Medicaid - Out of State | Attending: Emergency Medicine | Admitting: Emergency Medicine

## 2014-11-20 ENCOUNTER — Emergency Department (HOSPITAL_COMMUNITY): Payer: Medicaid - Out of State

## 2014-11-20 ENCOUNTER — Encounter (HOSPITAL_COMMUNITY): Payer: Self-pay | Admitting: Emergency Medicine

## 2014-11-20 DIAGNOSIS — Z8639 Personal history of other endocrine, nutritional and metabolic disease: Secondary | ICD-10-CM | POA: Insufficient documentation

## 2014-11-20 DIAGNOSIS — R112 Nausea with vomiting, unspecified: Secondary | ICD-10-CM | POA: Insufficient documentation

## 2014-11-20 DIAGNOSIS — Z79899 Other long term (current) drug therapy: Secondary | ICD-10-CM | POA: Diagnosis not present

## 2014-11-20 DIAGNOSIS — Z3202 Encounter for pregnancy test, result negative: Secondary | ICD-10-CM | POA: Diagnosis not present

## 2014-11-20 DIAGNOSIS — Z87442 Personal history of urinary calculi: Secondary | ICD-10-CM | POA: Insufficient documentation

## 2014-11-20 DIAGNOSIS — R109 Unspecified abdominal pain: Secondary | ICD-10-CM | POA: Insufficient documentation

## 2014-11-20 DIAGNOSIS — Z9889 Other specified postprocedural states: Secondary | ICD-10-CM | POA: Diagnosis not present

## 2014-11-20 DIAGNOSIS — Z9089 Acquired absence of other organs: Secondary | ICD-10-CM | POA: Diagnosis not present

## 2014-11-20 DIAGNOSIS — G8929 Other chronic pain: Secondary | ICD-10-CM

## 2014-11-20 DIAGNOSIS — Z8719 Personal history of other diseases of the digestive system: Secondary | ICD-10-CM | POA: Insufficient documentation

## 2014-11-20 HISTORY — DX: Other chronic pain: G89.29

## 2014-11-20 HISTORY — DX: Ventral hernia without obstruction or gangrene: K43.9

## 2014-11-20 HISTORY — DX: Unspecified abdominal pain: R10.9

## 2014-11-20 HISTORY — DX: Nausea with vomiting, unspecified: R11.2

## 2014-11-20 LAB — URINALYSIS, ROUTINE W REFLEX MICROSCOPIC
Bilirubin Urine: NEGATIVE
Glucose, UA: NEGATIVE mg/dL
Hgb urine dipstick: NEGATIVE
KETONES UR: NEGATIVE mg/dL
NITRITE: NEGATIVE
PROTEIN: NEGATIVE mg/dL
Specific Gravity, Urine: 1.01 (ref 1.005–1.030)
UROBILINOGEN UA: 0.2 mg/dL (ref 0.0–1.0)
pH: 7.5 (ref 5.0–8.0)

## 2014-11-20 LAB — URINE MICROSCOPIC-ADD ON

## 2014-11-20 LAB — PREGNANCY, URINE: Preg Test, Ur: NEGATIVE

## 2014-11-20 MED ORDER — PROMETHAZINE HCL 12.5 MG PO TABS: 25.0000 mg | ORAL_TABLET | Freq: Once | ORAL | Status: DC

## 2014-11-20 MED ORDER — OXYCODONE-ACETAMINOPHEN 5-325 MG PO TABS
2.0000 | ORAL_TABLET | Freq: Once | ORAL | Status: AC
  Administered 2014-11-20: 2 via ORAL
  Filled 2014-11-20: qty 2

## 2014-11-20 MED ORDER — PROMETHAZINE HCL 12.5 MG PO TABS
25.0000 mg | ORAL_TABLET | Freq: Once | ORAL | Status: AC
  Administered 2014-11-20: 25 mg via ORAL

## 2014-11-20 MED ORDER — GI COCKTAIL ~~LOC~~
30.0000 mL | Freq: Once | ORAL | Status: AC
  Administered 2014-11-20: 30 mL via ORAL
  Filled 2014-11-20: qty 30

## 2014-11-20 MED ORDER — PROMETHAZINE HCL 25 MG PO TABS: 25.0000 mg | ORAL_TABLET | Freq: Four times a day (QID) | ORAL | Status: DC | PRN

## 2014-11-20 MED ORDER — PROMETHAZINE HCL 12.5 MG PO TABS
ORAL_TABLET | ORAL | Status: AC
Start: 2014-11-20 — End: 2014-11-20
  Administered 2014-11-20: 25 mg via ORAL
  Filled 2014-11-20: qty 2

## 2014-11-20 MED ORDER — ONDANSETRON HCL 4 MG/2ML IJ SOLN
4.0000 mg | INTRAMUSCULAR | Status: DC | PRN
  Administered 2014-11-20: 4 mg via INTRAVENOUS
  Filled 2014-11-20: qty 2

## 2014-11-20 NOTE — ED Provider Notes (Signed)
CSN: [id number]     Arrival date & time 11/20/14  0730 History   First MD Initiated Contact with Patient 11/20/14 0745     Chief Complaint  Patient presents with  . Flank Pain  . Emesis      HPI Pt was seen at 0810. Per pt, c/o gradual onset and persistence of constant acute flair of her chronic right sided flank "pain" for the past 3 days. States the pain radiates into the right side of her abd, "like my last kidney stone." Has been associated with several episodes of N/V. Denies any change in her usual chronic pain pattern.  Denies incont/retention of bowel or bladder, no saddle anesthesia, no focal motor weakness, no tingling/numbness in extremities, no fevers, no injury.   The symptoms have been associated with no other complaints. The patient has a significant history of similar symptoms previously, recently being evaluated for this complaint and multiple prior evals for same.     Past Medical History  Diagnosis Date  . Rickets   . Constipation   . Ventral hernia   . Chronic flank pain   . Chronic abdominal pain   . Nausea and vomiting     chronic, recurrent  . Kidney stones    Past Surgical History  Procedure Laterality Date  . Ureteral stents    . Has only 1 functioning  kidney    . Colon surgery    . Appendectomy    . Leg surgery    . Extracorporeal shock wave lithotripsy Right 03/21/2013    Procedure: EXTRACORPOREAL SHOCK WAVE LITHOTRIPSY (ESWL) RIGHT URETERAL CALCULUS;  Surgeon: Ky BarbanMohammad I Javaid, MD;  Location: [facility];  Service: Urology;  Laterality: Right;  . Cystoscopy w/ ureteral stent placement Right 10/15/2013    Procedure: CYSTOSCOPY WITH RETROGRADE PYELOGRAM/URETERAL STENT PLACEMENT;  Surgeon: Ky BarbanMohammad I Javaid, MD;  Location: [facility];  Service: Urology;  Laterality: Right;   Family History  Problem Relation Age of Onset  . Kidney disease Father    History  Substance Use Topics  . Smoking status: Never Smoker   . Smokeless tobacco: Not on file  . Alcohol  Use: No    Review of Systems ROS: Statement: All systems negative except as marked or noted in the HPI; Constitutional: Negative for fever and chills. ; ; Eyes: Negative for eye pain, redness and discharge. ; ; ENMT: Negative for ear pain, hoarseness, nasal congestion, sinus pressure and sore throat. ; ; Cardiovascular: Negative for chest pain, palpitations, diaphoresis, dyspnea and peripheral edema. ; ; Respiratory: Negative for cough, wheezing and stridor. ; ; Gastrointestinal: +N/V, abd pain. Negative for diarrhea, blood in stool, hematemesis, jaundice and rectal bleeding. . ; ; Genitourinary: +flank pain. Negative for dysuria and hematuria. ; ; Musculoskeletal: Negative for back pain and neck pain. Negative for swelling and trauma.; ; Skin: Negative for pruritus, rash, abrasions, blisters, bruising and skin lesion.; ; Neuro: Negative for headache, lightheadedness and neck stiffness. Negative for weakness, altered level of consciousness , altered mental status, extremity weakness, paresthesias, involuntary movement, seizure and syncope.     Allergies  Review of patient's allergies indicates no known allergies.  Home Medications   Prior to Admission medications   Medication Sig Start Date End Date Taking? Authorizing Provider  docusate sodium (COLACE) 100 MG capsule Take 100 mg by mouth daily as needed for mild constipation.     Historical Provider, MD  ferrous sulfate (FERROUSUL) 325 (65 FE) MG tablet Take 325 mg by mouth at bedtime.  Historical Provider, MD  HYDROcodone-acetaminophen (NORCO/VICODIN) 5-325 MG per tablet Take 1-2 tablets by mouth every 6 (six) hours as needed. 10/16/14   Vanetta Mulders, MD  magnesium hydroxide (MILK OF MAGNESIA) 400 MG/5ML suspension Take 30 mLs by mouth daily as needed for mild constipation.    Historical Provider, MD  MAGNESIUM PO Take 1 tablet by mouth daily.    Historical Provider, MD  ondansetron (ZOFRAN ODT) 4 MG disintegrating tablet Take 1 tablet (4 mg  total) by mouth every 8 (eight) hours as needed. 10/16/14   Vanetta Mulders, MD  oxyCODONE-acetaminophen (PERCOCET/ROXICET) 5-325 MG per tablet Take 1 tablet by mouth every 4 (four) hours as needed. Patient not taking: Reported on 10/16/2014 08/24/14   Dione Booze, MD  pantoprazole (PROTONIX) 40 MG tablet Take 40 mg by mouth 2 (two) times daily.     Historical Provider, MD  polyethylene glycol (MIRALAX / GLYCOLAX) packet Take 17 g by mouth 2 (two) times daily.    Historical Provider, MD  potassium citrate (UROCIT-K) 10 MEQ (1080 MG) SR tablet Take 20 mEq by mouth 3 (three) times daily with meals.    Historical Provider, MD  promethazine (PHENERGAN) 25 MG tablet Take 1 tablet (25 mg total) by mouth every 6 (six) hours as needed for nausea or vomiting. 08/24/14   Dione Booze, MD  promethazine (PHENERGAN) 25 MG tablet Take 1 tablet (25 mg total) by mouth every 6 (six) hours as needed. 10/16/14   Vanetta Mulders, MD  sodium bicarbonate 325 MG tablet Take 325 mg by mouth daily.    Historical Provider, MD   BP 114/52 mmHg  Pulse 81  Temp(Src) 98.1 F (36.7 C) (Oral)  Resp 18  Ht  (1.473 m)  Wt 230 lb (104.327 kg)  BMI 48.08 kg/m2  SpO2 100%  LMP 10/21/2014 Physical Exam  0815: Physical examination:  Nursing notes reviewed; Vital signs and O2 SAT reviewed;  Constitutional: Well developed, Well nourished, Well hydrated, In no acute distress; Head:  Normocephalic, atraumatic; Eyes: EOMI, PERRL, No scleral icterus; ENMT: Mouth and pharynx normal, Mucous membranes moist; Neck: Supple, Full range of motion, No lymphadenopathy; Cardiovascular: Regular rate and rhythm, No gallop; Respiratory: Breath sounds clear & equal bilaterally, No wheezes.  Speaking full sentences with ease, Normal respiratory effort/excursion; Chest: Nontender, Movement normal; Abdomen: Soft, Nontender, Nondistended, Normal bowel sounds; Genitourinary: No CVA tenderness; Spine:  No midline CS, TS, LS tenderness. +TTP right lumbar  paraspinal muscles. No rash.;; Extremities: Pulses normal, No tenderness, No edema, No calf edema or asymmetry.; Neuro: AA&Ox3, Major CN grossly intact.  Speech clear. No gross focal motor or sensory deficits in extremities.; Skin: Color normal, Warm, Dry.    ED Course  Procedures     EKG Interpretation None      MDM  MDM Reviewed: previous chart, nursing note and vitals Reviewed previous: CT scan and labs Interpretation: labs and CT scan      Results for orders placed or performed during the hospital encounter of 11/20/14  Urinalysis, Routine w reflex microscopic  Result Value Ref Range   Color, Urine YELLOW YELLOW   APPearance CLEAR CLEAR   Specific Gravity, Urine 1.010 1.005 - 1.030   pH 7.5 5.0 - 8.0   Glucose, UA NEGATIVE NEGATIVE mg/dL   Hgb urine dipstick NEGATIVE NEGATIVE   Bilirubin Urine NEGATIVE NEGATIVE   Ketones, ur NEGATIVE NEGATIVE mg/dL   Protein, ur NEGATIVE NEGATIVE mg/dL   Urobilinogen, UA 0.2 0.0 - 1.0 mg/dL   Nitrite NEGATIVE NEGATIVE  Leukocytes, UA MODERATE (A) NEGATIVE  Pregnancy, urine  Result Value Ref Range   Preg Test, Ur NEGATIVE NEGATIVE  Urine microscopic-add on  Result Value Ref Range   Squamous Epithelial / LPF RARE RARE   WBC, UA 7-10 <3 WBC/hpf   Ct Renal Stone Study 11/20/2014   CLINICAL DATA:  24 year old female with right flank pain for 4 weeks and microscopic hematuria. Initial encounter. Nephrolithiasis history.  EXAM: CT ABDOMEN AND PELVIS WITHOUT CONTRAST  TECHNIQUE: Multidetector CT imaging of the abdomen and pelvis was performed following the standard protocol without IV contrast.  COMPARISON:  CT Abdomen and Pelvis 10/16/2014 and earlier.  FINDINGS: Large body habitus.  Stable and negative lung bases. Late subacute to chronic right posterior eleventh rib fracture with callus. No acute osseous abnormality identified.  No pelvic free fluid. Negative non contrast uterus and adnexa. Decompressed rectum. Mildly redundant sigmoid  colon with occasional diverticula but no active inflammation. Negative left colon.  Several broad-based ventral abdominal bowel and mesenteric containing hernias are re - identified, appear stable in size and configuration, and do not appear incarcerated. Both large and small bowel loops are involved. Retained stool in the proximal colon. There is a right colon anastomosis re- identified. Terminal ileum within normal limits. No dilated or inflamed small bowel. Negative stomach and duodenum.  Noncontrast liver, gallbladder, spleen, pancreas and adrenal glands are within normal limits. No abdominal free fluid.  Severe left renal atrophy again noted. Bilateral nephrocalcinosis re- identified. The right double-J ureteral stent is been removed. No hydronephrosis. No perinephric stranding. Confluent amorphous calcification in the right lower pole renal collecting system is stable. No right hydroureter. Minimal residual right periureteral stranding. Diminutive left ureter. Unremarkable bladder. No lymphadenopathy.  IMPRESSION: 1. Interval removed right double-J ureteral stent with no acute obstructive uropathy. 2. Chronic severe left renal atrophy. Extensive bilateral nephrocalcinosis. Bulky right lower pole nephrolithiasis appears stable. 3. Multiple non incarcerated appearing ventral abdominal hernias are stable. No bowel obstruction or focal bowel inflammation. Ascending colon anastomosis. 4. Late subacute to early chronic posterior right eleventh rib fracture.   Electronically Signed   By: Odessa Fleming M.D.   On: 11/20/2014 08:40     [place] accessed: pt has had 6 different narcotic prescriptions written by 5 different providers and filled at 3 different pharmacies in the past 5 weeks.    0900:  No clear UTI on Udip and pt denies dysuria; UC is pending. Pt has tol PO well while in the ED without N/V.  No stooling while in the ED.  Abd remains benign when pt is distracted, VSS. Feels better and wants to go  home now. Long hx of chronic flank pain and recurrent N/V with multiple ED visits for same.  Pt endorses acute flair of her usual long standing chronic symptoms today, no change from her usual chronic pain pattern.  Pt encouraged to f/u with her PMD, GI MD, and Pain Management doctor for good continuity of care and control of her chronic symptoms. Will not rx narcotics given concerning information above. Verb understanding.  Dx and testing d/w pt.  Questions answered.  Verb understanding, agreeable to d/c home with outpt f/u.     Samuel Jester, DO 11/24/14 1144

## 2014-11-20 NOTE — Discharge Instructions (Signed)
°Emergency Department Resource Guide °1) Find a Doctor and Pay Out of Pocket °Although you won't have to find out who is covered by your insurance plan, it is a good idea to ask around and get recommendations. You will then need to call the office and see if the doctor you have chosen will accept you as a new patient and what types of options they offer for patients who are self-pay. Some doctors offer discounts or will set up payment plans for their patients who do not have insurance, but you will need to ask so you aren't surprised when you get to your appointment. ° °2) Contact Your Local Health Department °Not all health departments have doctors that can see patients for sick visits, but many do, so it is worth a call to see if yours does. If you don't know where your local health department is, you can check in your phone book. The CDC also has a tool to help you locate your state's health department, and many state websites also have listings of all of their local health departments. ° °3) Find a Walk-in Clinic °If your illness is not likely to be very severe or complicated, you may want to try a walk in clinic. These are popping up all over the country in pharmacies, drugstores, and shopping centers. They're usually staffed by nurse practitioners or physician assistants that have been trained to treat common illnesses and complaints. They're usually fairly quick and inexpensive. However, if you have serious medical issues or chronic medical problems, these are probably not your best option. ° °No Primary Care Doctor: °- Call [facility] at  832-8000 - they can help you locate a primary care doctor that  accepts your insurance, provides certain services, etc. °- Physician Referral Service- 1-800-533-3463 ° °Chronic Pain Problems: °Organization         Address  Phone   Notes  °[facility]  (336) 297-2271 Patients need to be referred by their primary care doctor.  ° °Medication  Assistance: °Organization         Address  Phone   Notes  °[place] [facility] [address] °[place], [place] [address] (336) 641-8030 --Must be a resident of [place] °-- Must have NO insurance coverage whatsoever (no Medicaid/ Medicare, etc.) °-- The pt. MUST have a primary care doctor that directs their care regularly and follows them in the community °  °[facility]  (866) 331-1348   °[employer]  (888) 892-1162   ° °Agencies that provide inexpensive medical care: °Organization         Address  Phone   Notes  °[facility]  (336) 832-8035   °[facility]    (336) 832-7272   °[facility] [address] °[place], [place] [address] (336) 832-4777   °[facility] [address], °[place] (336) 271-4999   °[facility]    (336) 373-0678   °[facility]    (336) 272-1050   °[facility] ° [address], [place] Phone:  (336) 832-4444, Fax:  (336) 832-4440 Hours of Operation:  9 am - 6 pm, M-F.  Also accepts Medicaid/Medicare and self-pay.  °[facility] ° [address], [place] Phone: (336) 832-3150, Fax: (336) 832-3151. Hours of Operation:  8:30 am - 5:30 pm, M-F.  Also accepts Medicaid and self-pay.  °[facility] [place] [address], [place] Phone: (336) 878-6027   °[facility] [address], [place], [place] (336)723-1848, Ext. 123 Mondays & Thursdays: 7-9 AM.  First 15 patients are seen on a first come, first serve basis. °  ° °Medicaid-accepting [place] Providers: ° °Organization         Address  Phone   Notes  °[facility] [address], [place] (336) 641-2100 Also accepts self-pay patients.  °[facility] [address], [place] ° (336) 856-9996   °[facility] [address], [place]  (336) 288-8857   °[facility] [address] [place] [address], [place] (336) 299-7000   °Veita Bland [address], [place]  ° (336) 373-1557 Only accepts [place] Access Medicaid patients after they have their name applied to their card.  ° °Self-Pay (no insurance) in [place]: ° °Organization         Address  Phone   Notes  °Sickle Cell Patients, [facility] [address], [place] (336) 832-1970   °[facility] [address], [place] (336) 832-4400   °[facility] ° [address], [place] (336) 992-4800   °[facility]/Dr. Osei-Bonsu ° [address] [place] [address], [place] or [address], [place] (336) 841-8500 Phone number for both [place] and [place] locations is the same.  °[facility] [address], [place] (336) 299-0000   °[facility] [address] [place] [address], [place] or [address] (336) 852-7530 °(336) 878-2260   °[facility] [address], [place] (336) 350-1642, phone; (336) 294-5005, fax Sees patients 1st and 3rd Saturday of every month.  Must not qualify for public or private insurance (i.e. Medicaid, Medicare, [place] Health Choice, Veterans' Benefits) • Household income should be no more than 200% of the poverty level •The clinic cannot treat you if you are pregnant or think you are pregnant • Sexually transmitted diseases are not treated at the clinic.  ° ° °Dental Care: °Organization         Address  Phone  Notes  °[place] [facility] [address], [place] (336) 641-6152 Accepts children up to age 21 who are enrolled in Medicaid or [place] Health Choice; pregnant women with a Medicaid card; and children who have applied for Medicaid or [place] Health Choice, but were declined, whose parents can pay a reduced fee at time of service.  °[place]  [facility] [place]  [address], [place] (336) 641-7733 Accepts children up to age 21 who are enrolled in Medicaid or [place] Health Choice; pregnant women with a Medicaid card; and children who have applied for Medicaid or [place] Health Choice, but were declined, whose parents can pay a reduced fee at time of service.  °[facility] ° [address], [place] (336) 641-4533 Patients are seen by appointment only. Walk-ins are not accepted. [facility] will see patients 18 years of age and older. °Monday - Tuesday (8am-5pm) °Most Wednesdays (8:30-5pm) °$30 per visit, cash only  °[facility] ° [address], [place] (336) 641-4533 Patients are seen by appointment only. Walk-ins are not accepted. [facility] will see patients 18 years of age and older. °One   Wednesday Evening (Monthly: Volunteer Based).  $30 per visit, cash only  °[facility]  (919) 537-3737 for adults; Children under age 4, call Graduate Pediatric Dentistry at ([phone]) [phone]. Children aged 4-14, please call (919) [phone] to request a pediatric application. ° Dental services are provided in all areas of dental care including fillings, crowns and bridges, complete and partial dentures, implants, gum treatment, root canals, and extractions. Preventive care is also provided. Treatment is provided to both adults and children. °Patients are selected via a lottery and there is often a waiting list. °  °[facility] [address], °[place] ° (336) 763-8833 [email or web address] °  °[facility] [address], [place], [place] (336)723-1848, Ext. 123 Second and Fourth Thursday of each month, opens at 6:30 AM; Clinic ends at 9 AM.  Patients are seen on a first-come first-served basis, and a limited number are seen during each clinic.  ° °[facility] ° [address], [place], [place] (336) 723-7904    Eligibility Requirements °You must have lived in [place], [place], or [place] counties for at least the last three months. °  You cannot be eligible for state or federal sponsored healthcare insurance, including Veterans Administration, Medicaid, or Medicare. °  You generally cannot be eligible for healthcare insurance through your employer.  °  How to apply: °Eligibility screenings are held every Tuesday and Wednesday afternoon from 1:00 pm until 4:00 pm. You do not need an appointment for the interview!  °[facility] [address], [place], [place] 336-631-2330   °[facility]  336-342-8273   °[facility]  336-703-3100   °[facility]  336-570-6415   ° °Behavioral Health Resources in the Community: °Intensive Outpatient Programs °Organization         Address  Phone  Notes  °[facility] [address], [place], [place] 336-878-6098   °[facility] [address], [place], [place] 336-832-9800   °[facility] [address], [place], [place] ° 336-882-2125   °[facility] [address],  °[place], [place] 1-800-853-5163 or 336-641-4981   °Substance Abuse Resources °Organization         Address  Phone  Notes  °[facility]  336-882-2125   °[facility]  336-784-9470   °[facility]  336-285-9073   °[facility]  336-845-3988   °Residential & Outpatient Substance Abuse Program  1-800-659-3381   °Psychological Services °Organization         Address  Phone  Notes  °[facility]  336- 832-9600   °[facility]  336- 378-7881   °[facility] [address], [place] 1-800-853-5163 or 336-641-4981   ° °Mobile Crisis Teams °Organization         Address  Phone  Notes  °[facility], Mobile Crisis Care Unit  1-877-626-1772   °Assertive °[facility] ° [address]  [place], [place] 336-834-9664   °Sharon DeEsch [address] °[place] 336-554-5454   ° °Self-Help/Support Groups °Organization         Address  Phone             Notes  °[facility]  - variety of support groups  336- 373-1402 Call for more information  °Narcotics Anonymous (NA), [facility] [address], °[place]  2 meetings at this location  ° °  Residential Treatment Programs Organization         Address  Phone  Notes  [facility] [address],    [place]  [phone]   [facility]  [address], [place] [phone], [place], [place] [phone]   [facility] [address], [place] [phone] Admissions: 8am-3pm M-F  [facility] [address]    [place], [place] [phone]   [facility] [address], [place], [place] [phone]   [facility] [address]  [place], [place] [phone]([phone])[phone]   [facility] Intensive Outpatient [address], [place], [place] [phone]   [facility] ([facility] [address]  [place], [place] [phone] or ([phone]   [facility] [address] [place], [place] 323-557-3220(236) 808-9526 Accepts Medicaid  [facility] [address]  [place] [phone] Substance Abuse/Addiction Treatment   [facility]         Address  Phone  Notes  [facility]  304-203-5958(888) 930-177-0106   Angie FavaJulie Brannon, PhD [address] [place], [place]   [phone](336) (226)490-1031 or [phone](336) ([phone]   [facility]   [address] [place], [place] [phone](336) ([phone]   [facility] [address], [place], [place] (337)789-2865(336) [phone] Insurance/Medicaid/sponsorship through [facility] [address]                                    [place], [place] (336)347-8284(336) [phone] Therapy/tele-psych/case    [facility] [address]   [place], [place] [phone](336) [phone]    Dr. Lolly MustacheArfeen  [phone](336) [phone]   [facility]  [facility] 1) [address], [place] 2) [address], [place] 3)  [address], [place] [phone](336) [phone](336) [phone]  [phone](336) [phone]   [facility] Child Abuse Hotline 336-477-2696(336) 3853817589 or 216-280-5245(336) [phone] (After Hours)      Take the prescription as directed.  Apply moist heat or ice to the area(s) of discomfort, for 15 minutes at a time, several times per day for the next few days.  Do not fall asleep on a heating or ice pack.  Call your regular medical doctor today to schedule a follow up appointment in the next 2 days. Call your Urologist and the GI doctor to schedule a follow up appointment within the next week. Return to the Emergency Department immediately if worsening.

## 2014-11-20 NOTE — ED Notes (Signed)
Pt states that she has been having abdominal pain and right flank pain x 3 days with vomiting.  States that she saw some blood in vomit this morning.

## 2014-11-20 NOTE — ED Notes (Signed)
Computer in room not working. Service request submitted. Unable to scan medications.

## 2014-11-21 LAB — URINE CULTURE: Colony Count: 30000

## 2014-11-29 ENCOUNTER — Encounter (HOSPITAL_COMMUNITY): Payer: Self-pay

## 2014-11-29 ENCOUNTER — Inpatient Hospital Stay (HOSPITAL_COMMUNITY)
Admission: EM | Admit: 2014-11-29 | Discharge: 2014-12-03 | DRG: 690 | Disposition: A | Discharge status: Home / Self Care | Payer: Medicaid - Out of State | Attending: Internal Medicine | Admitting: Internal Medicine

## 2014-11-29 ENCOUNTER — Emergency Department (HOSPITAL_COMMUNITY): Payer: Medicaid - Out of State

## 2014-11-29 DIAGNOSIS — E876 Hypokalemia: Secondary | ICD-10-CM | POA: Diagnosis present

## 2014-11-29 DIAGNOSIS — Z23 Encounter for immunization: Secondary | ICD-10-CM | POA: Diagnosis not present

## 2014-11-29 DIAGNOSIS — R10A0 Flank pain, unspecified side: Secondary | ICD-10-CM | POA: Insufficient documentation

## 2014-11-29 DIAGNOSIS — R112 Nausea with vomiting, unspecified: Secondary | ICD-10-CM | POA: Diagnosis present

## 2014-11-29 DIAGNOSIS — R109 Unspecified abdominal pain: Secondary | ICD-10-CM

## 2014-11-29 DIAGNOSIS — N200 Calculus of kidney: Secondary | ICD-10-CM

## 2014-11-29 DIAGNOSIS — Z8639 Personal history of other endocrine, nutritional and metabolic disease: Secondary | ICD-10-CM

## 2014-11-29 DIAGNOSIS — E872 Acidosis: Secondary | ICD-10-CM | POA: Diagnosis present

## 2014-11-29 DIAGNOSIS — N182 Chronic kidney disease, stage 2 (mild): Secondary | ICD-10-CM | POA: Diagnosis present

## 2014-11-29 DIAGNOSIS — R339 Retention of urine, unspecified: Secondary | ICD-10-CM | POA: Diagnosis present

## 2014-11-29 DIAGNOSIS — N179 Acute kidney failure, unspecified: Secondary | ICD-10-CM | POA: Diagnosis present

## 2014-11-29 DIAGNOSIS — R1110 Vomiting, unspecified: Secondary | ICD-10-CM

## 2014-11-29 DIAGNOSIS — N10 Acute tubulo-interstitial nephritis: Secondary | ICD-10-CM | POA: Diagnosis present

## 2014-11-29 DIAGNOSIS — N12 Tubulo-interstitial nephritis, not specified as acute or chronic: Secondary | ICD-10-CM | POA: Diagnosis present

## 2014-11-29 DIAGNOSIS — R338 Other retention of urine: Secondary | ICD-10-CM

## 2014-11-29 DIAGNOSIS — Z87442 Personal history of urinary calculi: Secondary | ICD-10-CM

## 2014-11-29 DIAGNOSIS — N2589 Other disorders resulting from impaired renal tubular function: Secondary | ICD-10-CM | POA: Diagnosis present

## 2014-11-29 LAB — CBC WITH DIFFERENTIAL/PLATELET
Basophils Absolute: 0 10*3/uL (ref 0.0–0.1)
Basophils Relative: 0 % (ref 0–1)
Eosinophils Absolute: 0.1 10*3/uL (ref 0.0–0.7)
Eosinophils Relative: 0 % (ref 0–5)
HCT: 37.6 % (ref 36.0–46.0)
HEMOGLOBIN: 11.2 g/dL — AB (ref 12.0–15.0)
LYMPHS ABS: 1.2 10*3/uL (ref 0.7–4.0)
Lymphocytes Relative: 10 % — ABNORMAL LOW (ref 12–46)
MCH: 24.6 pg — AB (ref 26.0–34.0)
MCHC: 29.8 g/dL — ABNORMAL LOW (ref 30.0–36.0)
MCV: 82.5 fL (ref 78.0–100.0)
MONO ABS: 0.6 10*3/uL (ref 0.1–1.0)
MONOS PCT: 5 % (ref 3–12)
NEUTROS ABS: 10.6 10*3/uL — AB (ref 1.7–7.7)
NEUTROS PCT: 85 % — AB (ref 43–77)
Platelets: 294 10*3/uL (ref 150–400)
RBC: 4.56 MIL/uL (ref 3.87–5.11)
RDW: 18 % — ABNORMAL HIGH (ref 11.5–15.5)
WBC: 12.5 10*3/uL — AB (ref 4.0–10.5)

## 2014-11-29 LAB — URINALYSIS, ROUTINE W REFLEX MICROSCOPIC
Bilirubin Urine: NEGATIVE
Glucose, UA: NEGATIVE mg/dL
Ketones, ur: NEGATIVE mg/dL
Nitrite: NEGATIVE
PROTEIN: 30 mg/dL — AB
Specific Gravity, Urine: 1.01 (ref 1.005–1.030)
Urobilinogen, UA: 0.2 mg/dL (ref 0.0–1.0)
pH: 7 (ref 5.0–8.0)

## 2014-11-29 LAB — POTASSIUM: Potassium: 3.6 mmol/L (ref 3.5–5.1)

## 2014-11-29 LAB — URINE MICROSCOPIC-ADD ON

## 2014-11-29 LAB — BASIC METABOLIC PANEL
Anion gap: 8 (ref 5–15)
BUN: 13 mg/dL (ref 6–23)
CHLORIDE: 116 mmol/L — AB (ref 96–112)
CO2: 14 mmol/L — ABNORMAL LOW (ref 19–32)
Calcium: 8.5 mg/dL (ref 8.4–10.5)
Creatinine, Ser: 1.88 mg/dL — ABNORMAL HIGH (ref 0.50–1.10)
GFR, EST AFRICAN AMERICAN: 43 mL/min — AB (ref >90)
GFR, EST NON AFRICAN AMERICAN: 37 mL/min — AB (ref >90)
GLUCOSE: 101 mg/dL — AB (ref 70–99)
Potassium: 2 mmol/L — CL (ref 3.5–5.1)
SODIUM: 138 mmol/L (ref 135–145)

## 2014-11-29 LAB — PREGNANCY, URINE: PREG TEST UR: NEGATIVE

## 2014-11-29 LAB — MAGNESIUM: MAGNESIUM: 2.2 mg/dL (ref 1.5–2.5)

## 2014-11-29 MED ORDER — MORPHINE SULFATE 2 MG/ML IJ SOLN
2.0000 mg | INTRAMUSCULAR | Status: DC | PRN
  Administered 2014-11-29 – 2014-12-02 (×14): 2 mg via INTRAVENOUS
  Filled 2014-11-29 (×14): qty 1

## 2014-11-29 MED ORDER — POTASSIUM CHLORIDE CRYS ER 20 MEQ PO TBCR
40.0000 meq | EXTENDED_RELEASE_TABLET | Freq: Three times a day (TID) | ORAL | Status: AC
  Administered 2014-11-29 (×3): 40 meq via ORAL
  Filled 2014-11-29 (×3): qty 2

## 2014-11-29 MED ORDER — PROMETHAZINE HCL 12.5 MG PO TABS
25.0000 mg | ORAL_TABLET | Freq: Four times a day (QID) | ORAL | Status: DC | PRN
  Administered 2014-11-29 – 2014-12-02 (×7): 25 mg via ORAL
  Filled 2014-11-29 (×8): qty 2

## 2014-11-29 MED ORDER — DEXTROSE 5 % IV SOLN
1.0000 g | INTRAVENOUS | Status: DC
  Filled 2014-11-29 (×3): qty 10

## 2014-11-29 MED ORDER — PROMETHAZINE HCL 25 MG/ML IJ SOLN
25.0000 mg | Freq: Four times a day (QID) | INTRAMUSCULAR | Status: DC | PRN
  Administered 2014-12-01 – 2014-12-02 (×4): 25 mg via INTRAVENOUS
  Filled 2014-11-29 (×4): qty 1

## 2014-11-29 MED ORDER — MORPHINE SULFATE 4 MG/ML IJ SOLN
4.0000 mg | Freq: Once | INTRAMUSCULAR | Status: AC
Start: 2014-11-29 — End: 2014-11-29
  Administered 2014-11-29: 4 mg via INTRAVENOUS
  Filled 2014-11-29: qty 1

## 2014-11-29 MED ORDER — POLYETHYLENE GLYCOL 3350 17 G PO PACK
17.0000 g | PACK | Freq: Two times a day (BID) | ORAL | Status: DC
  Administered 2014-12-01: 17 g via ORAL
  Filled 2014-11-29 (×5): qty 1

## 2014-11-29 MED ORDER — HEPARIN SODIUM (PORCINE) 5000 UNIT/ML IJ SOLN
5000.0000 [IU] | Freq: Three times a day (TID) | INTRAMUSCULAR | Status: DC
  Administered 2014-11-29 – 2014-12-02 (×7): 5000 [IU] via SUBCUTANEOUS
  Filled 2014-11-29 (×9): qty 1

## 2014-11-29 MED ORDER — POTASSIUM CHLORIDE 20 MEQ/15ML (10%) PO SOLN
20.0000 meq | Freq: Once | ORAL | Status: AC
  Administered 2014-11-29: 20 meq via ORAL
  Filled 2014-11-29: qty 30

## 2014-11-29 MED ORDER — PROMETHAZINE HCL 25 MG/ML IJ SOLN
12.5000 mg | Freq: Once | INTRAMUSCULAR | Status: AC
  Administered 2014-11-29: 12.5 mg via INTRAVENOUS
  Filled 2014-11-29: qty 1

## 2014-11-29 MED ORDER — ONDANSETRON HCL 4 MG PO TABS
4.0000 mg | ORAL_TABLET | Freq: Four times a day (QID) | ORAL | Status: DC | PRN
  Administered 2014-11-29 – 2014-12-03 (×7): 4 mg via ORAL
  Filled 2014-11-29 (×7): qty 1

## 2014-11-29 MED ORDER — SODIUM CHLORIDE 0.9 % IV BOLUS (SEPSIS)
1000.0000 mL | Freq: Once | INTRAVENOUS | Status: AC
  Administered 2014-11-29: 1000 mL via INTRAVENOUS

## 2014-11-29 MED ORDER — POTASSIUM CHLORIDE 10 MEQ/100ML IV SOLN
10.0000 meq | INTRAVENOUS | Status: AC
  Administered 2014-11-29 (×6): 10 meq via INTRAVENOUS
  Filled 2014-11-29 (×6): qty 100

## 2014-11-29 MED ORDER — SODIUM BICARBONATE 650 MG PO TABS
650.0000 mg | ORAL_TABLET | Freq: Two times a day (BID) | ORAL | Status: DC
  Administered 2014-11-29 – 2014-12-03 (×9): 650 mg via ORAL
  Filled 2014-11-29 (×9): qty 1

## 2014-11-29 MED ORDER — GUAIFENESIN-DM 100-10 MG/5ML PO SYRP: 5.0000 mL | ORAL_SOLUTION | ORAL | Status: DC | PRN

## 2014-11-29 MED ORDER — HYDROCODONE-ACETAMINOPHEN 5-325 MG PO TABS
1.0000 | ORAL_TABLET | ORAL | Status: DC | PRN
  Administered 2014-11-29 – 2014-12-01 (×9): 2 via ORAL
  Administered 2014-12-02 (×2): 1 via ORAL
  Administered 2014-12-02: 2 via ORAL
  Filled 2014-11-29: qty 1
  Filled 2014-11-29 (×3): qty 2
  Filled 2014-11-29: qty 1
  Filled 2014-11-29: qty 2
  Filled 2014-11-29 (×2): qty 1
  Filled 2014-11-29 (×5): qty 2

## 2014-11-29 MED ORDER — SODIUM CHLORIDE 0.9 % IV SOLN
INTRAVENOUS | Status: DC
  Administered 2014-11-29 – 2014-11-30 (×3): via INTRAVENOUS

## 2014-11-29 MED ORDER — ONDANSETRON HCL 4 MG/2ML IJ SOLN
4.0000 mg | Freq: Four times a day (QID) | INTRAMUSCULAR | Status: DC | PRN
  Administered 2014-12-01 – 2014-12-03 (×3): 4 mg via INTRAVENOUS
  Filled 2014-11-29 (×3): qty 2

## 2014-11-29 MED ORDER — POTASSIUM CHLORIDE 10 MEQ/100ML IV SOLN
10.0000 meq | Freq: Once | INTRAVENOUS | Status: AC
  Administered 2014-11-29: 10 meq via INTRAVENOUS
  Filled 2014-11-29: qty 100

## 2014-11-29 MED ORDER — MORPHINE SULFATE 4 MG/ML IJ SOLN: 4.0000 mg | Freq: Once | INTRAMUSCULAR | Status: DC

## 2014-11-29 MED ORDER — DOCUSATE SODIUM 100 MG PO CAPS
100.0000 mg | ORAL_CAPSULE | Freq: Every day | ORAL | Status: DC | PRN
Start: 2014-11-29 — End: 2014-12-03

## 2014-11-29 MED ORDER — PANTOPRAZOLE SODIUM 40 MG PO TBEC
40.0000 mg | DELAYED_RELEASE_TABLET | Freq: Two times a day (BID) | ORAL | Status: DC
  Administered 2014-11-29 – 2014-12-03 (×9): 40 mg via ORAL
  Filled 2014-11-29 (×9): qty 1

## 2014-11-29 MED ORDER — INFLUENZA VAC SPLIT QUAD 0.5 ML IM SUSY
0.5000 mL | PREFILLED_SYRINGE | INTRAMUSCULAR | Status: AC
  Administered 2014-11-30: 0.5 mL via INTRAMUSCULAR
  Filled 2014-11-29: qty 0.5

## 2014-11-29 MED ORDER — ALUM & MAG HYDROXIDE-SIMETH 200-200-20 MG/5ML PO SUSP: 30.0000 mL | Freq: Four times a day (QID) | ORAL | Status: DC | PRN

## 2014-11-29 MED ORDER — DEXTROSE 5 % IV SOLN
1.0000 g | INTRAVENOUS | Status: DC
  Administered 2014-11-29 – 2014-12-02 (×4): 1 g via INTRAVENOUS
  Filled 2014-11-29 (×6): qty 10

## 2014-11-29 MED ORDER — MORPHINE SULFATE 4 MG/ML IJ SOLN
4.0000 mg | Freq: Once | INTRAMUSCULAR | Status: AC
  Administered 2014-11-29: 4 mg via INTRAVENOUS
  Filled 2014-11-29: qty 1

## 2014-11-29 NOTE — ED Notes (Signed)
hospitalist at bedside

## 2014-11-29 NOTE — ED Notes (Signed)
Ultrasound at bedside

## 2014-11-29 NOTE — ED Notes (Signed)
Pt escorted to restroom.

## 2014-11-29 NOTE — ED Notes (Signed)
MD at bedside. 

## 2014-11-29 NOTE — Care Management Utilization Note (Signed)
UR completed 

## 2014-11-29 NOTE — ED Notes (Signed)
Lab to obtain blood cultures prior to start of antibiotics. One set obtained, difficult stick.

## 2014-11-29 NOTE — ED Notes (Signed)
Pt complain of vomiting that started three days ago. States her right side hurts and she is aching all over

## 2014-11-29 NOTE — ED Notes (Signed)
CRITICAL VALUE ALERT  Critical value received:  Potassium 2.0  Date of notification:  03/04/016  Time of notification:  0942  Critical value read back:Yes.    Nurse who received alert:  Flint MelterSharley Darrel Baroni RN  MD notified (1st page):  Burgess AmorJulie Idol PA  Time of first page:  [phone]  MD notified (2nd page):  Time of second page:  Responding MD:  [phone]  Time MD responded:  J. Idol PA

## 2014-11-29 NOTE — ED Notes (Signed)
ED PA at bedside

## 2014-11-29 NOTE — H&P (Signed)
Patient Demographics  Chelsea [name], is a 24 y.o. female  MRN: [id number]   DOB - [birth year]  Admit Date - 11/29/2014  Outpatient Primary MD for the patient is ADDIS,DANIEL, DO   With History of -  Past Medical History  Diagnosis Date  . Rickets   . Constipation   . Ventral hernia   . Chronic flank pain   . Chronic abdominal pain   . Nausea and vomiting     chronic, recurrent  . Kidney stones       Past Surgical History  Procedure Laterality Date  . Ureteral stents    . Has only 1 functioning  kidney    . Colon surgery    . Appendectomy    . Leg surgery    . Extracorporeal shock wave lithotripsy Right 03/21/2013    Procedure: EXTRACORPOREAL SHOCK WAVE LITHOTRIPSY (ESWL) RIGHT URETERAL CALCULUS;  Surgeon: Ky BarbanMohammad I Javaid, MD;  Location: [facility];  Service: Urology;  Laterality: Right;  . Cystoscopy w/ ureteral stent placement Right 10/15/2013    Procedure: CYSTOSCOPY WITH RETROGRADE PYELOGRAM/URETERAL STENT PLACEMENT;  Surgeon: Ky BarbanMohammad I Javaid, MD;  Location: [facility];  Service: Urology;  Laterality: Right;    in for   Chief Complaint  Patient presents with  . Pain     HPI  Chelsea [name]  is a 24 y.o. female,  With H/O rickets, Kidney stones, Ur Stent placement in the past, CKD 2 baseline creatinine of 1.4, th Metabolic Acidosis, comes in with 2 week H/O dysuria with R Flank pain, gradually getting worse with chills, now developed N&V for the last 3 days, comes to the ER where workup suggestive of pyelonephritis, severe Hypokalemia, ARF, dehydration and I was called to admit the patient.   Right symptoms above patient is symptom-free, no chest pain, right-sided flank pain, some dysuria, no focal weakness. Some subjective fevers and chills at home.    Review of Systems    In addition to  the HPI above,   No Fever-chills, No Headache, No changes with Vision or hearing, No problems swallowing food or Liquids, No Chest pain, Cough or Shortness of Breath, No Abdominal pain, +ve Nausea & Vomiting and R flank pain, Bowel movements are regular, No Blood in stool or Urine, +ve dysuria, No new skin rashes or bruises, No new joints pains-aches,  No new weakness, tingling, numbness in any extremity, No recent weight gain or loss, No polyuria, polydypsia or polyphagia, No significant Mental Stressors.  A full 10 point Review of Systems was done, except as stated above, all other Review of Systems were negative.   Social History History  Substance Use Topics  . Smoking status: Never Smoker   . Smokeless tobacco: Not on file  . Alcohol Use: No      Family History Family History  Problem Relation Age of Onset  . Kidney disease Father       Prior to Admission  medications   Medication Sig Start Date End Date Taking? Authorizing Provider  acetaminophen (TYLENOL) 500 MG tablet Take 500-1,000 mg by mouth every 6 (six) hours as needed for headache.   Yes Historical Provider, MD  docusate sodium (COLACE) 100 MG capsule Take 100 mg by mouth daily as needed for mild constipation.    Yes Historical Provider, MD  ferrous sulfate (FERROUSUL) 325 (65 FE) MG tablet Take 325 mg by mouth at bedtime.   Yes Historical Provider, MD  magnesium hydroxide (MILK OF MAGNESIA) 400 MG/5ML suspension Take 30 mLs by mouth daily as needed for mild constipation.   Yes Historical Provider, MD  MAGNESIUM PO Take 1 tablet by mouth daily.   Yes Historical Provider, MD  Multiple Vitamin (MULTIVITAMIN WITH MINERALS) TABS tablet Take 1 tablet by mouth daily.   Yes Historical Provider, MD  pantoprazole (PROTONIX) 40 MG tablet Take 40 mg by mouth 2 (two) times daily.    Yes Historical Provider, MD  polyethylene glycol (MIRALAX / GLYCOLAX) packet Take 17 g by mouth 2 (two) times daily.   Yes Historical  Provider, MD  potassium citrate (UROCIT-K) 10 MEQ (1080 MG) SR tablet Take 20 mEq by mouth 3 (three) times daily with meals.   Yes Historical Provider, MD  promethazine (PHENERGAN) 25 MG tablet Take 1 tablet (25 mg total) by mouth every 6 (six) hours as needed for nausea or vomiting. 11/20/14  Yes Samuel Jester, DO  sodium bicarbonate 325 MG tablet Take 325 mg by mouth daily.   Yes Historical Provider, MD  HYDROcodone-acetaminophen (NORCO/VICODIN) 5-325 MG per tablet Take 1-2 tablets by mouth every 6 (six) hours as needed. Patient not taking: Reported on 11/20/2014 10/16/14   Vanetta Mulders, MD  ondansetron (ZOFRAN ODT) 4 MG disintegrating tablet Take 1 tablet (4 mg total) by mouth every 8 (eight) hours as needed. Patient not taking: Reported on 11/20/2014 10/16/14   Vanetta Mulders, MD  oxyCODONE-acetaminophen (PERCOCET/ROXICET) 5-325 MG per tablet Take 1 tablet by mouth every 4 (four) hours as needed. Patient not taking: Reported on 10/16/2014 08/24/14   Dione Booze, MD    No Known Allergies  Physical Exam  Vitals  Blood pressure 118/66, pulse 93, temperature 98 F (36.7 C), temperature source Oral, resp. rate 18, height  (1.473 m), weight 104.327 kg (230 lb), last menstrual period 11/21/2014, SpO2 100 %.   1. General young obese white female lying in bed in NAD,     2. Normal affect and insight, Not Suicidal or Homicidal, Awake Alert, Oriented X 3.  3. No F.N deficits, ALL C.Nerves Intact, Strength 5/5 all 4 extremities, Sensation intact all 4 extremities, Plantars down going.  4. Ears and Eyes appear Normal, Conjunctivae clear, PERRLA. Moist Oral Mucosa.  5. Supple Neck, No JVD, No cervical lymphadenopathy appriciated, No Carotid Bruits.  6. Symmetrical Chest wall movement, Good air movement bilaterally, CTAB.  7. RRR, No Gallops, Rubs or Murmurs, No Parasternal Heave.  8. Positive Bowel Sounds, Abdomen Soft, No tenderness, No organomegaly appriciated,No rebound -guarding  or rigidity. +ve R flank tenderness  9.  No Cyanosis, Normal Skin Turgor, No Skin Rash or Bruise.  10. Good muscle tone,  joints appear normal , no effusions, Normal ROM.  11. No Palpable Lymph Nodes in Neck or Axillae     Data Review  CBC  Recent Labs Lab 11/29/14 0905  WBC 12.5*  HGB 11.2*  HCT 37.6  PLT 294  MCV 82.5  MCH 24.6*  MCHC 29.8*  RDW 18.0*  LYMPHSABS 1.2  MONOABS 0.6  EOSABS 0.1  BASOSABS 0.0   ------------------------------------------------------------------------------------------------------------------  Chemistries   Recent Labs Lab 11/29/14 0905 11/29/14 0907  NA 138  --   K 2.0*  --   CL 116*  --   CO2 14*  --   GLUCOSE 101*  --   BUN 13  --   CREATININE 1.88*  --   CALCIUM 8.5  --   MG  --  2.2   ------------------------------------------------------------------------------------------------------------------ estimated creatinine clearance is 49.1 mL/min (by C-G formula based on Cr of 1.88). ------------------------------------------------------------------------------------------------------------------ No results for input(s): TSH, T4TOTAL, T3FREE, THYROIDAB in the last 72 hours.  Invalid input(s): FREET3   Coagulation profile No results for input(s): INR, PROTIME in the last 168 hours. ------------------------------------------------------------------------------------------------------------------- No results for input(s): DDIMER in the last 72 hours. -------------------------------------------------------------------------------------------------------------------  Cardiac Enzymes No results for input(s): CKMB, TROPONINI, MYOGLOBIN in the last 168 hours.  Invalid input(s): CK ------------------------------------------------------------------------------------------------------------------ Invalid input(s):  POCBNP   ---------------------------------------------------------------------------------------------------------------  Urinalysis    Component Value Date/Time   COLORURINE STRAW* 11/29/2014 0840   APPEARANCEUR CLEAR 11/29/2014 0840   LABSPEC 1.010 11/29/2014 0840   PHURINE 7.0 11/29/2014 0840   GLUCOSEU NEGATIVE 11/29/2014 0840   HGBUR MODERATE* 11/29/2014 0840   BILIRUBINUR NEGATIVE 11/29/2014 0840   KETONESUR NEGATIVE 11/29/2014 0840   PROTEINUR 30* 11/29/2014 0840   UROBILINOGEN 0.2 11/29/2014 0840   NITRITE NEGATIVE 11/29/2014 0840   LEUKOCYTESUR MODERATE* 11/29/2014 0840    ----------------------------------------------------------------------------------------------------------------  Imaging results:   No results found.       Assessment & Plan  1. Acute Pyelonephritis in a patient with history of renal stone. Recent CT scan done one week ago shows a nonobstructing calculi. She will be admitted to MedSurg bed, blood urine cultures obtained, IV Rocephin, IV fluids, obtain renal ultrasound to rule out obstruction. Monitor closely.   2. Severe hypokalemia due to nausea vomiting. Replace IV and by mouth, magnesium stable.   3. 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 No acute issues. Have increased bicarbonate dose which she takes at home.   4. History of rickets. No acute issues outpatient follow-up with PCP.   5. 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 Baseline creatinine 1.4. Acute renal failure due to #1 above. Hydrate and monitor.    DVT Prophylaxis Heparin   AM Labs Ordered, also please review Full Orders  Family Communication: Admission, patients condition and plan of care including tests being ordered have been discussed with the patient and indicates understanding and agree with the plan and Code Status.  Code Status Full  Likely DC to Home  Condition GUARDED     Time spent in minutes :35    SINGH,PRASHANT K M.D on  11/29/2014 at 11:12 AM  Between 7am to 7pm - Pager - [phone]  After 7pm go to [email or web address] - password [facility]  [facility]  Office  [phone]

## 2014-11-29 NOTE — ED Provider Notes (Signed)
CSN: [id number]     Arrival date & time 11/29/14  0805 History   First MD Initiated Contact with Patient 11/29/14 [phone]     Chief Complaint  Patient presents with  . Pain     (Consider location/radiation/quality/duration/timing/severity/associated sxs/prior Treatment) The history is provided by the patient.   Chelsea [name] is a 24 y.o. 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  She reports generalized weakness in her extremities, fatigue and dehydration. She reports almost falling today due to weakness in her legs.  She denies fevers or chills, dysuria and abdominal pain.  She has had hematuria without increased urinary frequency.  She was seen here on 2/24 for similar symptoms at which time her Ct renal study revealed a nonobstructing right renal stone.   Past Medical History  Diagnosis Date  . Rickets   . Constipation   . Ventral hernia   . Chronic flank pain   . Chronic abdominal pain   . Nausea and vomiting     chronic, recurrent  . Kidney stones    Past Surgical History  Procedure Laterality Date  . Ureteral stents    . Has only 1 functioning  kidney    . Colon surgery    . Appendectomy    . Leg surgery    . Extracorporeal shock wave lithotripsy Right 03/21/2013    Procedure: EXTRACORPOREAL SHOCK WAVE LITHOTRIPSY (ESWL) RIGHT URETERAL CALCULUS;  Surgeon: Ky Barban, MD;  Location: [facility];  Service: Urology;  Laterality: Right;  . Cystoscopy w/ ureteral stent placement Right 10/15/2013    Procedure: CYSTOSCOPY WITH RETROGRADE PYELOGRAM/URETERAL STENT PLACEMENT;  Surgeon: Ky Barban, MD;  Location: [facility];   Service: Urology;  Laterality: Right;   Family History  Problem Relation Age of Onset  . Kidney disease Father    History  Substance Use Topics  . Smoking status: Never Smoker   . Smokeless tobacco: Not on file  . Alcohol Use: No   OB History    Gravida Para Term Preterm AB TAB SAB Ectopic Multiple Living            0     Review of Systems  Constitutional: Positive for fatigue. Negative for fever and chills.  HENT: Negative for congestion and sore throat.   Eyes: Negative.   Respiratory: Negative for chest tightness and shortness of breath.   Cardiovascular: Negative for chest pain.  Gastrointestinal: Negative for nausea and abdominal pain.  Genitourinary: Positive for hematuria and flank pain. Negative for dysuria and frequency.  Musculoskeletal: Positive for arthralgias. Negative for joint swelling and neck pain.  Skin: Negative.  Negative for rash and wound.  Neurological: Positive for weakness. Negative for dizziness, light-headedness, numbness and headaches.  Psychiatric/Behavioral: Negative.       Allergies  Review of patient's allergies indicates no known allergies.  Home Medications   Prior to Admission medications   Medication Sig Start Date End Date Taking? Authorizing Provider  acetaminophen (TYLENOL) 500 MG tablet Take 500-1,000 mg by mouth every 6 (six) hours as needed for headache.   Yes Historical Provider, MD  docusate sodium (COLACE) 100 MG capsule Take 100 mg by mouth daily as needed  for mild constipation.    Yes Historical Provider, MD  ferrous sulfate (FERROUSUL) 325 (65 FE) MG tablet Take 325 mg by mouth at bedtime.   Yes Historical Provider, MD  magnesium hydroxide (MILK OF MAGNESIA) 400 MG/5ML suspension Take 30 mLs by mouth daily as needed for mild constipation.   Yes Historical Provider, MD  MAGNESIUM PO Take 1 tablet by mouth daily.   Yes Historical Provider, MD  Multiple Vitamin (MULTIVITAMIN WITH MINERALS) TABS tablet Take 1 tablet by mouth  daily.   Yes Historical Provider, MD  pantoprazole (PROTONIX) 40 MG tablet Take 40 mg by mouth 2 (two) times daily.    Yes Historical Provider, MD  polyethylene glycol (MIRALAX / GLYCOLAX) packet Take 17 g by mouth 2 (two) times daily.   Yes Historical Provider, MD  potassium citrate (UROCIT-K) 10 MEQ (1080 MG) SR tablet Take 20 mEq by mouth 3 (three) times daily with meals.   Yes Historical Provider, MD  promethazine (PHENERGAN) 25 MG tablet Take 1 tablet (25 mg total) by mouth every 6 (six) hours as needed for nausea or vomiting. 11/20/14  Yes Samuel Jester, DO  sodium bicarbonate 325 MG tablet Take 325 mg by mouth daily.   Yes Historical Provider, MD  HYDROcodone-acetaminophen (NORCO/VICODIN) 5-325 MG per tablet Take 1-2 tablets by mouth every 6 (six) hours as needed. Patient not taking: Reported on 11/20/2014 10/16/14   Vanetta Mulders, MD  ondansetron (ZOFRAN ODT) 4 MG disintegrating tablet Take 1 tablet (4 mg total) by mouth every 8 (eight) hours as needed. Patient not taking: Reported on 11/20/2014 10/16/14   Vanetta Mulders, MD  oxyCODONE-acetaminophen (PERCOCET/ROXICET) 5-325 MG per tablet Take 1 tablet by mouth every 4 (four) hours as needed. Patient not taking: Reported on 10/16/2014 08/24/14   Dione Booze, MD   BP 118/66 mmHg  Pulse 93  Temp(Src) 98 F (36.7 C) (Oral)  Resp 18  Ht  (1.473 m)  Wt 230 lb (104.327 kg)  BMI 48.08 kg/m2  SpO2 100%  LMP 11/21/2014 Physical Exam  Constitutional: She appears well-developed and well-nourished.  HENT:  Head: Normocephalic and atraumatic.  Eyes: Conjunctivae are normal.  Neck: Normal range of motion.  Cardiovascular: Normal rate, regular rhythm, normal heart sounds and intact distal pulses.   Pulses:      Dorsalis pedis pulses are 2+ on the right side, and 2+ on the left side.  Pulmonary/Chest: Effort normal and breath sounds normal. She has no wheezes.  Abdominal: Soft. Bowel sounds are normal. There is no tenderness. There is  CVA tenderness.  cva ttp right.  Musculoskeletal:  Post surgical well healed incisions bilateral knees. No calf edema.  Non tender.  Neurological: She is alert.  Skin: Skin is warm and dry.  Psychiatric: She has a normal mood and affect.  Nursing note and vitals reviewed.   ED Course  Procedures (including critical care time) Labs Review Labs Reviewed  URINALYSIS, ROUTINE W REFLEX MICROSCOPIC - Abnormal; Notable for the following:    Color, Urine STRAW (*)    Hgb urine dipstick MODERATE (*)    Protein, ur 30 (*)    Leukocytes, UA MODERATE (*)    All other components within normal limits  BASIC METABOLIC PANEL - Abnormal; Notable for the following:    Potassium 2.0 (*)    Chloride 116 (*)    CO2 14 (*)    Glucose, Bld 101 (*)    Creatinine, Ser 1.88 (*)    GFR calc non Af Amer 37 (*)  GFR calc Af Amer 43 (*)    All other components within normal limits  CBC WITH DIFFERENTIAL/PLATELET - Abnormal; Notable for the following:    WBC 12.5 (*)    Hemoglobin 11.2 (*)    MCH 24.6 (*)    MCHC 29.8 (*)    RDW 18.0 (*)    Neutrophils Relative % 85 (*)    Neutro Abs 10.6 (*)    Lymphocytes Relative 10 (*)    All other components within normal limits  URINE MICROSCOPIC-ADD ON - Abnormal; Notable for the following:    Squamous Epithelial / LPF MANY (*)    Bacteria, UA FEW (*)    All other components within normal limits  URINE CULTURE  CULTURE, BLOOD (ROUTINE X 2)  CULTURE, BLOOD (ROUTINE X 2)  PREGNANCY, URINE  MAGNESIUM    Imaging Review No results found.   EKG Interpretation   Date/Time:  Friday November 29 2014 09:47:58 EST Ventricular Rate:  81 PR Interval:  165 QRS Duration: 97 QT Interval:  356 QTC Calculation: 413 R Axis:   82 Text Interpretation:  Sinus rhythm Borderline T abnormalities, anterior  leads Confirmed by COOK  MD, BRIAN ([id number]) on 11/29/2014 10:08:36 AM      MDM   Final diagnoses:  Hypokalemia  Acute renal injury  Flank pain  Renal stone     Patients labs and/or radiological studies were reviewed and considered during the medical decision making and disposition process.  Results were also discussed with patient.  Pt seen by Dr. Adriana Simasook during this visit. Pt with profound hypokalemia and worsened renal insufficiency without dehydration per labs, but by hx.  She was given IV potassium along with oral.  IV fluids.  Urine not a clean catch specimen, positive wbc's, nitrite neg, few bacteria, but no dysuria,  urine culture ordered.  CT renal study completed 11/20/13 reviewed with chronic right renal stone.    Spoke with Dr. Thedore MinsSingh with [facility].  Requests urology consult Ct renal vs US. May need to consider cath UA for non contaminated specimen.  Blood cultures collected.  Rocephin per Dr. Thedore MinsSingh . Urine cx pending at this time.  Urine cx from previous visit 2/24 negative for uti.   Discussed case with Dr. Vernie Ammonsttelin. Advised renal US which would be adequate to assess for obstruction and pyelonephritis.  Call back from Dr. Thedore MinsSingh who will see pt in ed.  Burgess AmorJulie Mesha Schamberger, PA-C 11/29/14 1101  Burgess AmorJulie Tanielle Emigh, PA-C 11/29/14 1145  Donnetta HutchingBrian Cook, MD 11/29/14 1359

## 2014-11-30 LAB — BASIC METABOLIC PANEL
Anion gap: 5 (ref 5–15)
BUN: 13 mg/dL (ref 6–23)
CHLORIDE: 122 mmol/L — AB (ref 96–112)
CO2: 14 mmol/L — AB (ref 19–32)
CREATININE: 1.52 mg/dL — AB (ref 0.50–1.10)
Calcium: 7.6 mg/dL — ABNORMAL LOW (ref 8.4–10.5)
GFR calc non Af Amer: 48 mL/min — ABNORMAL LOW (ref >90)
GFR, EST AFRICAN AMERICAN: 55 mL/min — AB (ref >90)
Glucose, Bld: 76 mg/dL (ref 70–99)
Potassium: 3.4 mmol/L — ABNORMAL LOW (ref 3.5–5.1)
SODIUM: 141 mmol/L (ref 135–145)

## 2014-11-30 LAB — CBC
HEMATOCRIT: 31.9 % — AB (ref 36.0–46.0)
Hemoglobin: 9.1 g/dL — ABNORMAL LOW (ref 12.0–15.0)
MCH: 23.9 pg — ABNORMAL LOW (ref 26.0–34.0)
MCHC: 28.5 g/dL — ABNORMAL LOW (ref 30.0–36.0)
MCV: 83.7 fL (ref 78.0–100.0)
PLATELETS: 230 10*3/uL (ref 150–400)
RBC: 3.81 MIL/uL — AB (ref 3.87–5.11)
RDW: 18.3 % — ABNORMAL HIGH (ref 11.5–15.5)
WBC: 8.8 10*3/uL (ref 4.0–10.5)

## 2014-11-30 MED ORDER — POTASSIUM CHLORIDE CRYS ER 20 MEQ PO TBCR
40.0000 meq | EXTENDED_RELEASE_TABLET | ORAL | Status: AC
  Administered 2014-11-30 (×2): 40 meq via ORAL
  Filled 2014-11-30 (×2): qty 2

## 2014-11-30 MED ORDER — SODIUM CHLORIDE 0.9 % IV SOLN: INTRAVENOUS | Status: DC

## 2014-11-30 MED ORDER — MORPHINE SULFATE 4 MG/ML IJ SOLN
4.0000 mg | Freq: Once | INTRAMUSCULAR | Status: AC
  Administered 2014-11-30: 4 mg via INTRAVENOUS
  Filled 2014-11-30: qty 1

## 2014-11-30 MED ORDER — LACTATED RINGERS IV SOLN
INTRAVENOUS | Status: AC
  Administered 2014-11-30 – 2014-12-01 (×2): via INTRAVENOUS

## 2014-11-30 NOTE — Progress Notes (Signed)
Patient Demographics  Chelsea [name], is a 24 y.o. female, DOB - 28-Apr-1991, VHQ:[id number]  Admit date - 11/29/2014   Admitting Physician Leroy Sea, MD  Outpatient Primary MD for the patient is ADDIS,DANIEL, DO  LOS - 1   Chief Complaint  Patient presents with  . Pain        Subjective:    Chelsea [name] today has, No headache, No chest pain, No abdominal pain, No new weakness tingling or numbness, No Cough - SOB.  Still has mild right-sided flank pain and some nausea without emesis. But appears comfortable.   Assessment & Plan    1. Acute Pyelonephritis in a patient with history of renal stone. Recent CT scan done one week ago shows a nonobstructing calculi. He improved on empiric IV Rocephin, blood urine cultures obtained and are pending, stable renal ultrasound - ruled out obstruction. IV antibiotics for 1-2 more days then place on oral Ceftin for another 10 to complete 14 days total.    2. Severe hypokalemia due to nausea vomiting. Replaced IV and by mouth, magnesium stable. BMP in am.    3. 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 Baseline Bicarb is around 15, No acute issues. Have increased bicarbonate dose which she takes at home.    4. History of rickets. No acute issues outpatient follow-up with PCP.     5. 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 Baseline creatinine 1.4. Acute renal failure due to #1 above. Improving with IVF, will switch to lactated Ringer as chloride is slightly high.      Code Status: Full  Family Communication: None present  Disposition Plan: Home likely in 1-2 days   Procedures   Renal ultrasound. Nonacute   Consults      Medications  Scheduled Meds: . cefTRIAXone (ROCEPHIN)  IV  1 g Intravenous Q24H  .  heparin  5,000 Units Subcutaneous 3 times per day  . Influenza vac split quadrivalent PF  0.5 mL Intramuscular Tomorrow-1000  . pantoprazole  40 mg Oral BID  . polyethylene glycol  17 g Oral BID  . potassium chloride  40 mEq Oral Q4H  . sodium bicarbonate  650 mg Oral BID   Continuous Infusions: . lactated ringers     PRN Meds:.alum & mag hydroxide-simeth, docusate sodium, guaiFENesin-dextromethorphan, HYDROcodone-acetaminophen, morphine injection, ondansetron **OR** ondansetron (ZOFRAN) IV, promethazine **OR** promethazine  DVT Prophylaxis   Heparin    Lab Results  Component Value Date   PLT 230 11/30/2014    Antibiotics     Anti-infectives    Start     Dose/Rate Route Frequency Ordered Stop   11/29/14 1400  cefTRIAXone (ROCEPHIN) 1 g in dextrose 5 % 50 mL IVPB     1 g 100 mL/hr over 30 Minutes Intravenous Every 24 hours 11/29/14 1228     11/29/14 1030  cefTRIAXone (ROCEPHIN) 1 g in dextrose 5 % 50 mL IVPB  Status:  Discontinued     1 g 100 mL/hr over 30 Minutes Intravenous Every 24 hours 11/29/14 1026 11/29/14 1228          Objective:   Filed Vitals:   11/29/14 0832 11/29/14 1232 11/29/14 2039 11/30/14 0535  BP: 118/66 123/63 111/62 116/59  Pulse:  93 87 79 82  Temp: 98 F (36.7 C) 98.6 F (37 C) 97.7 F (36.5 C) 97.6 F (36.4 C)  TempSrc: Oral Oral Oral Oral  Resp: Height:  (1.473 m)  (1.473 m)    Weight: 104.327 kg (230 lb) 105.008 kg (231 lb 8 oz)  104.6 kg (230 lb 9.6 oz)  SpO2: 100% 100% 99% 100%    Wt Readings from Last 3 Encounters:  11/30/14 104.6 kg (230 lb 9.6 oz)  11/20/14 104.327 kg (230 lb)  10/16/14 106.595 kg (235 lb)     Intake/Output Summary (Last 24 hours) at 11/30/14 0806 Last data filed at 11/30/14 8657  Gross per 24 hour  Intake   2780 ml  Output      2 ml  Net   2778 ml     Physical Exam  Awake Alert, Oriented X 3, No new F.N deficits, Normal affect [place].AT,PERRAL Supple Neck,No JVD, No cervical  lymphadenopathy appriciated.  Symmetrical Chest wall movement, Good air movement bilaterally, CTAB RRR,No Gallops,Rubs or new Murmurs, No Parasternal Heave +ve B.Sounds, Abd Soft, No tenderness, No organomegaly appriciated, No rebound - guarding or rigidity. Mild right-sided flank pain No Cyanosis, Clubbing or edema, No new Rash or bruise      Data Review   Micro Results No results found for this or any previous visit (from the past 240 hour(s)).  Radiology Reports US Renal  11/29/2014   CLINICAL DATA:  One week history of right flank pain with nausea and vomiting  EXAM: RENAL/URINARY TRACT ULTRASOUND COMPLETE  COMPARISON:  CT abdomen and pelvis November 20, 2014  FINDINGS: Right Kidney:  Length: 13.2 cm. Increased echogenicity throughout the medullary portion the kidney is consistent with nephrocalcinosis. There is renal cortical thinning. The echogenicity of the kidney is normal. There is no well-defined mass or hydronephrosis. No perinephric fluid. No sonographically demonstrable ureterectasis.  Left Kidney:  Length: 8.4 cm. Left kidney is diffusely atrophic with nephrocalcinosis. The echogenicity of the left kidney is normal. No mass or hydronephrosis. No perinephric fluid. No ureterectasis.  Bladder:  Appears normal for degree of bladder distention.  IMPRESSION: Atrophic left kidney. Bilateral nephrocalcinosis. No obstructing foci in either kidney. No masses are seen. No perinephric fluid on either side. Renal cortical thinning bilaterally with normal renal echogenicity bilaterally.   Electronically Signed   By: Bretta Bang III M.D.   On: 11/29/2014 11:26   Ct Renal Stone Study  11/20/2014   CLINICAL DATA:  24 year old female with right flank pain for 4 weeks and microscopic hematuria. Initial encounter. Nephrolithiasis history.  EXAM: CT ABDOMEN AND PELVIS WITHOUT CONTRAST  TECHNIQUE: Multidetector CT imaging of the abdomen and pelvis was performed following the standard protocol without  IV contrast.  COMPARISON:  CT Abdomen and Pelvis 10/16/2014 and earlier.  FINDINGS: Large body habitus.  Stable and negative lung bases. Late subacute to chronic right posterior eleventh rib fracture with callus. No acute osseous abnormality identified.  No pelvic free fluid. Negative non contrast uterus and adnexa. Decompressed rectum. Mildly redundant sigmoid colon with occasional diverticula but no active inflammation. Negative left colon.  Several broad-based ventral abdominal bowel and mesenteric containing hernias are re - identified, appear stable in size and configuration, and do not appear incarcerated. Both large and small bowel loops are involved. Retained stool in the proximal colon. There is a right colon anastomosis re- identified. Terminal ileum within normal limits. No dilated or inflamed small bowel. Negative stomach and  duodenum.  Noncontrast liver, gallbladder, spleen, pancreas and adrenal glands are within normal limits. No abdominal free fluid.  Severe left renal atrophy again noted. Bilateral nephrocalcinosis re- identified. The right double-J ureteral stent is been removed. No hydronephrosis. No perinephric stranding. Confluent amorphous calcification in the right lower pole renal collecting system is stable. No right hydroureter. Minimal residual right periureteral stranding. Diminutive left ureter. Unremarkable bladder. No lymphadenopathy.  IMPRESSION: 1. Interval removed right double-J ureteral stent with no acute obstructive uropathy. 2. Chronic severe left renal atrophy. Extensive bilateral nephrocalcinosis. Bulky right lower pole nephrolithiasis appears stable. 3. Multiple non incarcerated appearing ventral abdominal hernias are stable. No bowel obstruction or focal bowel inflammation. Ascending colon anastomosis. 4. Late subacute to early chronic posterior right eleventh rib fracture.   Electronically Signed   By: Odessa FlemingH  Hall M.D.   On: 11/20/2014 08:40     CBC  Recent Labs Lab  11/29/14 0905 11/30/14 0548  WBC 12.5* 8.8  HGB 11.2* 9.1*  HCT 37.6 31.9*  PLT 294 230  MCV 82.5 83.7  MCH 24.6* 23.9*  MCHC 29.8* 28.5*  RDW 18.0* 18.3*  LYMPHSABS 1.2  --   MONOABS 0.6  --   EOSABS 0.1  --   BASOSABS 0.0  --     Chemistries   Recent Labs Lab 11/29/14 0905 11/29/14 0907 11/29/14 2211 11/30/14 0548  NA 138  --   --  141  K 2.0*  --  3.6 3.4*  CL 116*  --   --  122*  CO2 14*  --   --  14*  GLUCOSE 101*  --   --  76  BUN 13  --   --  13  CREATININE 1.88*  --   --  1.52*  CALCIUM 8.5  --   --  7.6*  MG  --  2.2  --   --    ------------------------------------------------------------------------------------------------------------------ estimated creatinine clearance is 60.9 mL/min (by C-G formula based on Cr of 1.52). ------------------------------------------------------------------------------------------------------------------ No results for input(s): HGBA1C in the last 72 hours. ------------------------------------------------------------------------------------------------------------------ No results for input(s): CHOL, HDL, LDLCALC, TRIG, CHOLHDL, LDLDIRECT in the last 72 hours. ------------------------------------------------------------------------------------------------------------------ No results for input(s): TSH, T4TOTAL, T3FREE, THYROIDAB in the last 72 hours.  Invalid input(s): FREET3 ------------------------------------------------------------------------------------------------------------------ No results for input(s): VITAMINB12, FOLATE, FERRITIN, TIBC, IRON, RETICCTPCT in the last 72 hours.  Coagulation profile No results for input(s): INR, PROTIME in the last 168 hours.  No results for input(s): DDIMER in the last 72 hours.  Cardiac Enzymes No results for input(s): CKMB, TROPONINI, MYOGLOBIN in the last 168 hours.  Invalid input(s):  CK ------------------------------------------------------------------------------------------------------------------ Invalid input(s): POCBNP     Time Spent in minutes   35   Babara Buffalo K M.D on 11/30/2014 at 8:06 AM  Between 7am to 7pm - Pager - [phone]  After 7pm go to [email or web address] - password [email or web address]  [facility]   Office  [phone]

## 2014-11-30 NOTE — Progress Notes (Signed)
Patient is complaining of "having to urinate constantly and not voiding a lot." Bladder scan done with 344 ml noted on scanner, Dr. Thedore MinsSingh notified, orders to do I&O cath.

## 2014-11-30 NOTE — Progress Notes (Signed)
Patient requested pain medicine to be increased or changed due to the Morphine is not relieving her pain.  At present, patient resting comfortably, no facial grimacing, no guarding, watching television.  Dr. Thedore MinsSingh notified, page returned and MD says he will will medicine as ordered, patient notified.

## 2014-12-01 DIAGNOSIS — R338 Other retention of urine: Secondary | ICD-10-CM

## 2014-12-01 LAB — BASIC METABOLIC PANEL
Anion gap: 3 — ABNORMAL LOW (ref 5–15)
BUN: 11 mg/dL (ref 6–23)
CALCIUM: 7.8 mg/dL — AB (ref 8.4–10.5)
CO2: 16 mmol/L — ABNORMAL LOW (ref 19–32)
CREATININE: 1.38 mg/dL — AB (ref 0.50–1.10)
Chloride: 122 mmol/L — ABNORMAL HIGH (ref 96–112)
GFR calc non Af Amer: 54 mL/min — ABNORMAL LOW (ref >90)
GFR, EST AFRICAN AMERICAN: 62 mL/min — AB (ref >90)
Glucose, Bld: 82 mg/dL (ref 70–99)
Potassium: 3.3 mmol/L — ABNORMAL LOW (ref 3.5–5.1)
Sodium: 141 mmol/L (ref 135–145)

## 2014-12-01 MED ORDER — POTASSIUM CHLORIDE CRYS ER 20 MEQ PO TBCR
40.0000 meq | EXTENDED_RELEASE_TABLET | ORAL | Status: AC
  Administered 2014-12-01 (×2): 40 meq via ORAL
  Filled 2014-12-01 (×2): qty 2

## 2014-12-01 NOTE — Progress Notes (Signed)
Refused SCD's stating "I have been here 3 days and haven't needed them and I m,ove my legs a lot."

## 2014-12-01 NOTE — Progress Notes (Signed)
Refused heparin as VTE at 2200, stated she would take 0600 dose.  Explained need of Heparin to prevent blood clots but patient continued to refuse.  Cath secure not in place, patient refused, explained need of use, patient continued to refuse.

## 2014-12-01 NOTE — Progress Notes (Signed)
Patient Demographics  Chelsea [name], is a 24 y.o. female, DOB - [birth year], WUJ:[id number]  Admit date - 11/29/2014   Admitting Physician Leroy Sea, MD  Outpatient Primary MD for the patient is ADDIS,DANIEL, DO  LOS - 2   Chief Complaint  Patient presents with  . Pain        Subjective:    Chelsea [name] today has, No headache, No chest pain, No abdominal pain, No new weakness tingling or numbness, No Cough - SOB.  Still has continued right-sided flank pain and some nausea without emesis. Also c/o difficulty urinating. Bladder scan with >999 ml.   Assessment & Plan    1. Acute Pyelonephritis in a patient with history of renal stone. Recent CT scan done one week ago shows a nonobstructing calculi. Has improved on empiric IV Rocephin, blood urine cultures obtained and are pending, stable renal ultrasound - ruled out obstruction. IV antibiotics for 1-2 more days then place on oral Ceftin for another 10 to complete 14 days total.    2. Severe hypokalemia due to nausea vomiting. K 3.3 today. Continue to replace. Mg ok at 2.2.    3. 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 Baseline Bicarb is around 15, No acute issues.    4. History of rickets. No acute issues outpatient follow-up with PCP.     5. 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 Baseline creatinine 1.4. Acute renal failure due to #1 above. Cr is back to baseline today at 1.38.   6. Acute Urinary Retention. Had I and O cath yesterday. Inability to urinate again today and bladder scan with >999 ml. Will place foley and request GU consult. If patient stable for DC prior to Tuesday, can leave with foley and follow with GU as an OP.      Code Status: Full  Family Communication: None  present  Disposition Plan: Home likely in 1-2 days   Procedures   Renal ultrasound. Nonacute   Consults      Medications  Scheduled Meds: . cefTRIAXone (ROCEPHIN)  IV  1 g Intravenous Q24H  . heparin  5,000 Units Subcutaneous 3 times per day  . pantoprazole  40 mg Oral BID  . polyethylene glycol  17 g Oral BID  . sodium bicarbonate  650 mg Oral BID   Continuous Infusions:   PRN Meds:.alum & mag hydroxide-simeth, docusate sodium, guaiFENesin-dextromethorphan, HYDROcodone-acetaminophen, morphine injection, ondansetron **OR** ondansetron (ZOFRAN) IV, promethazine **OR** promethazine  DVT Prophylaxis   Heparin    Lab Results  Component Value Date   PLT 230 11/30/2014    Antibiotics     Anti-infectives    Start     Dose/Rate Route Frequency Ordered Stop   11/29/14 1400  cefTRIAXone (ROCEPHIN) 1 g in dextrose 5 % 50 mL IVPB     1 g 100 mL/hr over 30 Minutes Intravenous Every 24 hours 11/29/14 1228     11/29/14 1030  cefTRIAXone (ROCEPHIN) 1 g in dextrose 5 % 50 mL IVPB  Status:  Discontinued     1 g 100 mL/hr over 30 Minutes Intravenous Every 24 hours 11/29/14 1026 11/29/14 1228          Objective:   Filed Vitals:   11/30/14  [id number] 11/30/14 1519 11/30/14 2105 12/01/14 0548  BP: 116/59 110/55 93/48 104/60  Pulse: 82 79 80 84  Temp: 97.6 F (36.4 C) 98.1 F (36.7 C) 98 F (36.7 C) 97.7 F (36.5 C)  TempSrc: Oral Oral Oral Oral  Resp: 20 20 20 20   Height:      Weight: 104.6 kg (230 lb 9.6 oz)   105.688 kg (233 lb)  SpO2: 100% 99% 96% 100%    Wt Readings from Last 3 Encounters:  12/01/14 105.688 kg (233 lb)  11/20/14 104.327 kg (230 lb)  10/16/14 106.595 kg (235 lb)     Intake/Output Summary (Last 24 hours) at 12/01/14 1236 Last data filed at 12/01/14 0900  Gross per 24 hour  Intake 2424.17 ml  Output    550 ml  Net 1874.17 ml     Physical Exam  Awake Alert, Oriented X 3, No new F.N deficits, Normal affect [place].AT,PERRAL Supple Neck,No JVD, No  cervical lymphadenopathy appriciated.  Symmetrical Chest wall movement, Good air movement bilaterally, CTAB RRR,No Gallops,Rubs or new Murmurs, No Parasternal Heave +ve B.Sounds, Abd Soft, No tenderness, No organomegaly appriciated, No rebound - guarding or rigidity. Mild right-sided flank pain No Cyanosis, Clubbing or edema, No new Rash or bruise      Data Review   Micro Results Recent Results (from the past 240 hour(s))  Blood culture (routine x 2)     Status: None (Preliminary result)   Collection Time: 11/29/14 10:29 AM  Result Value Ref Range Status   Specimen Description LEFT ANTECUBITAL  Final   Special Requests BOTTLES DRAWN AEROBIC AND ANAEROBIC 6CC  Final   Culture NO GROWTH 2 DAYS  Final   Report Status PENDING  Incomplete  Blood culture (routine x 2)     Status: None (Preliminary result)   Collection Time: 11/29/14 10:57 AM  Result Value Ref Range Status   Specimen Description LEFT ANTECUBITAL  Final   Special Requests BOTTLES DRAWN AEROBIC ONLY 6CC  Final   Culture NO GROWTH 2 DAYS  Final   Report Status PENDING  Incomplete    Radiology Reports [place] Renal  11/29/2014   CLINICAL DATA:  One week history of right flank pain with nausea and vomiting  EXAM: RENAL/URINARY TRACT ULTRASOUND COMPLETE  COMPARISON:  CT abdomen and pelvis November 20, 2014  FINDINGS: Right Kidney:  Length: 13.2 cm. Increased echogenicity throughout the medullary portion the kidney is consistent with nephrocalcinosis. There is renal cortical thinning. The echogenicity of the kidney is normal. There is no well-defined mass or hydronephrosis. No perinephric fluid. No sonographically demonstrable ureterectasis.  Left Kidney:  Length: 8.4 cm. Left kidney is diffusely atrophic with nephrocalcinosis. The echogenicity of the left kidney is normal. No mass or hydronephrosis. No perinephric fluid. No ureterectasis.  Bladder:  Appears normal for degree of bladder distention.  IMPRESSION: Atrophic left kidney.  Bilateral nephrocalcinosis. No obstructing foci in either kidney. No masses are seen. No perinephric fluid on either side. Renal cortical thinning bilaterally with normal renal echogenicity bilaterally.   Electronically Signed   By: Bretta BangWilliam  Woodruff III M.D.   On: 11/29/2014 11:26   Ct Renal Stone Study  11/20/2014   CLINICAL DATA:  24 year old female with right flank pain for 4 weeks and microscopic hematuria. Initial encounter. Nephrolithiasis history.  EXAM: CT ABDOMEN AND PELVIS WITHOUT CONTRAST  TECHNIQUE: Multidetector CT imaging of the abdomen and pelvis was performed following the standard protocol without IV contrast.  COMPARISON:  CT Abdomen and Pelvis 10/16/2014 and  earlier.  FINDINGS: Large body habitus.  Stable and negative lung bases. Late subacute to chronic right posterior eleventh rib fracture with callus. No acute osseous abnormality identified.  No pelvic free fluid. Negative non contrast uterus and adnexa. Decompressed rectum. Mildly redundant sigmoid colon with occasional diverticula but no active inflammation. Negative left colon.  Several broad-based ventral abdominal bowel and mesenteric containing hernias are re - identified, appear stable in size and configuration, and do not appear incarcerated. Both large and small bowel loops are involved. Retained stool in the proximal colon. There is a right colon anastomosis re- identified. Terminal ileum within normal limits. No dilated or inflamed small bowel. Negative stomach and duodenum.  Noncontrast liver, gallbladder, spleen, pancreas and adrenal glands are within normal limits. No abdominal free fluid.  Severe left renal atrophy again noted. Bilateral nephrocalcinosis re- identified. The right double-J ureteral stent is been removed. No hydronephrosis. No perinephric stranding. Confluent amorphous calcification in the right lower pole renal collecting system is stable. No right hydroureter. Minimal residual right periureteral stranding.  Diminutive left ureter. Unremarkable bladder. No lymphadenopathy.  IMPRESSION: 1. Interval removed right double-J ureteral stent with no acute obstructive uropathy. 2. Chronic severe left renal atrophy. Extensive bilateral nephrocalcinosis. Bulky right lower pole nephrolithiasis appears stable. 3. Multiple non incarcerated appearing ventral abdominal hernias are stable. No bowel obstruction or focal bowel inflammation. Ascending colon anastomosis. 4. Late subacute to early chronic posterior right eleventh rib fracture.   Electronically Signed   By: Odessa Fleming M.D.   On: 11/20/2014 08:40     CBC  Recent Labs Lab 11/29/14 0905 11/30/14 0548  WBC 12.5* 8.8  HGB 11.2* 9.1*  HCT 37.6 31.9*  PLT 294 230  MCV 82.5 83.7  MCH 24.6* 23.9*  MCHC 29.8* 28.5*  RDW 18.0* 18.3*  LYMPHSABS 1.2  --   MONOABS 0.6  --   EOSABS 0.1  --   BASOSABS 0.0  --     Chemistries   Recent Labs Lab 11/29/14 0905 11/29/14 0907 11/29/14 2211 11/30/14 0548 12/01/14 0550  NA 138  --   --  141 141  K 2.0*  --  3.6 3.4* 3.3*  CL 116*  --   --  122* 122*  CO2 14*  --   --  14* 16*  GLUCOSE 101*  --   --  76 82  BUN 13  --   --  13 11  CREATININE 1.88*  --   --  1.52* 1.38*  CALCIUM 8.5  --   --  7.6* 7.8*  MG  --  2.2  --   --   --    ------------------------------------------------------------------------------------------------------------------ estimated creatinine clearance is 67.4 mL/min (by C-G formula based on Cr of 1.38). ------------------------------------------------------------------------------------------------------------------ No results for input(s): HGBA1C in the last 72 hours. ------------------------------------------------------------------------------------------------------------------ No results for input(s): CHOL, HDL, LDLCALC, TRIG, CHOLHDL, LDLDIRECT in the last 72  hours. ------------------------------------------------------------------------------------------------------------------ No results for input(s): TSH, T4TOTAL, T3FREE, THYROIDAB in the last 72 hours.  Invalid input(s): FREET3 ------------------------------------------------------------------------------------------------------------------ No results for input(s): VITAMINB12, FOLATE, FERRITIN, TIBC, IRON, RETICCTPCT in the last 72 hours.  Coagulation profile No results for input(s): INR, PROTIME in the last 168 hours.  No results for input(s): DDIMER in the last 72 hours.  Cardiac Enzymes No results for input(s): CKMB, TROPONINI, MYOGLOBIN in the last 168 hours.  Invalid input(s): CK ------------------------------------------------------------------------------------------------------------------ Invalid input(s): POCBNP     Time Spent in minutes   25   Chaya Jan M.D on 12/01/2014 at 12:36  PM  Between 7am to 7pm - Pager - [phone]  After 7pm go to [email or web address] - password [facility]  [facility]   Office  [phone]

## 2014-12-01 NOTE — Progress Notes (Signed)
Refused Miralax

## 2014-12-02 ENCOUNTER — Inpatient Hospital Stay (HOSPITAL_COMMUNITY): Payer: Medicaid - Out of State

## 2014-12-02 LAB — CBC
HCT: 32.6 % — ABNORMAL LOW (ref 36.0–46.0)
Hemoglobin: 9.1 g/dL — ABNORMAL LOW (ref 12.0–15.0)
MCH: 23.5 pg — AB (ref 26.0–34.0)
MCHC: 27.9 g/dL — AB (ref 30.0–36.0)
MCV: 84.2 fL (ref 78.0–100.0)
PLATELETS: 236 10*3/uL (ref 150–400)
RBC: 3.87 MIL/uL (ref 3.87–5.11)
RDW: 18.7 % — AB (ref 11.5–15.5)
WBC: 7.8 10*3/uL (ref 4.0–10.5)

## 2014-12-02 LAB — URINE CULTURE
CULTURE: NO GROWTH
Colony Count: NO GROWTH
Special Requests: NORMAL

## 2014-12-02 LAB — BASIC METABOLIC PANEL
Anion gap: 5 (ref 5–15)
BUN: 12 mg/dL (ref 6–23)
CO2: 15 mmol/L — ABNORMAL LOW (ref 19–32)
CREATININE: 1.45 mg/dL — AB (ref 0.50–1.10)
Calcium: 7.8 mg/dL — ABNORMAL LOW (ref 8.4–10.5)
Chloride: 120 mmol/L — ABNORMAL HIGH (ref 96–112)
GFR calc Af Amer: 59 mL/min — ABNORMAL LOW (ref >90)
GFR calc non Af Amer: 51 mL/min — ABNORMAL LOW (ref >90)
GLUCOSE: 82 mg/dL (ref 70–99)
POTASSIUM: 3.4 mmol/L — AB (ref 3.5–5.1)
Sodium: 140 mmol/L (ref 135–145)

## 2014-12-02 MED ORDER — POTASSIUM CHLORIDE CRYS ER 20 MEQ PO TBCR
40.0000 meq | EXTENDED_RELEASE_TABLET | Freq: Once | ORAL | Status: AC
  Administered 2014-12-02: 40 meq via ORAL
  Filled 2014-12-02: qty 2

## 2014-12-02 MED ORDER — POTASSIUM CHLORIDE 2 MEQ/ML IV SOLN: INTRAVENOUS | Status: DC

## 2014-12-02 MED ORDER — OXYCODONE-ACETAMINOPHEN 5-325 MG PO TABS
1.0000 | ORAL_TABLET | ORAL | Status: DC | PRN
  Administered 2014-12-02 – 2014-12-03 (×5): 2 via ORAL
  Filled 2014-12-02 (×5): qty 2

## 2014-12-02 MED ORDER — KCL IN DEXTROSE-NACL 40-5-0.45 MEQ/L-%-% IV SOLN
INTRAVENOUS | Status: DC
  Administered 2014-12-02 – 2014-12-03 (×2): via INTRAVENOUS

## 2014-12-02 MED ORDER — METOCLOPRAMIDE HCL 5 MG/ML IJ SOLN
5.0000 mg | Freq: Four times a day (QID) | INTRAMUSCULAR | Status: DC
  Administered 2014-12-02 – 2014-12-03 (×3): 5 mg via INTRAVENOUS
  Filled 2014-12-02 (×4): qty 2

## 2014-12-02 MED ORDER — TAMSULOSIN HCL 0.4 MG PO CAPS
0.4000 mg | ORAL_CAPSULE | Freq: Every day | ORAL | Status: DC
  Administered 2014-12-02 – 2014-12-03 (×2): 0.4 mg via ORAL
  Filled 2014-12-02 (×2): qty 1

## 2014-12-02 NOTE — Progress Notes (Signed)
Patient states "I'm going home with a catheter. Take it out and let me try to void on my own." Dr. Kerry HoughMemon notified, orders to remove catheter for voiding trial.  Patient aware that foley is to be removed with the understanding that if she is having trouble voiding or retaining urine, a bladder scan will be done and if there is positive urine in the bladder, the foley will have to be replaced.  Foley removed without difficulty, patient instructed to ambulate in the halls, which was done.

## 2014-12-02 NOTE — Progress Notes (Signed)
Patient Demographics  Chelsea [name], is a 24 y.o. female, DOB - 05-17-1991, ZOX:[id number]  Admit date - 11/29/2014   Admitting Physician Leroy SeaPrashant K Singh, MD  Outpatient Primary MD for the patient is ADDIS,DANIEL, DO  LOS - 3   Chief Complaint  Patient presents with  . Pain        Subjective:    Chelsea [name] continues to complain of flank pain. Has nausea and vomiting. Report she is unable to keep anything down. Had urinary retention and had foley catheter placed.   Assessment & Plan    1. Acute Pyelonephritis in a patient with history of renal stone. Recent CT scan done one week ago shows a nonobstructing calculi. Has improved on empiric IV Rocephin, blood urine cultures obtained and are pending, stable renal ultrasound - ruled out obstruction. IV antibiotics for 1-2 more days then place on oral Ceftin for another 10 to complete 14 days total.    2. Severe hypokalemia due to nausea vomiting. K 3.3 today. Continue to replace. Mg ok at 2.2.    3. 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 Baseline Bicarb is around 15, No acute issues.    4. History of rickets. No acute issues outpatient follow-up with PCP.     5. 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 Baseline creatinine 1.4. Acute renal failure due to #1 above. Cr is back to baseline today at 1.38.   6. Acute Urinary Retention. Had I and O cath yesterday. Inability to urinate again today and bladder scan with >999 ml. Will place foley and request GU consult. If patient stable for DC prior to Tuesday, can leave with foley and follow with GU as an OP.  7. Nausea and vomiting. Patient describes abdominal pain with nausea and vomiting. She has a history of ventral hernias. Will check CT abdomen and pelvis to rule  out any signs of obstruction. Continue antiemetics and ambulation.      Code Status: Full  Family Communication: None present  Disposition Plan: Home likely in 1-2 days   Procedures   Renal ultrasound. Nonacute   Consults      Medications  Scheduled Meds: . cefTRIAXone (ROCEPHIN)  IV  1 g Intravenous Q24H  . heparin  5,000 Units Subcutaneous 3 times per day  . metoCLOPramide (REGLAN) injection  5 mg Intravenous 4 times per day  . pantoprazole  40 mg Oral BID  . polyethylene glycol  17 g Oral BID  . sodium bicarbonate  650 mg Oral BID  . tamsulosin  0.4 mg Oral Daily   Continuous Infusions: . dextrose 5 % and 0.45 % NaCl with KCl 40 mEq/L 100 mL/hr at 12/02/14 1527   PRN Meds:.alum & mag hydroxide-simeth, docusate sodium, guaiFENesin-dextromethorphan, ondansetron **OR** ondansetron (ZOFRAN) IV, oxyCODONE-acetaminophen  DVT Prophylaxis   Heparin    Lab Results  Component Value Date   PLT 236 12/02/2014    Antibiotics     Anti-infectives    Start     Dose/Rate Route Frequency Ordered Stop   11/29/14 1400  cefTRIAXone (ROCEPHIN) 1 g in dextrose 5 % 50 mL IVPB     1 g 100 mL/hr over 30 Minutes Intravenous Every 24 hours 11/29/14 1228  11/29/14 1030  cefTRIAXone (ROCEPHIN) 1 g in dextrose 5 % 50 mL IVPB  Status:  Discontinued     1 g 100 mL/hr over 30 Minutes Intravenous Every 24 hours 11/29/14 1026 11/29/14 1228          Objective:   Filed Vitals:   12/01/14 1438 12/01/14 2237 12/02/14 0557 12/02/14 1300  BP: 123/57 98/57 101/45 120/63  Pulse: 87 83 79 85  Temp: 98.7 F (37.1 C) 97.7 F (36.5 C) 98.4 F (36.9 C) 99 F (37.2 C)  TempSrc: Oral Oral Oral Oral  Resp: Height:      Weight:   111.131 kg (245 lb)   SpO2: 100% 100% 100% 100%    Wt Readings from Last 3 Encounters:  12/02/14 111.131 kg (245 lb)  11/20/14 104.327 kg (230 lb)  10/16/14 106.595 kg (235 lb)     Intake/Output Summary (Last 24 hours) at 12/02/14  1940 Last data filed at 12/02/14 1836  Gross per 24 hour  Intake 1722.5 ml  Output   6400 ml  Net -4677.5 ml     Physical Exam  Awake Alert, Oriented X 3, No new F.N deficits, Normal affect [place].AT,PERRAL Supple Neck,No JVD, No cervical lymphadenopathy appriciated.  Symmetrical Chest wall movement, Good air movement bilaterally, CTAB RRR,No Gallops,Rubs or new Murmurs, No Parasternal Heave +ve B.Sounds, Abd Soft, No tenderness, No organomegaly appriciated, No rebound - guarding or rigidity. Mild right-sided flank pain No Cyanosis, Clubbing or edema, No new Rash or bruise      Data Review   Micro Results Recent Results (from the past 240 hour(s))  Urine culture     Status: None   Collection Time: 11/29/14  9:38 AM  Result Value Ref Range Status   Specimen Description URINE, CLEAN CATCH  Final   Special Requests Normal  Final   Colony Count NO GROWTH Performed at [facility]   Final   Culture NO GROWTH Performed at [facility]   Final   Report Status 12/02/2014 FINAL  Final  Blood culture (routine x 2)     Status: None (Preliminary result)   Collection Time: 11/29/14 10:29 AM  Result Value Ref Range Status   Specimen Description BLOOD LEFT ANTECUBITAL  Final   Special Requests BOTTLES DRAWN AEROBIC AND ANAEROBIC 6CC  Final   Culture NO GROWTH 3 DAYS  Final   Report Status PENDING  Incomplete  Blood culture (routine x 2)     Status: None (Preliminary result)   Collection Time: 11/29/14 10:57 AM  Result Value Ref Range Status   Specimen Description LEFT ANTECUBITAL  Final   Special Requests BOTTLES DRAWN AEROBIC ONLY 6CC  Final   Culture NO GROWTH 3 DAYS  Final   Report Status PENDING  Incomplete    Radiology Reports Ct Abdomen Pelvis Wo Contrast  12/02/2014   CLINICAL DATA:  RIGHT flank pain for several days, abdominal pain, vomiting, history kidney stones  EXAM: CT ABDOMEN AND PELVIS WITHOUT CONTRAST  TECHNIQUE: Multidetector CT imaging of the  abdomen and pelvis was performed following the standard protocol without IV contrast. Sagittal and coronal MPR images reconstructed from axial data set. Oral contrast not administered.  COMPARISON:  11/20/2014  FINDINGS: Lung bases clear.  Markedly atrophic and partially calcified LEFT kidney.  Significant medullary calcinosis of the RIGHT kidney with scattered calculi, largest a 2.4 x 1.8 x 2.1 cm diameter lower pole calculus.  No hydronephrosis identified.  Ureters normal in caliber  and course without dilatation or ureteral calcification.  Gallbladder contracted.  Liver, spleen, pancreas, and adrenal glands unremarkable for technique.  Question soft tissue mass at inferior LEFT breast 3.5 x 2.3 cm image 12.  Unremarkable bladder, ureters, uterus and adnexa.  Appendix not identified.  Three ventral hernias are identified at the midline in the mid abdomen.  Cranial most hernia contains nonobstructed transverse colon.  Supraumbilical hernia contains nonobstructed small bowel.  Umbilical hernia contains nonobstructed small bowel.  Stomach and bowel loops otherwise normal appearance.  No mass, adenopathy, free fluid, free air or inflammatory process.  No acute osseous findings.  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  Multiple RIGHT renal calculi largest 2.4 x 1.8 x 2.1 cm at lower pole.  No evidence of hydronephrosis, hydroureter or ureteral calcification.  Three ventral hernias containing nonobstructed bowel as above.  Question soft tissue mass at inferior LEFT breast versus asymmetric breast tissue ; correlation with physical exam and dedicated breast imaging recommended to exclude breast mass.   Electronically Signed   By: Ulyses Southward M.D.   On: 12/02/2014 17:30   US Renal  11/29/2014   CLINICAL DATA:  One week history of right flank pain with nausea and vomiting  EXAM: RENAL/URINARY TRACT ULTRASOUND COMPLETE  COMPARISON:  CT abdomen and pelvis November 20, 2014  FINDINGS: Right  Kidney:  Length: 13.2 cm. Increased echogenicity throughout the medullary portion the kidney is consistent with nephrocalcinosis. There is renal cortical thinning. The echogenicity of the kidney is normal. There is no well-defined mass or hydronephrosis. No perinephric fluid. No sonographically demonstrable ureterectasis.  Left Kidney:  Length: 8.4 cm. Left kidney is diffusely atrophic with nephrocalcinosis. The echogenicity of the left kidney is normal. No mass or hydronephrosis. No perinephric fluid. No ureterectasis.  Bladder:  Appears normal for degree of bladder distention.  IMPRESSION: Atrophic left kidney. Bilateral nephrocalcinosis. No obstructing foci in either kidney. No masses are seen. No perinephric fluid on either side. Renal cortical thinning bilaterally with normal renal echogenicity bilaterally.   Electronically Signed   By: Bretta Bang III M.D.   On: 11/29/2014 11:26   Ct Renal Stone Study  11/20/2014   CLINICAL DATA:  24 year old female with right flank pain for 4 weeks and microscopic hematuria. Initial encounter. Nephrolithiasis history.  EXAM: CT ABDOMEN AND PELVIS WITHOUT CONTRAST  TECHNIQUE: Multidetector CT imaging of the abdomen and pelvis was performed following the standard protocol without IV contrast.  COMPARISON:  CT Abdomen and Pelvis 10/16/2014 and earlier.  FINDINGS: Large body habitus.  Stable and negative lung bases. Late subacute to chronic right posterior eleventh rib fracture with callus. No acute osseous abnormality identified.  No pelvic free fluid. Negative non contrast uterus and adnexa. Decompressed rectum. Mildly redundant sigmoid colon with occasional diverticula but no active inflammation. Negative left colon.  Several broad-based ventral abdominal bowel and mesenteric containing hernias are re - identified, appear stable in size and configuration, and do not appear incarcerated. Both large and small bowel loops are involved. Retained stool in the proximal  colon. There is a right colon anastomosis re- identified. Terminal ileum within normal limits. No dilated or inflamed small bowel. Negative stomach and duodenum.  Noncontrast liver, gallbladder, spleen, pancreas and adrenal glands are within normal limits. No abdominal free fluid.  Severe left renal atrophy again noted. Bilateral nephrocalcinosis re- identified. The right double-J ureteral stent is been removed. No hydronephrosis. No perinephric stranding. Confluent amorphous calcification in the right lower pole renal collecting  system is stable. No right hydroureter. Minimal residual right periureteral stranding. Diminutive left ureter. Unremarkable bladder. No lymphadenopathy.  IMPRESSION: 1. Interval removed right double-J ureteral stent with no acute obstructive uropathy. 2. Chronic severe left renal atrophy. Extensive bilateral nephrocalcinosis. Bulky right lower pole nephrolithiasis appears stable. 3. Multiple non incarcerated appearing ventral abdominal hernias are stable. No bowel obstruction or focal bowel inflammation. Ascending colon anastomosis. 4. Late subacute to early chronic posterior right eleventh rib fracture.   Electronically Signed   By: Odessa Fleming M.D.   On: 11/20/2014 08:40     CBC  Recent Labs Lab 11/29/14 0905 11/30/14 0548 12/02/14 0525  WBC 12.5* 8.8 7.8  HGB 11.2* 9.1* 9.1*  HCT 37.6 31.9* 32.6*  PLT 294 230 236  MCV 82.5 83.7 84.2  MCH 24.6* 23.9* 23.5*  MCHC 29.8* 28.5* 27.9*  RDW 18.0* 18.3* 18.7*  LYMPHSABS 1.2  --   --   MONOABS 0.6  --   --   EOSABS 0.1  --   --   BASOSABS 0.0  --   --     Chemistries   Recent Labs Lab 11/29/14 0905 11/29/14 0907 11/29/14 2211 11/30/14 0548 12/01/14 0550 12/02/14 0525  NA 138  --   --  141 141 140  K 2.0*  --  3.6 3.4* 3.3* 3.4*  CL 116*  --   --  122* 122* 120*  CO2 14*  --   --  14* 16* 15*  GLUCOSE 101*  --   --  76 82 82  BUN 13  --   --  CREATININE 1.88*  --   --  1.52* 1.38* 1.45*  CALCIUM 8.5   --   --  7.6* 7.8* 7.8*  MG  --  2.2  --   --   --   --    ------------------------------------------------------------------------------------------------------------------ estimated creatinine clearance is 66.3 mL/min (by C-G formula based on Cr of 1.45). ------------------------------------------------------------------------------------------------------------------ No results for input(s): HGBA1C in the last 72 hours. ------------------------------------------------------------------------------------------------------------------ No results for input(s): CHOL, HDL, LDLCALC, TRIG, CHOLHDL, LDLDIRECT in the last 72 hours. ------------------------------------------------------------------------------------------------------------------ No results for input(s): TSH, T4TOTAL, T3FREE, THYROIDAB in the last 72 hours.  Invalid input(s): FREET3 ------------------------------------------------------------------------------------------------------------------ No results for input(s): VITAMINB12, FOLATE, FERRITIN, TIBC, IRON, RETICCTPCT in the last 72 hours.  Coagulation profile No results for input(s): INR, PROTIME in the last 168 hours.  No results for input(s): DDIMER in the last 72 hours.  Cardiac Enzymes No results for input(s): CKMB, TROPONINI, MYOGLOBIN in the last 168 hours.  Invalid input(s): CK ------------------------------------------------------------------------------------------------------------------ Invalid input(s): POCBNP     Time Spent in minutes   25   Sherelle Castelli M.D on 12/02/2014 at 7:40 PM  Between 7am to 7pm - Pager - [phone]  After 7pm go to [email or web address] - password [email or web address]  [facility]   Office  [phone]

## 2014-12-03 DIAGNOSIS — N179 Acute kidney failure, unspecified: Secondary | ICD-10-CM | POA: Insufficient documentation

## 2014-12-03 DIAGNOSIS — R109 Unspecified abdominal pain: Secondary | ICD-10-CM

## 2014-12-03 LAB — BASIC METABOLIC PANEL
Anion gap: 5 (ref 5–15)
BUN: 13 mg/dL (ref 6–23)
CHLORIDE: 121 mmol/L — AB (ref 96–112)
CO2: 15 mmol/L — ABNORMAL LOW (ref 19–32)
Calcium: 7.7 mg/dL — ABNORMAL LOW (ref 8.4–10.5)
Creatinine, Ser: 1.52 mg/dL — ABNORMAL HIGH (ref 0.50–1.10)
GFR calc Af Amer: 55 mL/min — ABNORMAL LOW (ref >90)
GFR calc non Af Amer: 48 mL/min — ABNORMAL LOW (ref >90)
GLUCOSE: 85 mg/dL (ref 70–99)
POTASSIUM: 3.6 mmol/L (ref 3.5–5.1)
Sodium: 141 mmol/L (ref 135–145)

## 2014-12-03 MED ORDER — OXYCODONE-ACETAMINOPHEN 5-325 MG PO TABS: 1.0000 | ORAL_TABLET | ORAL | Status: DC | PRN

## 2014-12-03 MED ORDER — TAMSULOSIN HCL 0.4 MG PO CAPS: 0.4000 mg | ORAL_CAPSULE | Freq: Every day | ORAL | Status: DC

## 2014-12-03 MED ORDER — PROMETHAZINE HCL 25 MG PO TABS: 25.0000 mg | ORAL_TABLET | Freq: Four times a day (QID) | ORAL | Status: DC | PRN

## 2014-12-03 MED ORDER — SODIUM BICARBONATE 650 MG PO TABS: 650.0000 mg | ORAL_TABLET | Freq: Two times a day (BID) | ORAL | Status: DC

## 2014-12-03 MED ORDER — LEVOFLOXACIN 750 MG PO TABS: 750.0000 mg | ORAL_TABLET | Freq: Every day | ORAL | Status: DC

## 2014-12-03 NOTE — Discharge Summary (Signed)
Physician Discharge Summary  Chelsea [name] ONG:[id number] DOB: [birth year] DOA: 11/29/2014  PCP: ADDIS,DANIEL, DO  Admit date: 11/29/2014 Discharge date: 12/03/2014  Time spent: 40 minutes  Recommendations for Outpatient Follow-up:  1. Follow up with primary nephrologist and primary urologist in [place] in the next 1-2 weeks 2. Patient will need dedicated breast imaging of left breast for possible soft tissue mass to be done as an outpatient, will defer this to the patient's primary care physician  Discharge Diagnoses:  Principal Problem:   Pyelonephritis Active Problems:   ARF (acute renal failure)   History of rickets   RTA (renal tubular acidosis)   Hypokalemia   Kidney stone on right side   Acute urinary retention   Acute renal injury   Flank pain   Discharge Condition: stable  Diet recommendation: low salt  Filed Weights   11/30/14 0535 12/01/14 0548 12/02/14 0557  Weight: 104.6 kg (230 lb 9.6 oz) 105.688 kg (233 lb) 111.131 kg (245 lb)    History of present illness:  This patient presents to the hospital with 2 week history of dysuria, right flank pain, chills and nausea and vomiting. She was found to have a possible pyelonephritis in the emergency room and was admitted for further management with IV antibiotics.  Hospital Course:  Patient was started on intravenous Rocephin. Interestingly, her urine culture did not show any significant growth. Blood cultures did not show any growth. The patient was afebrile throughout her hospital course. She continued to complain of right flank pain, persistent nausea and vomiting. Imaging of her abdomen did not indicate any ventral hernia obstruction, no hydronephrosis bilaterally.  She did develop an episode of urinary retention. Foley catheter was placed. Case was discussed with urology and recommendations were to start the patient on Flomax and have outpatient follow-up with urology in 1-2 weeks for voiding trial. Patient was  very insistent on removing Foley catheter prior to discharge. This was removed and patient underwent a voiding trial she did not indicate any residual volume in her bladder. She was asked to follow-up with her urologist closely in the next 1 week.  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 Her oral bicarbonate dosing was increased. This can be followed in the outpatient setting. She is otherwise asymptomatic.  She did complain of persistent nausea and vomiting, although she was able to keep all her meals down. On further questioning, she reports that she takes Phenergan at home indicating that this may be more chronic process. At the time of discharge, the patient's pain appears to be objectively controlled. She does not appear to be in any distress. Patient will be discharged home today to follow up with her primary care physician, primary nephrologist and urologist in [place].  Of note, she was incidentally found to have a possible left breast soft tissue mass on imaging. She will need further dedicated imaging of her breast in the outpatient setting. Will defer this to her primary care physician to arrange.  Procedures:    Consultations - telephone consult with urology, Dr. Mena Goes  Discharge Exam: Filed Vitals:   12/02/14 1300  BP: 120/63  Pulse: 85  Temp: 99 F (37.2 C)  Resp: 20    General: NAD Cardiovascular: S1, S3 RRR Respiratory: CTA B  Discharge Instructions   Discharge Instructions    Call MD for:  persistant nausea and vomiting    Complete by:  As directed      Call  MD for:  severe uncontrolled pain    Complete by:  As directed      Call MD for:  temperature >100.4    Complete by:  As directed      Diet - low sodium heart healthy    Complete by:  As directed      Increase activity slowly    Complete by:  As directed           Discharge Medication List as of 12/03/2014 12:30 PM    START taking these  medications   Details  levofloxacin (LEVAQUIN) 750 MG tablet Take 1 tablet (750 mg total) by mouth daily., Starting 12/03/2014, Until Discontinued, Print    tamsulosin (FLOMAX) 0.4 MG CAPS capsule Take 1 capsule (0.4 mg total) by mouth daily., Starting 12/03/2014, Until Discontinued, Print      CONTINUE these medications which have CHANGED   Details  oxyCODONE-acetaminophen (PERCOCET/ROXICET) 5-325 MG per tablet Take 1 tablet by mouth every 4 (four) hours as needed., Starting 12/03/2014, Until Discontinued, Print    promethazine (PHENERGAN) 25 MG tablet Take 1 tablet (25 mg total) by mouth every 6 (six) hours as needed for nausea or vomiting., Starting 12/03/2014, Until Discontinued, Print    sodium bicarbonate 650 MG tablet Take 1 tablet (650 mg total) by mouth 2 (two) times daily., Starting 12/03/2014, Until Discontinued, Print      CONTINUE these medications which have NOT CHANGED   Details  acetaminophen (TYLENOL) 500 MG tablet Take 500-1,000 mg by mouth every 6 (six) hours as needed for headache., Until Discontinued, Historical Med    docusate sodium (COLACE) 100 MG capsule Take 100 mg by mouth daily as needed for mild constipation. , Until Discontinued, Historical Med    ferrous sulfate (FERROUSUL) 325 (65 FE) MG tablet Take 325 mg by mouth at bedtime., Until Discontinued, Historical Med    magnesium hydroxide (MILK OF MAGNESIA) 400 MG/5ML suspension Take 30 mLs by mouth daily as needed for mild constipation., Until Discontinued, Historical Med    MAGNESIUM PO Take 1 tablet by mouth daily., Until Discontinued, Historical Med    Multiple Vitamin (MULTIVITAMIN WITH MINERALS) TABS tablet Take 1 tablet by mouth daily., Until Discontinued, Historical Med    pantoprazole (PROTONIX) 40 MG tablet Take 40 mg by mouth 2 (two) times daily. , Until Discontinued, Historical Med    polyethylene glycol (MIRALAX / GLYCOLAX) packet Take 17 g by mouth 2 (two) times daily., Until Discontinued, Historical  Med    potassium citrate (UROCIT-K) 10 MEQ (1080 MG) SR tablet Take 20 mEq by mouth 3 (three) times daily with meals., Until Discontinued, Historical Med      STOP taking these medications     HYDROcodone-acetaminophen (NORCO/VICODIN) 5-325 MG per tablet      ondansetron (ZOFRAN ODT) 4 MG disintegrating tablet        No Known Allergies Follow-up Information    Follow up with ADDIS,DANIEL, DO. Schedule an appointment as soon as possible for a visit in 1 week.   Specialty:  Family Medicine   Why:  Hospital Follow up   Contact information:   [address] [place] [address] [phone]        The results of significant diagnostics from this hospitalization (including imaging, microbiology, ancillary and laboratory) are listed below for reference.    Significant Diagnostic Studies: Ct Abdomen Pelvis Wo Contrast  12/02/2014   CLINICAL DATA:  RIGHT flank pain for several days, abdominal pain, vomiting, history kidney stones  EXAM: CT  ABDOMEN AND PELVIS WITHOUT CONTRAST  TECHNIQUE: Multidetector CT imaging of the abdomen and pelvis was performed following the standard protocol without IV contrast. Sagittal and coronal MPR images reconstructed from axial data set. Oral contrast not administered.  COMPARISON:  11/20/2014  FINDINGS: Lung bases clear.  Markedly atrophic and partially calcified LEFT kidney.  Significant medullary calcinosis of the RIGHT kidney with scattered calculi, largest a 2.4 x 1.8 x 2.1 cm diameter lower pole calculus.  No hydronephrosis identified.  Ureters normal in caliber and course without dilatation or ureteral calcification.  Gallbladder contracted.  Liver, spleen, pancreas, and adrenal glands unremarkable for technique.  Question soft tissue mass at inferior LEFT breast 3.5 x 2.3 cm image 12.  Unremarkable bladder, ureters, uterus and adnexa.  Appendix not identified.  Three ventral hernias are identified at the midline in the mid abdomen.  Cranial most hernia  contains nonobstructed transverse colon.  Supraumbilical hernia contains nonobstructed small bowel.  Umbilical hernia contains nonobstructed small bowel.  Stomach and bowel loops otherwise normal appearance.  No mass, adenopathy, free fluid, free air or inflammatory process.  No acute osseous findings.  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  Multiple RIGHT renal calculi largest 2.4 x 1.8 x 2.1 cm at lower pole.  No evidence of hydronephrosis, hydroureter or ureteral calcification.  Three ventral hernias containing nonobstructed bowel as above.  Question soft tissue mass at inferior LEFT breast versus asymmetric breast tissue ; correlation with physical exam and dedicated breast imaging recommended to exclude breast mass.   Electronically Signed   By: Ulyses SouthwardMark  Boles M.D.   On: 12/02/2014 17:30   [place] Renal  11/29/2014   CLINICAL DATA:  One week history of right flank pain with nausea and vomiting  EXAM: RENAL/URINARY TRACT ULTRASOUND COMPLETE  COMPARISON:  CT abdomen and pelvis November 20, 2014  FINDINGS: Right Kidney:  Length: 13.2 cm. Increased echogenicity throughout the medullary portion the kidney is consistent with nephrocalcinosis. There is renal cortical thinning. The echogenicity of the kidney is normal. There is no well-defined mass or hydronephrosis. No perinephric fluid. No sonographically demonstrable ureterectasis.  Left Kidney:  Length: 8.4 cm. Left kidney is diffusely atrophic with nephrocalcinosis. The echogenicity of the left kidney is normal. No mass or hydronephrosis. No perinephric fluid. No ureterectasis.  Bladder:  Appears normal for degree of bladder distention.  IMPRESSION: Atrophic left kidney. Bilateral nephrocalcinosis. No obstructing foci in either kidney. No masses are seen. No perinephric fluid on either side. Renal cortical thinning bilaterally with normal renal echogenicity bilaterally.   Electronically Signed   By: Bretta BangWilliam  Woodruff III M.D.   On:  11/29/2014 11:26   Ct Renal Stone Study  11/20/2014   CLINICAL DATA:  24 year old female with right flank pain for 4 weeks and microscopic hematuria. Initial encounter. Nephrolithiasis history.  EXAM: CT ABDOMEN AND PELVIS WITHOUT CONTRAST  TECHNIQUE: Multidetector CT imaging of the abdomen and pelvis was performed following the standard protocol without IV contrast.  COMPARISON:  CT Abdomen and Pelvis 10/16/2014 and earlier.  FINDINGS: Large body habitus.  Stable and negative lung bases. Late subacute to chronic right posterior eleventh rib fracture with callus. No acute osseous abnormality identified.  No pelvic free fluid. Negative non contrast uterus and adnexa. Decompressed rectum. Mildly redundant sigmoid colon with occasional diverticula but no active inflammation. Negative left colon.  Several broad-based ventral abdominal bowel and mesenteric containing hernias are re - identified, appear stable in size and configuration, and do not appear incarcerated. Both  large and small bowel loops are involved. Retained stool in the proximal colon. There is a right colon anastomosis re- identified. Terminal ileum within normal limits. No dilated or inflamed small bowel. Negative stomach and duodenum.  Noncontrast liver, gallbladder, spleen, pancreas and adrenal glands are within normal limits. No abdominal free fluid.  Severe left renal atrophy again noted. Bilateral nephrocalcinosis re- identified. The right double-J ureteral stent is been removed. No hydronephrosis. No perinephric stranding. Confluent amorphous calcification in the right lower pole renal collecting system is stable. No right hydroureter. Minimal residual right periureteral stranding. Diminutive left ureter. Unremarkable bladder. No lymphadenopathy.  IMPRESSION: 1. Interval removed right double-J ureteral stent with no acute obstructive uropathy. 2. Chronic severe left renal atrophy. Extensive bilateral nephrocalcinosis. Bulky right lower pole  nephrolithiasis appears stable. 3. Multiple non incarcerated appearing ventral abdominal hernias are stable. No bowel obstruction or focal bowel inflammation. Ascending colon anastomosis. 4. Late subacute to early chronic posterior right eleventh rib fracture.   Electronically Signed   By: Odessa Fleming M.D.   On: 11/20/2014 08:40    Microbiology: Recent Results (from the past 240 hour(s))  Urine culture     Status: None   Collection Time: 11/29/14  9:38 AM  Result Value Ref Range Status   Specimen Description URINE, CLEAN CATCH  Final   Special Requests Normal  Final   Colony Count NO GROWTH Performed at [facility]   Final   Culture NO GROWTH Performed at [facility]   Final   Report Status 12/02/2014 FINAL  Final  Blood culture (routine x 2)     Status: None (Preliminary result)   Collection Time: 11/29/14 10:29 AM  Result Value Ref Range Status   Specimen Description BLOOD LEFT ANTECUBITAL  Final   Special Requests BOTTLES DRAWN AEROBIC AND ANAEROBIC 6CC  Final   Culture NO GROWTH 4 DAYS  Final   Report Status PENDING  Incomplete  Blood culture (routine x 2)     Status: None (Preliminary result)   Collection Time: 11/29/14 10:57 AM  Result Value Ref Range Status   Specimen Description BLOOD LEFT ANTECUBITAL  Final   Special Requests BOTTLES DRAWN AEROBIC ONLY 6CC  Final   Culture NO GROWTH 4 DAYS  Final   Report Status PENDING  Incomplete     Labs: Basic Metabolic Panel:  Recent Labs Lab 11/29/14 0905 11/29/14 0907 11/29/14 2211 11/30/14 0548 12/01/14 0550 12/02/14 0525 12/03/14 0558  NA 138  --   --  141 141 140 141  K 2.0*  --  3.6 3.4* 3.3* 3.4* 3.6  CL 116*  --   --  122* 122* 120* 121*  CO2 14*  --   --  14* 16* 15* 15*  GLUCOSE 101*  --   --  76 82 82 85  BUN 13  --   --  13 11 12 13   CREATININE 1.88*  --   --  1.52* 1.38* 1.45* 1.52*  CALCIUM 8.5  --   --  7.6* 7.8* 7.8* 7.7*  MG  --  2.2  --   --   --   --   --    Liver Function  Tests: No results for input(s): AST, ALT, ALKPHOS, BILITOT, PROT, ALBUMIN in the last 168 hours. No results for input(s): LIPASE, AMYLASE in the last 168 hours. No results for input(s): AMMONIA in the last 168 hours. CBC:  Recent Labs Lab 11/29/14 0905 11/30/14 0548 12/02/14 0525  WBC 12.5*  8.8 7.8  NEUTROABS 10.6*  --   --   HGB 11.2* 9.1* 9.1*  HCT 37.6 31.9* 32.6*  MCV 82.5 83.7 84.2  PLT 294 230 236   Cardiac Enzymes: No results for input(s): CKTOTAL, CKMB, CKMBINDEX, TROPONINI in the last 168 hours. BNP: BNP (last 3 results) No results for input(s): BNP in the last 8760 hours.  ProBNP (last 3 results) No results for input(s): PROBNP in the last 8760 hours.  CBG: No results for input(s): GLUCAP in the last 168 hours.     Signed:  MEMON,JEHANZEB  [facility] 12/03/2014, 7:04 PM

## 2014-12-03 NOTE — Progress Notes (Signed)
Discharge instructions and prescriptions given, verbalized understanding, out in stable condition ambulatory with staff. 

## 2014-12-03 NOTE — Progress Notes (Signed)
Nurse asked me to assess pt because she complain of SOB pt spo2 97% room air bs clear pt in no distress and sleeping

## 2014-12-04 LAB — CULTURE, BLOOD (ROUTINE X 2)
CULTURE: NO GROWTH
Culture: NO GROWTH

## 2015-01-13 ENCOUNTER — Emergency Department (HOSPITAL_COMMUNITY)
Admission: EM | Admit: 2015-01-13 | Discharge: 2015-01-13 | Disposition: A | Discharge status: Home / Self Care | Payer: Medicaid - Out of State

## 2015-01-13 NOTE — ED Notes (Signed)
Called for Triage, no answer.

## 2015-01-13 NOTE — ED Notes (Signed)
Called for pt x 2, pt not in waiting room. 

## 2015-05-05 IMAGING — CT CT RENAL STONE PROTOCOL
2 of 4 series · 16 of 46 positions shown, 18 images · non-contrast
Comparison: CT Abdomen and Pelvis 10/16/2014 and earlier.

CLINICAL DATA: 23-year-old female with right flank pain for 4 weeks
and microscopic hematuria. Initial encounter. Nephrolithiasis
history.

EXAM:
CT ABDOMEN AND PELVIS WITHOUT CONTRAST
TECHNIQUE: Multidetector CT imaging of the abdomen and pelvis was performed
following the standard protocol without IV contrast.

[Series 2: standard/full over (age)lbs 5.0 · axial · 0.82mm/px · z∈[-526,-60]mm · 13 of 103 slices shown, 15 images]
[im 5/103  soft-tissue]
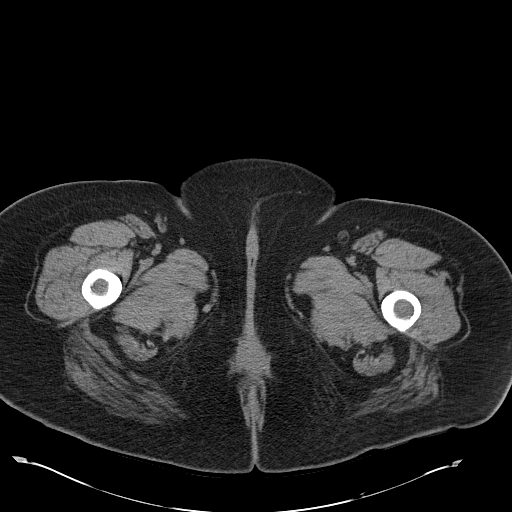
[im 5/103  bone]
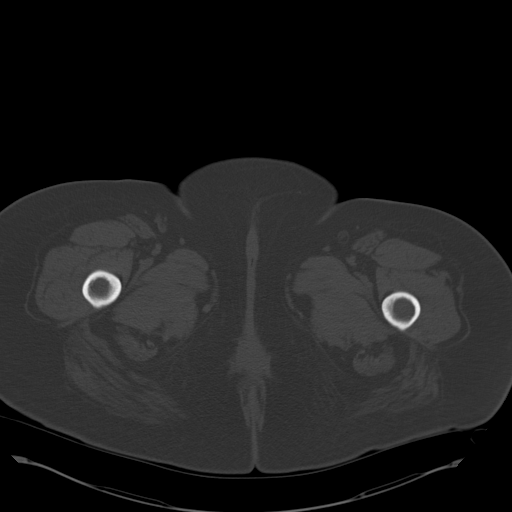
[im 13/103  soft-tissue]
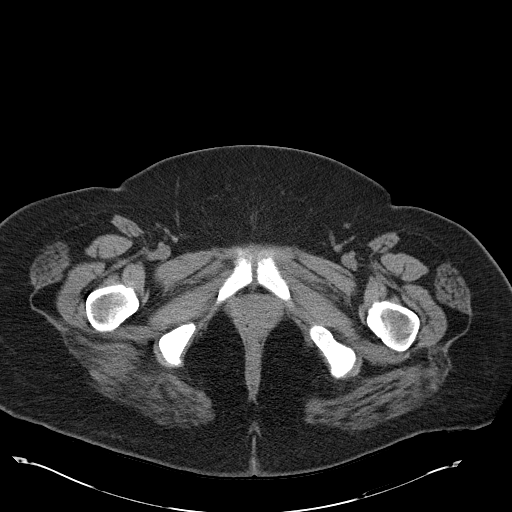
[im 21/103  soft-tissue]
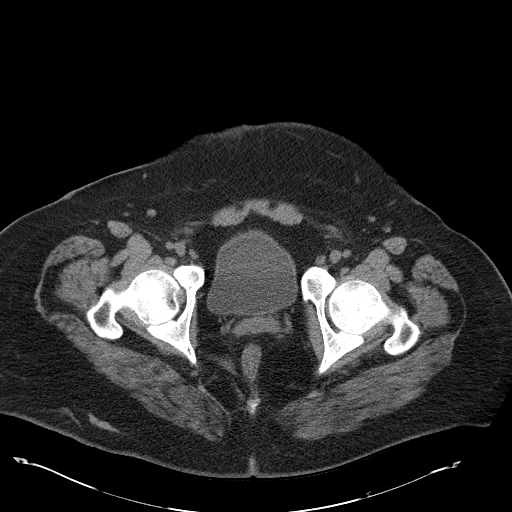
[im 29/103  soft-tissue]
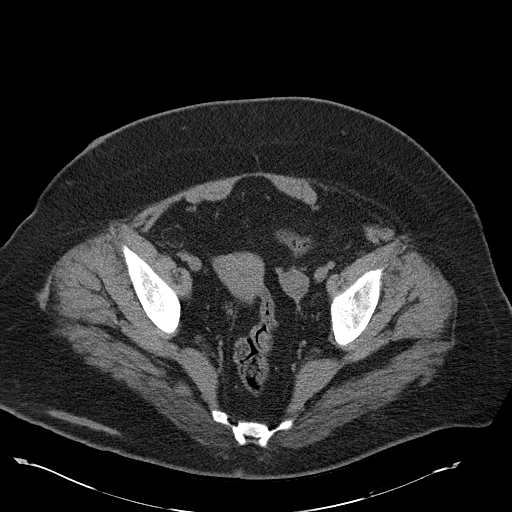
[im 37/103  soft-tissue]
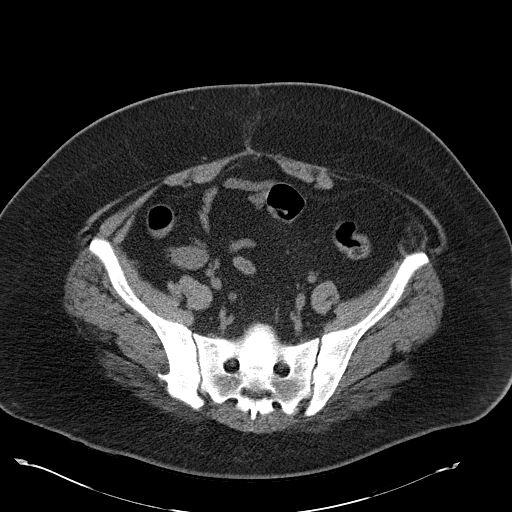
[im 45/103  soft-tissue]
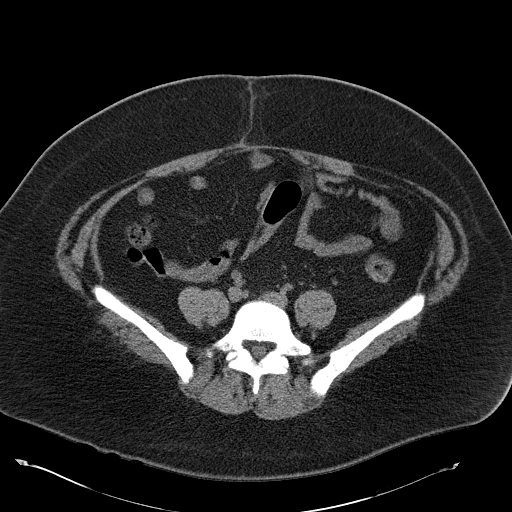
[im 54/103  soft-tissue]
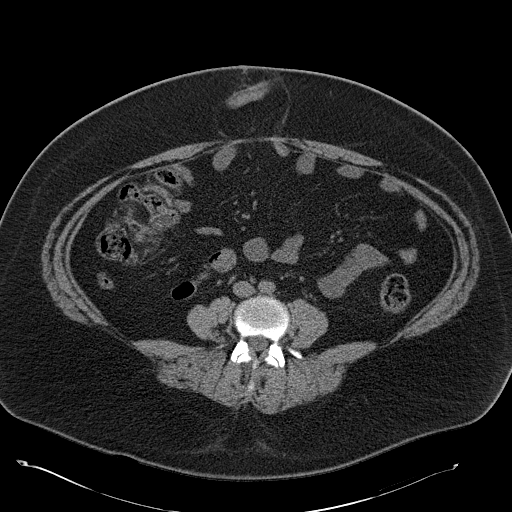
[im 58/103  soft-tissue]
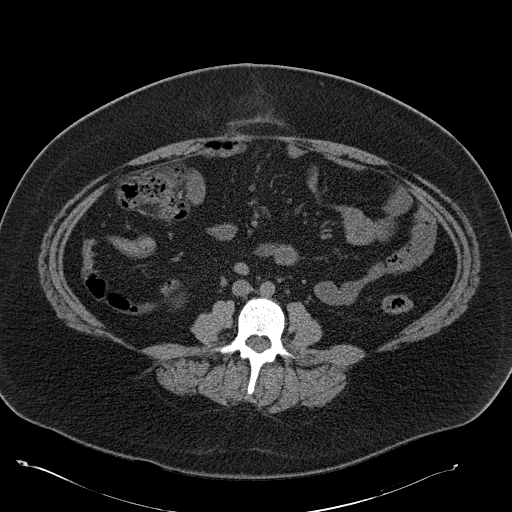
[im 66/103  soft-tissue]
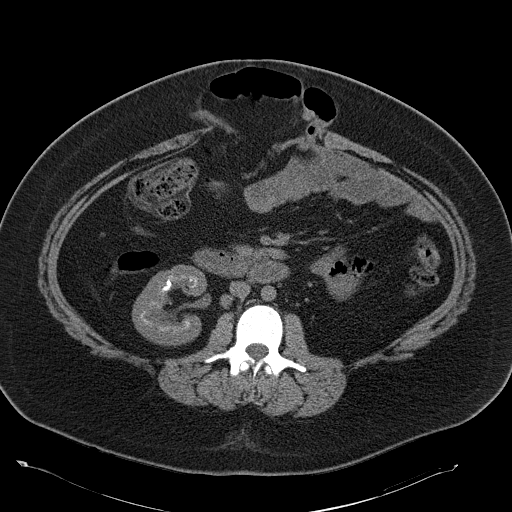
[im 66/103  bone]
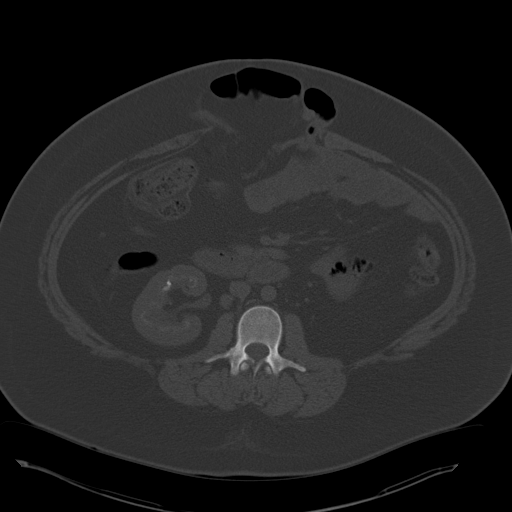
[im 74/103  soft-tissue]
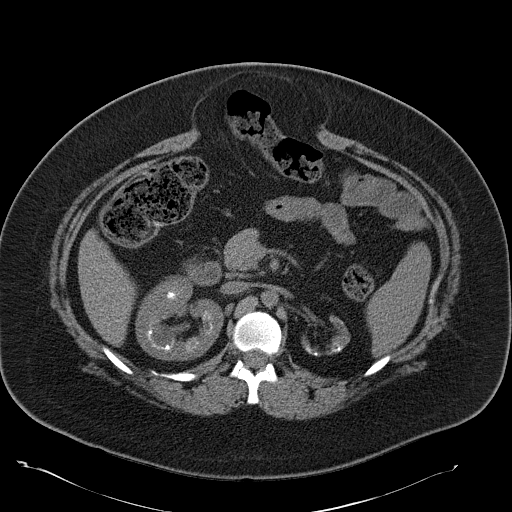
[im 82/103  soft-tissue]
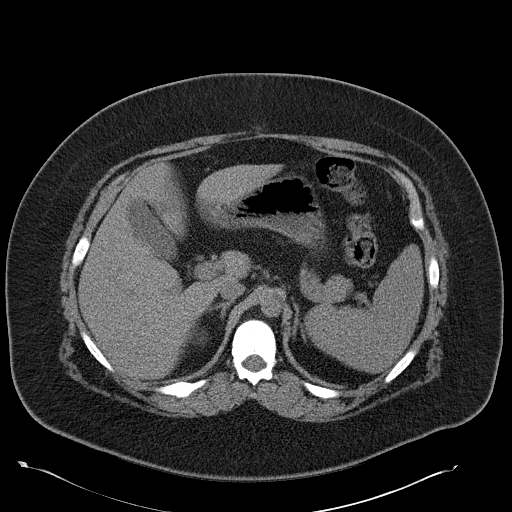
[im 90/103  soft-tissue]
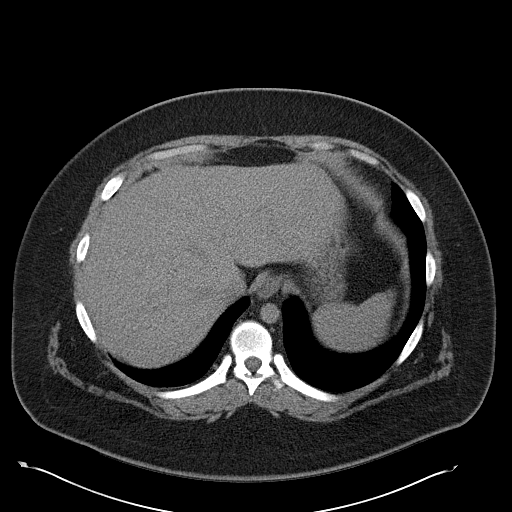
[im 98/103  soft-tissue]
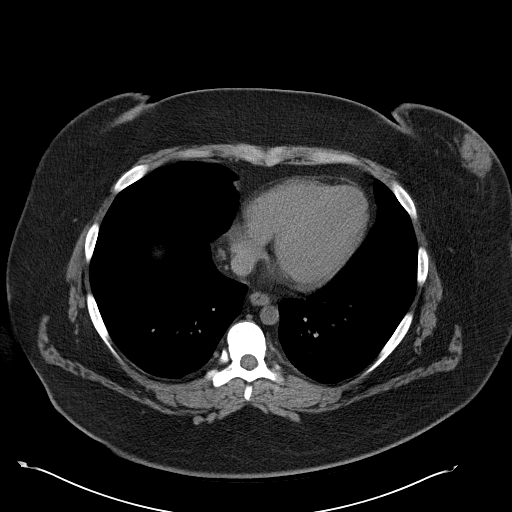

[Series 4: mpr coronal · coronal · 0.81mm/px · 3 of 106 slices shown]
[im 36/106  soft-tissue]
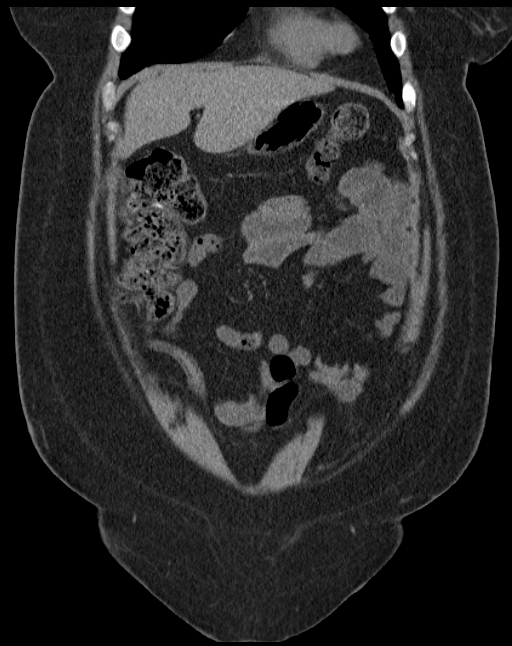
[im 47/106  soft-tissue]
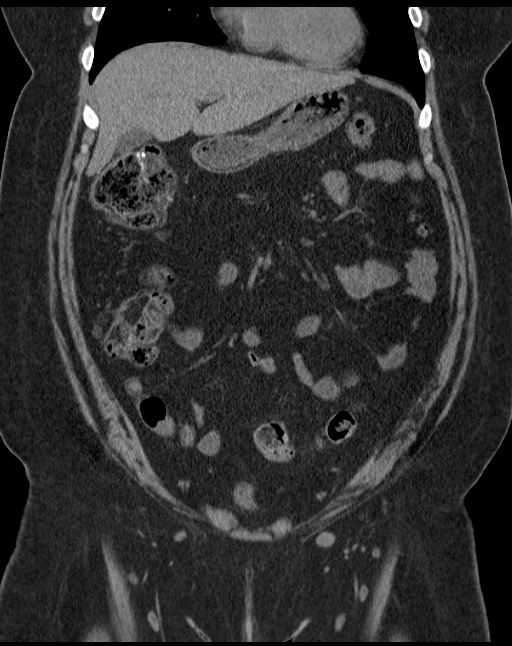
[im 59/106  soft-tissue]
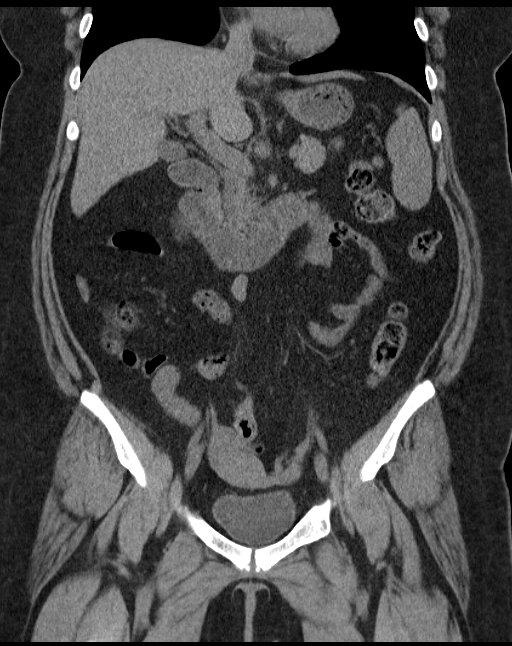

[16 of 46 positions shown; findings below may reference images not displayed]

FINDINGS: Large body habitus.

Stable and negative lung bases. Late subacute to chronic right
posterior eleventh rib fracture with callus. No acute osseous
abnormality identified.

No pelvic free fluid. Negative non contrast uterus and adnexa.
Decompressed rectum. Mildly redundant sigmoid colon with occasional
diverticula but no active inflammation. Negative left colon.

Several broad-based ventral abdominal bowel and mesenteric
containing hernias are re - identified, appear stable in size and
configuration, and do not appear incarcerated. Both large and small
bowel loops are involved. Retained stool in the proximal colon.
There is a right colon anastomosis re- identified. Terminal ileum
within normal limits. No dilated or inflamed small bowel. Negative
stomach and duodenum.

Noncontrast liver, gallbladder, spleen, pancreas and adrenal glands
are within normal limits. No abdominal free fluid.

Severe left renal atrophy again noted. Bilateral nephrocalcinosis
re- identified. The right double-J ureteral stent is been removed.
No hydronephrosis. No perinephric stranding. Confluent amorphous
calcification in the right lower pole renal collecting system is
stable. No right hydroureter. Minimal residual right periureteral
stranding. Diminutive left ureter. Unremarkable bladder. No
lymphadenopathy.
IMPRESSION: 1. Interval removed right double-J ureteral stent with no acute
obstructive uropathy.
2. Chronic severe left renal atrophy. Extensive bilateral
nephrocalcinosis. Bulky right lower pole nephrolithiasis appears
stable.
3. Multiple non incarcerated appearing ventral abdominal hernias are
stable. No bowel obstruction or focal bowel inflammation. Ascending
colon anastomosis.
4. Late subacute to early chronic posterior right eleventh rib
fracture.

## 2021-11-06 ENCOUNTER — Other Ambulatory Visit: Payer: Self-pay

## 2021-11-06 ENCOUNTER — Emergency Department (HOSPITAL_COMMUNITY): Payer: Medicaid - Out of State

## 2021-11-06 ENCOUNTER — Emergency Department (HOSPITAL_COMMUNITY)
Admission: EM | Admit: 2021-11-06 | Discharge: 2021-11-06 | Disposition: A | Discharge status: Home / Self Care | Payer: Medicaid - Out of State | Attending: Emergency Medicine | Admitting: Emergency Medicine

## 2021-11-06 DIAGNOSIS — R1033 Periumbilical pain: Secondary | ICD-10-CM | POA: Diagnosis not present

## 2021-11-06 DIAGNOSIS — R748 Abnormal levels of other serum enzymes: Secondary | ICD-10-CM | POA: Diagnosis not present

## 2021-11-06 DIAGNOSIS — R112 Nausea with vomiting, unspecified: Secondary | ICD-10-CM | POA: Insufficient documentation

## 2021-11-06 DIAGNOSIS — R1031 Right lower quadrant pain: Secondary | ICD-10-CM | POA: Diagnosis present

## 2021-11-06 DIAGNOSIS — R109 Unspecified abdominal pain: Secondary | ICD-10-CM

## 2021-11-06 LAB — CBC WITH DIFFERENTIAL/PLATELET
Abs Immature Granulocytes: 0.1 10*3/uL — ABNORMAL HIGH (ref 0.00–0.07)
Basophils Absolute: 0.1 10*3/uL (ref 0.0–0.1)
Basophils Relative: 1 %
Eosinophils Absolute: 0.1 10*3/uL (ref 0.0–0.5)
Eosinophils Relative: 1 %
HCT: 41 % (ref 36.0–46.0)
Hemoglobin: 12.9 g/dL (ref 12.0–15.0)
Immature Granulocytes: 1 %
Lymphocytes Relative: 24 %
Lymphs Abs: 1.8 10*3/uL (ref 0.7–4.0)
MCH: 27.3 pg (ref 26.0–34.0)
MCHC: 31.5 g/dL (ref 30.0–36.0)
MCV: 86.7 fL (ref 80.0–100.0)
Monocytes Absolute: 0.5 10*3/uL (ref 0.1–1.0)
Monocytes Relative: 7 %
Neutro Abs: 4.9 10*3/uL (ref 1.7–7.7)
Neutrophils Relative %: 66 %
Platelets: 217 10*3/uL (ref 150–400)
RBC: 4.73 MIL/uL (ref 3.87–5.11)
RDW: 15.2 % (ref 11.5–15.5)
WBC: 7.4 10*3/uL (ref 4.0–10.5)
nRBC: 0 % (ref 0.0–0.2)

## 2021-11-06 LAB — URINALYSIS, ROUTINE W REFLEX MICROSCOPIC
Bilirubin Urine: NEGATIVE
Glucose, UA: NEGATIVE mg/dL
Hgb urine dipstick: NEGATIVE
Ketones, ur: NEGATIVE mg/dL
Leukocytes,Ua: NEGATIVE
Nitrite: NEGATIVE
Protein, ur: 100 mg/dL — AB
Specific Gravity, Urine: 1.009 (ref 1.005–1.030)
pH: 8 (ref 5.0–8.0)

## 2021-11-06 LAB — BASIC METABOLIC PANEL
Anion gap: 8 (ref 5–15)
BUN: 17 mg/dL (ref 6–20)
CO2: 23 mmol/L (ref 22–32)
Calcium: 8.5 mg/dL — ABNORMAL LOW (ref 8.9–10.3)
Chloride: 107 mmol/L (ref 98–111)
Creatinine, Ser: 2.08 mg/dL — ABNORMAL HIGH (ref 0.44–1.00)
GFR, Estimated: 32 mL/min — ABNORMAL LOW (ref >60)
Glucose, Bld: 97 mg/dL (ref 70–99)
Potassium: 3.9 mmol/L (ref 3.5–5.1)
Sodium: 138 mmol/L (ref 135–145)

## 2021-11-06 LAB — HEPATIC FUNCTION PANEL
ALT: 25 U/L (ref 0–44)
AST: 21 U/L (ref 15–41)
Albumin: 3.8 g/dL (ref 3.5–5.0)
Alkaline Phosphatase: 68 U/L (ref 38–126)
Bilirubin, Direct: 0.1 mg/dL (ref 0.0–0.2)
Indirect Bilirubin: 0.6 mg/dL (ref 0.3–0.9)
Total Bilirubin: 0.7 mg/dL (ref 0.3–1.2)
Total Protein: 7.7 g/dL (ref 6.5–8.1)

## 2021-11-06 LAB — LIPASE, BLOOD: Lipase: 47 U/L (ref 11–51)

## 2021-11-06 LAB — PREGNANCY, URINE: Preg Test, Ur: NEGATIVE

## 2021-11-06 MED ORDER — HYDROMORPHONE HCL 1 MG/ML IJ SOLN
1.0000 mg | Freq: Once | INTRAMUSCULAR | Status: AC
  Administered 2021-11-06: 1 mg via INTRAVENOUS
  Filled 2021-11-06: qty 1

## 2021-11-06 MED ORDER — METOCLOPRAMIDE HCL 5 MG/ML IJ SOLN
10.0000 mg | Freq: Once | INTRAMUSCULAR | Status: AC
  Administered 2021-11-06: 10 mg via INTRAVENOUS
  Filled 2021-11-06: qty 2

## 2021-11-06 MED ORDER — HYDROCODONE-ACETAMINOPHEN 5-325 MG PO TABS
ORAL_TABLET | ORAL | 0 refills | Status: DC
Start: 2021-11-06 — End: 2023-03-07

## 2021-11-06 MED ORDER — METOCLOPRAMIDE HCL 10 MG PO TABS
10.0000 mg | ORAL_TABLET | Freq: Four times a day (QID) | ORAL | 0 refills | Status: DC | PRN
Start: 2021-11-06 — End: 2023-03-07

## 2021-11-06 MED ORDER — ONDANSETRON HCL 4 MG/2ML IJ SOLN
4.0000 mg | Freq: Once | INTRAMUSCULAR | Status: AC
  Administered 2021-11-06: 4 mg via INTRAVENOUS
  Filled 2021-11-06: qty 2

## 2021-11-06 NOTE — Discharge Instructions (Signed)
Avoid twisting movements bending or heavy lifting for several days.  Please follow-up with your primary care provider for follow-up.  Also, as discussed, please follow-up soon with your nephrologist.  I have also listed follow-up up information for a local nephrologist as well if needed.  Return to the emergency department for any new or worsening symptoms

## 2021-11-06 NOTE — ED Notes (Signed)
Hematuria, right flank and abd pain x 2 days. Mm wet. Nad.

## 2021-11-06 NOTE — ED Triage Notes (Signed)
Right flank pain x 2 days, hx of kidney stones. Pt vomited today and took zofan at 0900. Pt reports elevated bp in which pcp has been monitoring and is keeping a bp diary and will fu with pcp for this.

## 2021-11-06 NOTE — ED Provider Notes (Signed)
[facility] Provider Note   CSN: [id number] Arrival date & time: 11/06/21  1318     History  Chief Complaint  Patient presents with   Abdominal Pain    Chelsea [name] is a 31 y.o. female.   Abdominal Pain Associated symptoms: nausea and vomiting   Associated symptoms: no diarrhea, no dysuria, no vaginal bleeding and no vaginal discharge       Chelsea [name] is a 31 y.o. female with past medical history significant for recurrent kidney stones, and stage III kidney disease who presents to the Emergency Department complaining of right flank pain of sudden onset x 2days.  Describes pain as constant to her right flank but also having intermittent pains to her right lower abdomen.  She notes history of previous bowel obstruction with prior surgeries.  States she was told at one time that she had multiple hernias of her abdomen.  Symptoms have been associated with nausea and vomiting.  She took Zofran this morning at 9 AM, but has continued to vomit and has been unable to keep down her daily medications.  Denies any fever, chills, decreased urine output or hematuria.  She took Tylenol this morning for pain without relief.    Home Medications Prior to Admission medications   Medication Sig Start Date End Date Taking? Authorizing Provider  acetaminophen (TYLENOL) 500 MG tablet Take 500-1,000 mg by mouth every 6 (six) hours as needed for headache.    [provider]  docusate sodium (COLACE) 100 MG capsule Take 100 mg by mouth daily as needed for mild constipation.     [provider]  ferrous sulfate (FERROUSUL) 325 (65 FE) MG tablet Take 325 mg by mouth at bedtime.    [provider]  levofloxacin (LEVAQUIN) 750 MG tablet Take 1 tablet (750 mg total) by mouth daily. 12/03/14   Kathie Dike, MD  magnesium hydroxide (MILK OF MAGNESIA) 400 MG/5ML suspension Take 30 mLs by mouth daily as needed for mild constipation.    [provider]  MAGNESIUM PO Take 1 tablet by mouth daily.    [provider]  Multiple Vitamin (MULTIVITAMIN WITH MINERALS) TABS tablet Take 1 tablet by mouth daily.    [provider]  oxyCODONE-acetaminophen (PERCOCET/ROXICET) 5-325 MG per tablet Take 1 tablet by mouth every 4 (four) hours as needed. 12/03/14   Kathie Dike, MD  pantoprazole (PROTONIX) 40 MG tablet Take 40 mg by mouth 2 (two) times daily.     [provider]  polyethylene glycol (MIRALAX / GLYCOLAX) packet Take 17 g by mouth 2 (two) times daily.    [provider]  potassium citrate (UROCIT-K) 10 MEQ (1080 MG) SR tablet Take 20 mEq by mouth 3 (three) times daily with meals.    [provider]  promethazine (PHENERGAN) 25 MG tablet Take 1 tablet (25 mg total) by mouth every 6 (six) hours as needed for nausea or vomiting. 12/03/14   Kathie Dike, MD  sodium bicarbonate 650 MG tablet Take 1 tablet (650 mg total) by mouth 2 (two) times daily. 12/03/14   Kathie Dike, MD  tamsulosin (FLOMAX) 0.4 MG CAPS capsule Take 1 capsule (0.4 mg total) by mouth daily. 12/03/14   Kathie Dike, MD      Allergies    Patient has no known allergies.    Review of Systems   Review of Systems  Gastrointestinal:  Positive for abdominal pain, nausea and vomiting. Negative for blood in stool and diarrhea.  Genitourinary:  Positive for flank pain. Negative for dysuria, vaginal bleeding and vaginal discharge.  All other systems reviewed and are negative.  Physical Exam Updated Vital Signs BP (!) 161/97    Pulse 80    Temp 98.1 F (36.7 C)    Resp 16    Ht 4\' 11"  (1.499 m)    Wt (!) 147.9 kg    SpO2 94%    BMI 65.84 kg/m  Physical Exam Vitals and nursing note reviewed.  Constitutional:      Appearance: She is well-developed. She is not ill-appearing.  Cardiovascular:     Rate and Rhythm: Normal rate and regular rhythm.  Pulmonary:     Effort: Pulmonary effort is normal.     Breath sounds: Normal  breath sounds.  Chest:     Chest wall: No tenderness.  Abdominal:     Palpations: Abdomen is soft.     Tenderness: There is abdominal tenderness in the periumbilical area. There is no right CVA tenderness or left CVA tenderness.  Musculoskeletal:        General: Normal range of motion.     Right lower leg: No edema.     Left lower leg: No edema.  Skin:    General: Skin is warm.     Capillary Refill: Capillary refill takes less than 2 seconds.     Findings: No rash.  Neurological:     General: No focal deficit present.     Mental Status: She is alert.     Sensory: No sensory deficit.    ED Results / Procedures / Treatments   Labs (all labs ordered are listed, but only abnormal results are displayed) Labs Reviewed  CBC WITH DIFFERENTIAL/PLATELET - Abnormal; Notable for the following components:      Result Value   Abs Immature Granulocytes 0.10 (*)    All other components within normal limits  BASIC METABOLIC PANEL - Abnormal; Notable for the following components:   Creatinine, Ser 2.08 (*)    Calcium 8.5 (*)    GFR, Estimated 32 (*)    All other components within normal limits  URINALYSIS, ROUTINE W REFLEX MICROSCOPIC - Abnormal; Notable for the following components:   Protein, ur 100 (*)    Bacteria, UA RARE (*)    All other components within normal limits  PREGNANCY, URINE  HEPATIC FUNCTION PANEL  LIPASE, BLOOD    EKG None  Radiology CT ABDOMEN PELVIS WO CONTRAST  Result Date: 11/06/2021 CLINICAL DATA:  Right flank pain x2 days, history of kidney stones EXAM: CT ABDOMEN AND PELVIS WITHOUT CONTRAST TECHNIQUE: Multidetector CT imaging of the abdomen and pelvis was performed following the standard protocol without IV contrast. RADIATION DOSE REDUCTION: This exam was performed according to the departmental dose-optimization program which includes automated exposure control, adjustment of the mA and/or kV according to patient size and/or use of iterative reconstruction  technique. COMPARISON:  None. FINDINGS: Lower chest: Mild scarring/atelectasis at the left lung base. Hepatobiliary: Unenhanced liver is notable for moderate hepatic steatosis with focal fatty sparing along the gallbladder fossa. Gallbladder is unremarkable. No intrahepatic or extrahepatic ductal dilatation. Pancreas: Within normal limits. Spleen: Within normal limits. Adrenals/Urinary Tract: Adrenal glands are within normal limits. Severe left renal cortical scarring/atrophy with medullary nephrocalcinosis bilaterally. 4 mm nonobstructing right lower pole renal calculus (series 2/image 45). No hydronephrosis. Bladder is within normal limits. Stomach/Bowel: Stomach is within normal limits. No evidence of bowel obstruction. Appendix is not discretely visualized, reportedly surgically absent. No colonic wall  thickening or inflammatory changes. Vascular/Lymphatic: No evidence of abdominal aortic aneurysm. No suspicious abdominopelvic lymphadenopathy. Reproductive: Uterus is within normal limits. Bilateral ovaries are within normal limits. Other: No abdominopelvic ascites. Diastasis of the midline anterior abdominal wall. Musculoskeletal: Mild degenerative changes of the visualized thoracolumbar spine. IMPRESSION: 4 mm nonobstructing right lower pole renal calculus. No hydronephrosis. 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 No evidence of bowel obstruction.  Prior appendectomy. Moderate hepatic steatosis. Electronically Signed   By: Julian Hy M.D.   On: 11/06/2021 19:21    Procedures Procedures    Medications Ordered in ED Medications  HYDROmorphone (DILAUDID) injection 1 mg (1 mg Intravenous Given 11/06/21 1814)  ondansetron (ZOFRAN) injection 4 mg (4 mg Intravenous Given 11/06/21 1814)  HYDROmorphone (DILAUDID) injection 1 mg (1 mg Intravenous Given 11/06/21 1942)  metoCLOPramide (REGLAN) injection 10 mg (10 mg Intravenous Given 11/06/21 1942)    ED Course/  Medical Decision Making/ A&P                           Medical Decision Making Amount and/or Complexity of Data Reviewed Labs: ordered. Radiology: ordered.  Risk Prescription drug management.   This patient presents to the ED for concern of right flank pain and right periumbilical pain, this involves an extensive number of treatment options, and is a complaint that carries with it a high risk of complications and morbidity.  The differential diagnosis includes recurrent kidney stone, appendicitis, recurrent small bowel obstruction, ileus, pyelonephritis, MSK   Co morbidities that complicate the patient evaluation  Elevated BMI, recurrent kidney stones, kidney disease, recurrent small bowel obstruction   Additional history obtained:  Additional history obtained from prior medical records    Lab Tests:  I Ordered, and personally interpreted labs.  The pertinent results include: No leukocytosis, hemoglobin unremarkable.  Chemistry show elevated serum creatinine of 2.08.  Mildly elevated from baseline.  GFR of 32.  History of chronic kidney disease   Imaging Studies ordered:  I ordered imaging studies including CT abdomen pelvis I independently visualized and interpreted imaging which showed no evidence of ureteral stone or hydronephrosis.  No evidence of bowel obstruction prior appendectomy I agree with the radiologist interpretation    Medicines ordered and prescription drug management:  I ordered medication including IV pain medication and antiemetic  Reevaluation of the patient after these medicines showed that the patient symptoms have improved   Test Considered:  CT abdomen pelvis   Critical Interventions:  Parenteral pain medication.    Reevaluation:  After the interventions noted above, I reevaluated the patient and found that they have : Improved.  She has tolerated oral fluid challenge.  No vomiting during ER stay.  I feel that flank pain may be  secondary to musculoskeletal injury as further history information patient did note that she had a twisting injury and felt a "pop" in her back several days ago.   Dispostion:  After consideration of the diagnostic results and the patients response to treatment, I feel that the patent is appropriate for discharge home.  She will follow-up closely with her nephrologist and PCP.  Return precautions discussed.          Final Clinical Impression(s) / ED Diagnoses Final diagnoses:  Flank pain    Rx / DC Orders ED Discharge Orders     None         Bufford Lope 11/06/21 2123    Noemi Chapel, MD 11/07/21 1013

## 2021-11-06 NOTE — ED Notes (Signed)
Ice chips given per request.

## 2022-07-17 ENCOUNTER — Emergency Department (HOSPITAL_COMMUNITY): Payer: Medicaid Other

## 2022-07-17 ENCOUNTER — Emergency Department (HOSPITAL_COMMUNITY)
Admission: EM | Admit: 2022-07-17 | Discharge: 2022-07-17 | Disposition: A | Discharge status: Home / Self Care | Payer: Medicaid Other | Attending: Emergency Medicine | Admitting: Emergency Medicine

## 2022-07-17 ENCOUNTER — Other Ambulatory Visit: Payer: Self-pay

## 2022-07-17 ENCOUNTER — Encounter (HOSPITAL_COMMUNITY): Payer: Self-pay

## 2022-07-17 DIAGNOSIS — R112 Nausea with vomiting, unspecified: Secondary | ICD-10-CM | POA: Diagnosis not present

## 2022-07-17 DIAGNOSIS — R109 Unspecified abdominal pain: Secondary | ICD-10-CM | POA: Diagnosis present

## 2022-07-17 DIAGNOSIS — G8929 Other chronic pain: Secondary | ICD-10-CM | POA: Insufficient documentation

## 2022-07-17 DIAGNOSIS — Z87442 Personal history of urinary calculi: Secondary | ICD-10-CM | POA: Insufficient documentation

## 2022-07-17 DIAGNOSIS — R319 Hematuria, unspecified: Secondary | ICD-10-CM | POA: Insufficient documentation

## 2022-07-17 LAB — URINALYSIS, ROUTINE W REFLEX MICROSCOPIC
Bilirubin Urine: NEGATIVE
Glucose, UA: NEGATIVE mg/dL
Hgb urine dipstick: NEGATIVE
Ketones, ur: NEGATIVE mg/dL
Nitrite: NEGATIVE
Protein, ur: 30 mg/dL — AB
Specific Gravity, Urine: 1.005 (ref 1.005–1.030)
pH: 8 (ref 5.0–8.0)

## 2022-07-17 LAB — COMPREHENSIVE METABOLIC PANEL
ALT: 23 U/L (ref 0–44)
AST: 17 U/L (ref 15–41)
Albumin: 3.8 g/dL (ref 3.5–5.0)
Alkaline Phosphatase: 65 U/L (ref 38–126)
Anion gap: 7 (ref 5–15)
BUN: 18 mg/dL (ref 6–20)
CO2: 18 mmol/L — ABNORMAL LOW (ref 22–32)
Calcium: 8.9 mg/dL (ref 8.9–10.3)
Chloride: 114 mmol/L — ABNORMAL HIGH (ref 98–111)
Creatinine, Ser: 2.09 mg/dL — ABNORMAL HIGH (ref 0.44–1.00)
GFR, Estimated: 32 mL/min — ABNORMAL LOW (ref >60)
Glucose, Bld: 136 mg/dL — ABNORMAL HIGH (ref 70–99)
Potassium: 3.8 mmol/L (ref 3.5–5.1)
Sodium: 139 mmol/L (ref 135–145)
Total Bilirubin: 0.6 mg/dL (ref 0.3–1.2)
Total Protein: 7.9 g/dL (ref 6.5–8.1)

## 2022-07-17 LAB — CBC
HCT: 43.3 % (ref 36.0–46.0)
Hemoglobin: 13.7 g/dL (ref 12.0–15.0)
MCH: 26 pg (ref 26.0–34.0)
MCHC: 31.6 g/dL (ref 30.0–36.0)
MCV: 82.3 fL (ref 80.0–100.0)
Platelets: 245 10*3/uL (ref 150–400)
RBC: 5.26 MIL/uL — ABNORMAL HIGH (ref 3.87–5.11)
RDW: 16.1 % — ABNORMAL HIGH (ref 11.5–15.5)
WBC: 10.5 10*3/uL (ref 4.0–10.5)
nRBC: 0 % (ref 0.0–0.2)

## 2022-07-17 LAB — PREGNANCY, URINE: Preg Test, Ur: NEGATIVE

## 2022-07-17 LAB — LIPASE, BLOOD: Lipase: 48 U/L (ref 11–51)

## 2022-07-17 MED ORDER — ACETAMINOPHEN 500 MG PO TABS: 1000.0000 mg | ORAL_TABLET | Freq: Four times a day (QID) | ORAL | 0 refills | Status: AC | PRN

## 2022-07-17 MED ORDER — ONDANSETRON HCL 4 MG/2ML IJ SOLN
4.0000 mg | Freq: Once | INTRAMUSCULAR | Status: AC
Start: 2022-07-17 — End: 2022-07-17
  Administered 2022-07-17: 4 mg via INTRAVENOUS
  Filled 2022-07-17: qty 2

## 2022-07-17 MED ORDER — MORPHINE SULFATE (PF) 4 MG/ML IV SOLN
4.0000 mg | Freq: Once | INTRAVENOUS | Status: AC
  Administered 2022-07-17: 4 mg via INTRAVENOUS
  Filled 2022-07-17: qty 1

## 2022-07-17 MED ORDER — SODIUM CHLORIDE 0.9 % IV BOLUS
1000.0000 mL | Freq: Once | INTRAVENOUS | Status: AC
Start: 2022-07-17 — End: 2022-07-17
  Administered 2022-07-17: 1000 mL via INTRAVENOUS

## 2022-07-17 NOTE — ED Notes (Signed)
2 RN's unsuccessful at IV insertion. Charge RN Vaughan Basta at bedside to attempt.

## 2022-07-17 NOTE — ED Notes (Signed)
Lab called to get update on urine pregnancy.

## 2022-07-17 NOTE — ED Provider Notes (Signed)
[facility] EMERGENCY DEPARTMENT Provider Note   CSN: [id number] Arrival date & time: 07/17/22  1059     History  Chief Complaint  Patient presents with   Nausea   Emesis   Hematuria   Abdominal Pain    Chelsea [name] is a 31 y.o. female.  The history is provided by the patient and medical records. No language interpreter was used.  Emesis Associated symptoms: abdominal pain   Hematuria Associated symptoms include abdominal pain.  Abdominal Pain Associated symptoms: hematuria and vomiting      31 year old female significant history of of kidney stone, chronic abdominal pain, chronic flank pain presenting with complaints of abdominal pain.  Patient report for the past 4 days she has had pain to her right flank and abdomen.  Pain is sharp stabbing persistent and felt similar to prior kidney stone.  She also endorsed nausea and vomiting.  She endorsed some chills without fever.  She did notice some blood in her urine.  She denies any burning urination or any vaginal discharge.  She mentioned her symptoms felt quite similar to prior kidney stone.  No chest pain shortness of breath productive cough or other cold symptoms.  She is unable to recall her last menstrual period.  Nothing seems to make the pain better or worse.  She has been taking Tylenol at home without relief.  Home Medications Prior to Admission medications   Medication Sig Start Date End Date Taking? Authorizing Provider  acetaminophen (TYLENOL) 500 MG tablet Take 500-1,000 mg by mouth every 6 (six) hours as needed for headache.    [provider]  ferrous sulfate 325 (65 FE) MG tablet Take 325 mg by mouth at bedtime.    [provider]  furosemide (LASIX) 20 MG tablet Take 20 mg by mouth daily. 10/16/21   [provider]  HUMIRA PEN 40 MG/0.4ML PNKT SMARTSIG:40 Milligram(s) SUB-Q Once a Week 10/16/21   [provider]  HYDROcodone-acetaminophen (NORCO/VICODIN) 5-325 MG tablet  Take one tab po q 4 hrs prn pain 11/06/21   Triplett, Tammy, PA-C  MAGNESIUM PO Take 1 tablet by mouth daily.    [provider]  metoCLOPramide (REGLAN) 10 MG tablet Take 1 tablet (10 mg total) by mouth every 6 (six) hours as needed for nausea. 11/06/21   Triplett, Tammy, PA-C  pantoprazole (PROTONIX) 40 MG tablet Take 40 mg by mouth 2 (two) times daily.     [provider]  potassium citrate (UROCIT-K) 10 MEQ (1080 MG) SR tablet Take 40 mEq by mouth 2 (two) times daily.    [provider]      Allergies    Patient has no known allergies.    Review of Systems   Review of Systems  Gastrointestinal:  Positive for abdominal pain and vomiting.  Genitourinary:  Positive for hematuria.  All other systems reviewed and are negative.   Physical Exam Updated Vital Signs BP (!) 160/90 (BP Location: Right Arm)   Pulse 81   Temp 98.3 F (36.8 C) (Oral)   Resp 18   Ht 4\' 11"  (1.499 m)   Wt (!) 156 kg   SpO2 100%   BMI 69.48 kg/m  Physical Exam Vitals and nursing note reviewed.  Constitutional:      General: She is not in acute distress.    Appearance: She is well-developed. She is obese.  HENT:     Head: Atraumatic.  Eyes:     Conjunctiva/sclera: Conjunctivae normal.  Cardiovascular:  Rate and Rhythm: Normal rate and regular rhythm.  Pulmonary:     Effort: Pulmonary effort is normal.  Abdominal:     General: Abdomen is flat. Bowel sounds are normal.     Tenderness: There is no abdominal tenderness. There is no right CVA tenderness or left CVA tenderness.  Musculoskeletal:     Cervical back: Neck supple.  Skin:    Findings: No rash.  Neurological:     Mental Status: She is alert.  Psychiatric:        Mood and Affect: Mood normal.     ED Results / Procedures / Treatments   Labs (all labs ordered are listed, but only abnormal results are displayed) Labs Reviewed  COMPREHENSIVE METABOLIC PANEL - Abnormal; Notable for the following components:       Result Value   Chloride 114 (*)    CO2 18 (*)    Glucose, Bld 136 (*)    Creatinine, Ser 2.09 (*)    GFR, Estimated 32 (*)    All other components within normal limits  CBC - Abnormal; Notable for the following components:   RBC 5.26 (*)    RDW 16.1 (*)    All other components within normal limits  URINALYSIS, ROUTINE W REFLEX MICROSCOPIC - Abnormal; Notable for the following components:   Color, Urine STRAW (*)    APPearance CLOUDY (*)    Protein, ur 30 (*)    Leukocytes,Ua TRACE (*)    Bacteria, UA FEW (*)    All other components within normal limits  LIPASE, BLOOD  PREGNANCY, URINE    EKG None  Radiology CT Renal Stone Study  Result Date: 07/17/2022 CLINICAL DATA:  4 days of abdominal pain, hematuria EXAM: CT ABDOMEN AND PELVIS WITHOUT CONTRAST TECHNIQUE: Multidetector CT imaging of the abdomen and pelvis was performed following the standard protocol without IV contrast. RADIATION DOSE REDUCTION: This exam was performed according to the departmental dose-optimization program which includes automated exposure control, adjustment of the mA and/or kV according to patient size and/or use of iterative reconstruction technique. COMPARISON:  Multiple priors including most recent CT November 06, 2021. FINDINGS: Lower chest: Mild scarring/atelectasis in the left lung base. Hepatobiliary: Diffuse hepatic steatosis. Gallbladder is unremarkable. No biliary ductal dilation. Pancreas: No pancreatic ductal dilation or evidence of acute inflammation. Spleen: No splenomegaly. Adrenals/Urinary Tract: Bilateral adrenal glands appear normal. Similar severe left renal atrophy. Medullary nephrocalcinosis. Bilateral nonobstructive renal calculi measure up to 5 mm in the right kidney. No obstructive ureteral or bladder calculi. Urinary bladder is unremarkable for degree of distension. Stomach/Bowel: Stomach is unremarkable for degree of distension. No pathologic dilation of small or large bowel. Appendix  is not confidently identified however there is no pericecal inflammation. No evidence of acute bowel inflammation Vascular/Lymphatic: Normal caliber abdominal aorta. Prominent retroperitoneal lymph nodes are unchanged from prior Reproductive: Uterus and bilateral adnexa are unremarkable. Other: Similar diastasis of the abdominal wall with fat containing a nonobstructed bowel containing ventral hernias. Musculoskeletal: No acute osseous abnormality. IMPRESSION: 1. Bilateral nonobstructive renal calculi measure up to 5 mm in the right kidney. No obstructive ureteral or bladder calculi. 2. Medullary nephrocalcinosis with similar severe left renal atrophy. 3. Diffuse hepatic steatosis. Electronically Signed   By: Dahlia Bailiff M.D.   On: 07/17/2022 15:14    Procedures Procedures    Medications Ordered in ED Medications  ondansetron (ZOFRAN) injection 4 mg (4 mg Intravenous Given 07/17/22 1345)  morphine (PF) 4 MG/ML injection 4 mg (4 mg Intravenous Given  07/17/22 1345)  sodium chloride 0.9 % bolus 1,000 mL (1,000 mLs Intravenous New Bag/Given 07/17/22 1345)    ED Course/ Medical Decision Making/ A&P                           Medical Decision Making Amount and/or Complexity of Data Reviewed Labs: ordered. Radiology: ordered.  Risk Prescription drug management.   BP (!) 160/90 (BP Location: Right Arm)   Pulse 81   Temp 98.3 F (36.8 C) (Oral)   Resp 18   Ht 4\' 11"  (1.499 m)   Wt (!) 156 kg   SpO2 100%   BMI 69.48 kg/m   43:24 PM 31 year old female significant history of of kidney stone, chronic abdominal pain, chronic flank pain presenting with complaints of abdominal pain.  Patient report for the past 4 days she has had pain to her right flank and abdomen.  Pain is sharp stabbing persistent and felt similar to prior kidney stone.  She also endorsed nausea and vomiting.  She endorsed some chills without fever.  She did notice some blood in her urine.  She denies any burning urination  or any vaginal discharge.  She mentioned her symptoms felt quite similar to prior kidney stone.  No chest pain shortness of breath productive cough or other cold symptoms.  She is unable to recall her last menstrual period.  Nothing seems to make the pain better or worse.  She has been taking Tylenol at home without relief.  On exam this is an obese female sitting in bed appears to be in no acute discomfort.  Heart and lung sounds normal.  No significant abdominal tenderness and no obvious CVA tenderness.  Vitals are remarkable for blood pressure of 160/90.  Elevated blood pressures likely secondary to pain.  She does not have any document history of hypertension.  She is afebrile, no hypoxia.  Given her presentation, will obtain labs, pregnancy test, and will obtain CT scan to rule out obstructive kidney stone.  Patient will receive IV fluid, antinausea medication, opiate pain medication.  4:10 PM Labs and imaging obtained independently viewed interpreted by me.  I agree with radiology interpretation.  Patient does have evidence of renal dysfunction with a creatinine of 2.09 similar to baseline.  Normal WBC, normal H&H.  Urinalysis without signs of urine tract infection.  Her pregnancy test is negative.  CT renal stone study demonstrated nonobstructive bilateral renal calculi but no other acute emergent medical condition noted on CT.  I encouraged patient to take Tylenol as needed for pain and she may follow-up with her PCP for further care.  Return precaution given.  I have considered admission however in the setting of no acute emergent medical condition identified, and patient with improvement of symptoms with initial dose of pain medication I felt she is stable to be discharged home.  I have reviewed patient ED care plan and considered my plan of care as well as reviewed in her EMR.  Patient did report improvement of her symptoms with treatment here including morphine.  I have considered on the  differential kidney stone, cholecystitis, pancreatitis, appendicitis, pyelonephritis, obstructive kidney stone, ectopic pregnancy but felt these are less likely.        Final Clinical Impression(s) / ED Diagnoses Final diagnoses:  Chronic abdominal pain    Rx / DC Orders ED Discharge Orders          Ordered    acetaminophen (TYLENOL) 500 MG tablet  Every 6 hours PRN        07/17/22 1616              Fayrene Helper, PA-C 07/17/22 1616    Bethann Berkshire, MD 07/19/22 [phone]

## 2022-07-17 NOTE — ED Triage Notes (Signed)
Pt reports right lower abdomen pain that started 4 days ago. N/v x2 days. Blood in urine for "a couple of days."

## 2023-03-07 ENCOUNTER — Emergency Department (HOSPITAL_COMMUNITY): Payer: Medicaid Other

## 2023-03-07 ENCOUNTER — Encounter (HOSPITAL_COMMUNITY): Payer: Self-pay

## 2023-03-07 ENCOUNTER — Other Ambulatory Visit: Payer: Self-pay

## 2023-03-07 ENCOUNTER — Emergency Department (HOSPITAL_COMMUNITY)
Admission: EM | Admit: 2023-03-07 | Discharge: 2023-03-07 | Disposition: A | Discharge status: Home / Self Care | Payer: Medicaid Other | Attending: Emergency Medicine | Admitting: Emergency Medicine

## 2023-03-07 DIAGNOSIS — K439 Ventral hernia without obstruction or gangrene: Secondary | ICD-10-CM

## 2023-03-07 DIAGNOSIS — R1031 Right lower quadrant pain: Secondary | ICD-10-CM | POA: Diagnosis present

## 2023-03-07 DIAGNOSIS — Z79899 Other long term (current) drug therapy: Secondary | ICD-10-CM | POA: Diagnosis not present

## 2023-03-07 DIAGNOSIS — E876 Hypokalemia: Secondary | ICD-10-CM | POA: Insufficient documentation

## 2023-03-07 DIAGNOSIS — E860 Dehydration: Secondary | ICD-10-CM | POA: Diagnosis not present

## 2023-03-07 DIAGNOSIS — N189 Chronic kidney disease, unspecified: Secondary | ICD-10-CM | POA: Insufficient documentation

## 2023-03-07 DIAGNOSIS — N3001 Acute cystitis with hematuria: Secondary | ICD-10-CM | POA: Diagnosis not present

## 2023-03-07 DIAGNOSIS — K458 Other specified abdominal hernia without obstruction or gangrene: Secondary | ICD-10-CM | POA: Insufficient documentation

## 2023-03-07 DIAGNOSIS — R112 Nausea with vomiting, unspecified: Secondary | ICD-10-CM

## 2023-03-07 LAB — CBC WITH DIFFERENTIAL/PLATELET
Abs Immature Granulocytes: 0.14 10*3/uL — ABNORMAL HIGH (ref 0.00–0.07)
Basophils Absolute: 0.1 10*3/uL (ref 0.0–0.1)
Basophils Relative: 1 %
Eosinophils Absolute: 0 10*3/uL (ref 0.0–0.5)
Eosinophils Relative: 0 %
HCT: 39.9 % (ref 36.0–46.0)
Hemoglobin: 12 g/dL (ref 12.0–15.0)
Immature Granulocytes: 1 %
Lymphocytes Relative: 18 %
Lymphs Abs: 1.9 10*3/uL (ref 0.7–4.0)
MCH: 24.9 pg — ABNORMAL LOW (ref 26.0–34.0)
MCHC: 30.1 g/dL (ref 30.0–36.0)
MCV: 83 fL (ref 80.0–100.0)
Monocytes Absolute: 0.7 10*3/uL (ref 0.1–1.0)
Monocytes Relative: 6 %
Neutro Abs: 7.6 10*3/uL (ref 1.7–7.7)
Neutrophils Relative %: 74 %
Platelets: 261 10*3/uL (ref 150–400)
RBC: 4.81 MIL/uL (ref 3.87–5.11)
RDW: 17.7 % — ABNORMAL HIGH (ref 11.5–15.5)
WBC: 10.4 10*3/uL (ref 4.0–10.5)
nRBC: 0 % (ref 0.0–0.2)

## 2023-03-07 LAB — URINALYSIS, ROUTINE W REFLEX MICROSCOPIC
Bilirubin Urine: NEGATIVE
Glucose, UA: NEGATIVE mg/dL
Ketones, ur: NEGATIVE mg/dL
Nitrite: NEGATIVE
Protein, ur: NEGATIVE mg/dL
Specific Gravity, Urine: 1.004 — ABNORMAL LOW (ref 1.005–1.030)
pH: 8 (ref 5.0–8.0)

## 2023-03-07 LAB — COMPREHENSIVE METABOLIC PANEL
ALT: 14 U/L (ref 0–44)
AST: 9 U/L — ABNORMAL LOW (ref 15–41)
Albumin: 3.4 g/dL — ABNORMAL LOW (ref 3.5–5.0)
Alkaline Phosphatase: 63 U/L (ref 38–126)
Anion gap: 7 (ref 5–15)
BUN: 22 mg/dL — ABNORMAL HIGH (ref 6–20)
CO2: 19 mmol/L — ABNORMAL LOW (ref 22–32)
Calcium: 8.2 mg/dL — ABNORMAL LOW (ref 8.9–10.3)
Chloride: 111 mmol/L (ref 98–111)
Creatinine, Ser: 2.14 mg/dL — ABNORMAL HIGH (ref 0.44–1.00)
GFR, Estimated: 31 mL/min — ABNORMAL LOW (ref >60)
Glucose, Bld: 94 mg/dL (ref 70–99)
Potassium: 3.4 mmol/L — ABNORMAL LOW (ref 3.5–5.1)
Sodium: 137 mmol/L (ref 135–145)
Total Bilirubin: 0.7 mg/dL (ref 0.3–1.2)
Total Protein: 7.4 g/dL (ref 6.5–8.1)

## 2023-03-07 LAB — I-STAT BETA HCG BLOOD, ED (MC, WL, AP ONLY): I-stat hCG, quantitative: <5 m[IU]/mL (ref <5)

## 2023-03-07 LAB — RAPID URINE DRUG SCREEN, HOSP PERFORMED
Amphetamines: NOT DETECTED
Barbiturates: NOT DETECTED
Benzodiazepines: NOT DETECTED
Cocaine: NOT DETECTED
Opiates: NOT DETECTED
Tetrahydrocannabinol: NOT DETECTED

## 2023-03-07 LAB — LIPASE, BLOOD: Lipase: 53 U/L — ABNORMAL HIGH (ref 11–51)

## 2023-03-07 MED ORDER — POTASSIUM CHLORIDE CRYS ER 20 MEQ PO TBCR
40.0000 meq | EXTENDED_RELEASE_TABLET | Freq: Once | ORAL | Status: AC
  Administered 2023-03-07: 40 meq via ORAL
  Filled 2023-03-07: qty 2

## 2023-03-07 MED ORDER — CEPHALEXIN 500 MG PO CAPS: 500.0000 mg | ORAL_CAPSULE | Freq: Three times a day (TID) | ORAL | 0 refills | Status: AC

## 2023-03-07 MED ORDER — LACTATED RINGERS IV BOLUS
1000.0000 mL | Freq: Once | INTRAVENOUS | Status: AC
  Administered 2023-03-07: 1000 mL via INTRAVENOUS

## 2023-03-07 MED ORDER — OXYCODONE HCL 5 MG PO TABS: 5.0000 mg | ORAL_TABLET | Freq: Four times a day (QID) | ORAL | 0 refills | Status: AC | PRN

## 2023-03-07 MED ORDER — METOCLOPRAMIDE HCL 5 MG/ML IJ SOLN
10.0000 mg | Freq: Once | INTRAMUSCULAR | Status: AC
  Administered 2023-03-07: 10 mg via INTRAVENOUS
  Filled 2023-03-07: qty 2

## 2023-03-07 MED ORDER — METOCLOPRAMIDE HCL 10 MG PO TABS: 10.0000 mg | ORAL_TABLET | Freq: Three times a day (TID) | ORAL | 0 refills | Status: AC | PRN

## 2023-03-07 MED ORDER — SODIUM CHLORIDE 0.9 % IV SOLN
1.0000 g | Freq: Once | INTRAVENOUS | Status: AC
  Administered 2023-03-07: 1 g via INTRAVENOUS
  Filled 2023-03-07: qty 10

## 2023-03-07 MED ORDER — DIPHENHYDRAMINE HCL 50 MG/ML IJ SOLN
25.0000 mg | Freq: Once | INTRAMUSCULAR | Status: AC
  Administered 2023-03-07: 25 mg via INTRAVENOUS
  Filled 2023-03-07: qty 1

## 2023-03-07 MED ORDER — MORPHINE SULFATE (PF) 4 MG/ML IV SOLN
4.0000 mg | Freq: Once | INTRAVENOUS | Status: AC
  Administered 2023-03-07: 4 mg via INTRAVENOUS
  Filled 2023-03-07: qty 1

## 2023-03-07 MED ORDER — HYDROMORPHONE HCL 1 MG/ML IJ SOLN
1.0000 mg | Freq: Once | INTRAMUSCULAR | Status: AC
  Administered 2023-03-07: 1 mg via INTRAVENOUS
  Filled 2023-03-07: qty 1

## 2023-03-07 NOTE — ED Triage Notes (Signed)
Pt comes from home c/o vomiting x 3-4 days, abdominal pain, and right side pain. Took tylenol and zofran at home with no relief

## 2023-03-07 NOTE — ED Provider Notes (Signed)
[place] EMERGENCY DEPARTMENT AT [facility] Provider Note   CSN: [id number] Arrival date & time: 03/07/23  4098     History  Chief Complaint  Patient presents with   Abdominal Pain   Emesis    Chelsea [name] is a 32 y.o. female with past medical history of rickets disease, kidney stones, chronic pain, ventral hernia, CKD who presents to the ED complaining of right lower quadrant abdominal pain, nausea, and vomiting for the last 3 to 4 days.  Patient endorses 2-3 episodes of nonbloody emesis daily until this morning when she has only had dry heaves.  She has been taking Zofran and Tylenol at home without relief of symptoms.  Also states that last night she had an episode of gross hematuria.  States that symptoms feel similar to history of kidney stone.  No dysuria, change in urinary output, fever, chills, diarrhea, vaginal bleeding or discharge.  Denies chance of pregnancy.      Home Medications Prior to Admission medications   Medication Sig Start Date End Date Taking? Authorizing Provider  acetaminophen (TYLENOL) 500 MG tablet Take 2 tablets (1,000 mg total) by mouth every 6 (six) hours as needed for moderate pain. 07/17/22  Yes Fayrene Helper, PA-C  cephALEXin (KEFLEX) 500 MG capsule Take 1 capsule (500 mg total) by mouth 3 (three) times daily for 5 days. 03/07/23 03/12/23 Yes Rebekkah Powless L, PA-C  COSENTYX UNOREADY 300 MG/2ML SOAJ Inject 300 mg into the skin every 30 (thirty) days. 03/03/23  Yes [provider]  [employer] MELATONIN 5 MG TABS Take 5 mg by mouth at bedtime as needed (sleep aid). 02/09/23  Yes [provider]  ferrous sulfate 325 (65 FE) MG tablet Take 325 mg by mouth at bedtime.   Yes [provider]  furosemide (LASIX) 20 MG tablet Take 20 mg by mouth daily. 10/16/21  Yes [provider]  medroxyPROGESTERone (PROVERA) 10 MG tablet Take 10 mg by mouth daily. 04/29/22  Yes [provider]  metoCLOPramide (REGLAN) 10 MG  tablet Take 1 tablet (10 mg total) by mouth every 8 (eight) hours as needed for up to 3 days for nausea or vomiting. 03/07/23 03/10/23 Yes Chequita Mofield L, PA-C  minocycline (MINOCIN) 50 MG capsule Take 50 mg by mouth daily. 02/21/22  Yes [provider]  oxyCODONE (ROXICODONE) 5 MG immediate release tablet Take 1 tablet (5 mg total) by mouth every 6 (six) hours as needed for up to 3 days for severe pain or breakthrough pain. 03/07/23 03/10/23 Yes Kritika Stukes L, PA-C  pantoprazole (PROTONIX) 40 MG tablet Take 40 mg by mouth 2 (two) times daily.    Yes [provider]  potassium citrate (UROCIT-K) 10 MEQ (1080 MG) SR tablet Take 40 mEq by mouth 2 (two) times daily.   Yes [provider]      Allergies    Patient has no known allergies.    Review of Systems   Review of Systems  All other systems reviewed and are negative.   Physical Exam Updated Vital Signs BP 135/88   Pulse 89   Temp 98.2 F (36.8 C) (Oral)   Resp 17   Ht 4\' 11"  (1.499 m)   Wt 129.3 kg   SpO2 98%   BMI 57.56 kg/m  Physical Exam Vitals and nursing note reviewed.  Constitutional:      General: She is in acute distress (mild secondary to pain).     Appearance: Normal appearance.  HENT:  Head: Normocephalic and atraumatic.     Mouth/Throat:     Comments: Moderately dry mucus membranes Eyes:     Conjunctiva/sclera: Conjunctivae normal.  Cardiovascular:     Rate and Rhythm: Normal rate and regular rhythm.     Heart sounds: No murmur heard. Pulmonary:     Effort: Pulmonary effort is normal. No respiratory distress.     Breath sounds: Normal breath sounds. No stridor. No wheezing, rhonchi or rales.  Abdominal:     General: Abdomen is protuberant.     Tenderness: There is abdominal tenderness in the right lower quadrant. There is right CVA tenderness. There is no left CVA tenderness, guarding or rebound. Negative signs include Murphy's sign, Rovsing's sign and McBurney's sign.   Musculoskeletal:        General: Normal range of motion.     Cervical back: Neck supple.     Right lower leg: No edema.     Left lower leg: No edema.  Skin:    General: Skin is warm and dry.     Capillary Refill: Capillary refill takes less than 2 seconds.  Neurological:     Mental Status: She is alert. Mental status is at baseline.  Psychiatric:        Mood and Affect: Mood normal.        Behavior: Behavior normal.     ED Results / Procedures / Treatments   Labs (all labs ordered are listed, but only abnormal results are displayed) Labs Reviewed  CBC WITH DIFFERENTIAL/PLATELET - Abnormal; Notable for the following components:      Result Value   MCH 24.9 (*)    RDW 17.7 (*)    Abs Immature Granulocytes 0.14 (*)    All other components within normal limits  COMPREHENSIVE METABOLIC PANEL - Abnormal; Notable for the following components:   Potassium 3.4 (*)    CO2 19 (*)    BUN 22 (*)    Creatinine, Ser 2.14 (*)    Calcium 8.2 (*)    Albumin 3.4 (*)    AST 9 (*)    GFR, Estimated 31 (*)    All other components within normal limits  LIPASE, BLOOD - Abnormal; Notable for the following components:   Lipase 53 (*)    All other components within normal limits  URINALYSIS, ROUTINE W REFLEX MICROSCOPIC - Abnormal; Notable for the following components:   Color, Urine STRAW (*)    Specific Gravity, Urine 1.004 (*)    Hgb urine dipstick MODERATE (*)    Leukocytes,Ua SMALL (*)    Bacteria, UA RARE (*)    All other components within normal limits  RAPID URINE DRUG SCREEN, HOSP PERFORMED  I-STAT BETA HCG BLOOD, ED ([facility], [facility], [facility] ONLY)    EKG EKG Interpretation  Date/Time:  Monday March 07 2023 10:43:58 EDT Ventricular Rate:  76 PR Interval:  154 QRS Duration: 84 QT Interval:  377 QTC Calculation: 424 R Axis:   81 Text Interpretation: Sinus rhythm Low voltage, precordial leads Borderline T abnormalities, anterior leads Baseline wander in lead(s) II III aVF Confirmed by  Pricilla Loveless [phone]) on 03/07/2023 11:09:58 AM  Radiology CT ABDOMEN PELVIS WO CONTRAST  Result Date: 03/07/2023 CLINICAL DATA:  Right abdominal/flank pain. Vomiting for 3-4 days. Hematuria. EXAM: CT ABDOMEN AND PELVIS WITHOUT CONTRAST TECHNIQUE: Multidetector CT imaging of the abdomen and pelvis was performed following the standard protocol without IV contrast. RADIATION DOSE REDUCTION: This exam was performed according to the departmental dose-optimization program which includes  automated exposure control, adjustment of the mA and/or kV according to patient size and/or use of iterative reconstruction technique. COMPARISON:  09/26/2022 FINDINGS: Body habitus reduces diagnostic sensitivity and specificity. Lower chest: Mild cardiomegaly. Mild scarring in the left lower lobe Hepatobiliary: Unremarkable Pancreas: Unremarkable Spleen: Unremarkable Adrenals/Urinary Tract: Both adrenal glands appear normal. Severe left atrophy with scattered parenchymal calcifications in the minimal amount of remaining tissue. 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 Proximally four nonobstructive right renal calculi, the largest in the lower pole measuring 0.7 cm in long axis. No ureteral or bladder calculus observed. No hydronephrosis. Stomach/Bowel: Rectus diastasis containing transverse colon loops of small bowel. Abutting of from this region of rectus diastasis are multiple additional anterior abdominal wall hernias, 3 on the right and at least 2 on the left. The 3 right eccentric hernias contain loops of small bowel. One of the left hernias contains a margin of small bowel. However, there no finding of strangulation or obstruction. No pneumatosis or extraluminal gas. Prior appendectomy. Vascular/Lymphatic: A retroperitoneal lymph node anterior to the abdominal aorta measures 0.9 cm in short axis on image 44 series 2, no change from 09/26/2022. This is upper normal in size. Reproductive: Unremarkable Other: No supplemental  non-categorized findings. Musculoskeletal: Diffuse mild skeletal sclerosis, cannot exclude renal osteodystrophy. Complex anterior abdominal wall hernias as noted above. Ossific projection from the right lesser trochanter along the distal iloipsoas, no change. IMPRESSION: 1. Complex anterior abdominal wall hernia consisting of protuberant rectus diastasis with small hernias abutting off from this region of laxity bilaterally. The rectus diastasis contains transverse colon and the smaller hernias along the region of rectus diastasis contain loops of small bowel, without evidence of strangulation or obstruction. 2. 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 3. Severe left renal atrophy with scattered parenchymal calcifications in the minimal amount of remaining tissue. 4. Mild cardiomegaly. 5. Diffuse mild skeletal sclerosis, possibly from renal osteodystrophy. Electronically Signed   By: Gaylyn Rong M.D.   On: 03/07/2023 12:54   US Renal  Result Date: 03/07/2023 CLINICAL DATA:  Right flank pain for 4 days, history of medullary nephrocalcinosis EXAM: RENAL / URINARY TRACT ULTRASOUND COMPLETE COMPARISON:  CT abdomen pelvis 07/17/2022 FINDINGS: Right Kidney: Renal measurements: 13.2 x 6.2 x 6.3 cm = volume: 269 mL. In the mid to inferior right kidney, there is a shadowing calculus measuring up to 17 mm. Echogenicity within normal limits. No mass or hydronephrosis visualized. Left Kidney: Renal measurements: 11.6 x 5.2 x 5.5 cm = volume: 174 mL. Redemonstrated left renal atrophy. No mass or hydronephrosis visualized. Bladder: Appears normal for degree of bladder distention. Other: None. IMPRESSION: 1. Nonobstructing right renal calculus measuring up to 17 mm. 2. Redemonstrated left renal atrophy. Electronically Signed   By: Wiliam Ke M.D.   On: 03/07/2023 11:24    Procedures Procedures    Medications Ordered in ED Medications  potassium chloride SA (KLOR-CON M)  CR tablet 40 mEq (has no administration in time range)  lactated ringers bolus 1,000 mL (0 mLs Intravenous Stopped 03/07/23 1235)  metoCLOPramide (REGLAN) injection 10 mg (10 mg Intravenous Given 03/07/23 1026)  diphenhydrAMINE (BENADRYL) injection 25 mg (25 mg Intravenous Given 03/07/23 1024)  morphine (PF) 4 MG/ML injection 4 mg (4 mg Intravenous Given 03/07/23 1024)  cefTRIAXone (ROCEPHIN) 1 g in sodium chloride 0.9 % 100 mL IVPB (1 g Intravenous New Bag/Given 03/07/23 1215)  HYDROmorphone (DILAUDID) injection 1 mg (1 mg Intravenous Given 03/07/23 1232)    ED Course/ Medical Decision  Making/ A&P                             Medical Decision Making Amount and/or Complexity of Data Reviewed Labs: ordered. Decision-making details documented in ED Course. Radiology: ordered. Decision-making details documented in ED Course. ECG/medicine tests: ordered. Decision-making details documented in ED Course.  Risk Prescription drug management.   Medical Decision Making:   JAYLIAH EMMITT is a 32 y.o. female who presented to the ED today with abdominal pain detailed above.    Patient's presentation is complicated by their history of Rickets, kidney stones, hernia, obesity.  Complete initial physical exam performed, notably the patient  was in mild distress secondary to pain.  She had right lower quadrant and right CVA tenderness.  Abdomen protuberant.  Nontoxic-appearing.    Reviewed and confirmed nursing documentation for past medical history, family history, social history.    Initial Assessment:   With the patient's presentation of abdominal pain, differential diagnosis includes but is not limited to AAA, mesenteric ischemia, appendicitis, diverticulitis, DKA, gastritis, gastroenteritis, AMI, nephrolithiasis, pancreatitis, peritonitis, adrenal insufficiency, intestinal ischemia, constipation, UTI, SBO/LBO, splenic rupture, biliary disease, IBD, IBS, PUD, hepatitis, STD, ovarian/testicular torsion,  electrolyte disturbance, DKA, dehydration, acute kidney injury, renal failure, cholecystitis, cholelithiasis, choledocholithiasis, abdominal pain of  unknown etiology, pregnancy, incomplete abortion, septic abortion, threatened abortion, ectopic pregnancy, PID.   Initial Plan:  Screening labs including CBC and Metabolic panel to evaluate for infectious or metabolic etiology of disease.  Lipase to evaluate for pancreatitis Urinalysis with reflex culture ordered to evaluate for UTI or relevant urologic/nephrologic pathology.  Renal US to assess for pyelo/kidney complication CT abd/pelvis to evaluate for intra-abdominal pathology EKG to evaluate for cardiac pathology Symptomatic management Objective evaluation as reviewed   Initial Study Results:   Laboratory  All laboratory results reviewed without evidence of clinically relevant pathology.   Exceptions include: UA with leukocytes and hemoglobin, potassium 3.4, creatinine 2.14 similar to previous, albumin 3.4, lipase 53, AST 9   Radiology:  All images reviewed independently. Agree with radiology report at this time.   CT ABDOMEN PELVIS WO CONTRAST  Result Date: 03/07/2023 CLINICAL DATA:  Right abdominal/flank pain. Vomiting for 3-4 days. Hematuria. EXAM: CT ABDOMEN AND PELVIS WITHOUT CONTRAST TECHNIQUE: Multidetector CT imaging of the abdomen and pelvis was performed following the standard protocol without IV contrast. RADIATION DOSE REDUCTION: This exam was performed according to the departmental dose-optimization program which includes automated exposure control, adjustment of the mA and/or kV according to patient size and/or use of iterative reconstruction technique. COMPARISON:  09/26/2022 FINDINGS: Body habitus reduces diagnostic sensitivity and specificity. Lower chest: Mild cardiomegaly. Mild scarring in the left lower lobe Hepatobiliary: Unremarkable Pancreas: Unremarkable Spleen: Unremarkable Adrenals/Urinary Tract: Both adrenal glands  appear normal. Severe left atrophy with scattered parenchymal calcifications in the minimal amount of remaining tissue. 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 Proximally four nonobstructive right renal calculi, the largest in the lower pole measuring 0.7 cm in long axis. No ureteral or bladder calculus observed. No hydronephrosis. Stomach/Bowel: Rectus diastasis containing transverse colon loops of small bowel. Abutting of from this region of rectus diastasis are multiple additional anterior abdominal wall hernias, 3 on the right and at least 2 on the left. The 3 right eccentric hernias contain loops of small bowel. One of the left hernias contains a margin of small bowel. However, there no finding of strangulation or obstruction. No pneumatosis or extraluminal gas. Prior appendectomy. Vascular/Lymphatic: A retroperitoneal  lymph node anterior to the abdominal aorta measures 0.9 cm in short axis on image 44 series 2, no change from 09/26/2022. This is upper normal in size. Reproductive: Unremarkable Other: No supplemental non-categorized findings. Musculoskeletal: Diffuse mild skeletal sclerosis, cannot exclude renal osteodystrophy. Complex anterior abdominal wall hernias as noted above. Ossific projection from the right lesser trochanter along the distal iloipsoas, no change. IMPRESSION: 1. Complex anterior abdominal wall hernia consisting of protuberant rectus diastasis with small hernias abutting off from this region of laxity bilaterally. The rectus diastasis contains transverse colon and the smaller hernias along the region of rectus diastasis contain loops of small bowel, without evidence of strangulation or obstruction. 2. 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 3. Severe left renal atrophy with scattered parenchymal calcifications in the minimal amount of remaining tissue. 4. Mild cardiomegaly. 5. Diffuse mild skeletal sclerosis, possibly from renal osteodystrophy.  Electronically Signed   By: Gaylyn Rong M.D.   On: 03/07/2023 12:54   US Renal  Result Date: 03/07/2023 CLINICAL DATA:  Right flank pain for 4 days, history of medullary nephrocalcinosis EXAM: RENAL / URINARY TRACT ULTRASOUND COMPLETE COMPARISON:  CT abdomen pelvis 07/17/2022 FINDINGS: Right Kidney: Renal measurements: 13.2 x 6.2 x 6.3 cm = volume: 269 mL. In the mid to inferior right kidney, there is a shadowing calculus measuring up to 17 mm. Echogenicity within normal limits. No mass or hydronephrosis visualized. Left Kidney: Renal measurements: 11.6 x 5.2 x 5.5 cm = volume: 174 mL. Redemonstrated left renal atrophy. No mass or hydronephrosis visualized. Bladder: Appears normal for degree of bladder distention. Other: None. IMPRESSION: 1. Nonobstructing right renal calculus measuring up to 17 mm. 2. Redemonstrated left renal atrophy. Electronically Signed   By: Wiliam Ke M.D.   On: 03/07/2023 11:24      Consults: Case discussed with surgery, Dr Robyne Peers, who recommended outpatient follow up with hernia specialist, Dr. Barnetta Chapel.   Final Assessment and Plan:   This is a 32 year old female who presents to the ED complaining of abdominal pain, nausea, and vomiting.  She does appear uncomfortable on exam.  Workup initiated as above for further assessment.  No relief with Zofran at home.  Nausea well-controlled with Reglan and Benadryl.  Lab work does show mild hypokalemia which was repleted.  Patient appeared slightly dehydrated so was given fluid bolus.  UA appears acutely infected.  Given dose of Rocephin for this and will send home with Keflex.  Renal ultrasound was completed due to unavailability of CT scan at the beginning of patient's visit and did not show acute emergent condition.  CT eventually performed which showed a complicated hernia.  Discussed case with attending physician on this note who agreed that after review of CT scan surgery should be consulted.  Discussed with surgeon as  above who recommended outpatient follow-up with hernia specialist.  Patient requesting pain medication to go home with.  PDMP reviewed and negative for recent narcotic prescriptions.  Will give small amount of narcotic pain medication for home given complex hernia but patient aware that she should closely follow-up with primary care for additional medication needs.  Will also provide with Reglan to help with nausea at home.  Patient comfortable with discharge home.  Keflex prescribed to treat UTI.  Patient expressed understanding of plan.  Strict ED return precautions given, all questions answered, and stable for discharge   Clinical Impression:  1. Abdominal wall hernia   2. Dehydration   3. Nausea and vomiting, unspecified vomiting  type   4. Hypokalemia   5. Acute cystitis with hematuria      Discharge           Final Clinical Impression(s) / ED Diagnoses Final diagnoses:  Dehydration  Abdominal wall hernia  Nausea and vomiting, unspecified vomiting type  Hypokalemia  Acute cystitis with hematuria    Rx / DC Orders ED Discharge Orders          Ordered    metoCLOPramide (REGLAN) 10 MG tablet  Every 8 hours PRN        03/07/23 1325    oxyCODONE (ROXICODONE) 5 MG immediate release tablet  Every 6 hours PRN        03/07/23 1325    cephALEXin (KEFLEX) 500 MG capsule  3 times daily        03/07/23 1328              Tonette Lederer, PA-C 03/07/23 1333    Pricilla Loveless, MD 03/08/23 [phone]

## 2023-03-07 NOTE — Discharge Instructions (Addendum)
Thank you for letting us take care of you today.   We are treating you for a UTI with antibiotics at home. Your CT scan showed a complex hernia and our surgeon recommended following up with a hernia specialist, Dr. Barnetta Chapel. I am providing you with Reglan for nausea and oxycodone for pain at home over the next few days. Please follow up closely with your PCP for re-evaluation and further medication needs.   For new or worsening symptoms, return to nearest ED for re-evaluation.

## 2024-01-28 ENCOUNTER — Emergency Department (HOSPITAL_COMMUNITY)
Admission: EM | Admit: 2024-01-28 | Discharge: 2024-01-28 | Disposition: A | Discharge status: Home / Self Care | Attending: Emergency Medicine | Admitting: Emergency Medicine

## 2024-01-28 ENCOUNTER — Other Ambulatory Visit: Payer: Self-pay

## 2024-01-28 ENCOUNTER — Emergency Department (HOSPITAL_COMMUNITY)

## 2024-01-28 ENCOUNTER — Encounter (HOSPITAL_COMMUNITY): Payer: Self-pay

## 2024-01-28 DIAGNOSIS — L03311 Cellulitis of abdominal wall: Secondary | ICD-10-CM | POA: Diagnosis not present

## 2024-01-28 DIAGNOSIS — R10A0 Flank pain, unspecified side: Secondary | ICD-10-CM

## 2024-01-28 DIAGNOSIS — R109 Unspecified abdominal pain: Secondary | ICD-10-CM

## 2024-01-28 DIAGNOSIS — N200 Calculus of kidney: Secondary | ICD-10-CM | POA: Diagnosis not present

## 2024-01-28 HISTORY — DX: Hidradenitis suppurativa: L73.2

## 2024-01-28 LAB — COMPREHENSIVE METABOLIC PANEL WITH GFR
ALT: 18 U/L (ref 0–44)
AST: 13 U/L — ABNORMAL LOW (ref 15–41)
Albumin: 3.6 g/dL (ref 3.5–5.0)
Alkaline Phosphatase: 124 U/L (ref 38–126)
Anion gap: 8 (ref 5–15)
BUN: 15 mg/dL (ref 6–20)
CO2: 18 mmol/L — ABNORMAL LOW (ref 22–32)
Calcium: 8.6 mg/dL — ABNORMAL LOW (ref 8.9–10.3)
Chloride: 112 mmol/L — ABNORMAL HIGH (ref 98–111)
Creatinine, Ser: 2.37 mg/dL — ABNORMAL HIGH (ref 0.44–1.00)
GFR, Estimated: 27 mL/min — ABNORMAL LOW (ref >60)
Glucose, Bld: 120 mg/dL — ABNORMAL HIGH (ref 70–99)
Potassium: 3.6 mmol/L (ref 3.5–5.1)
Sodium: 138 mmol/L (ref 135–145)
Total Bilirubin: 0.8 mg/dL (ref 0.0–1.2)
Total Protein: 7.3 g/dL (ref 6.5–8.1)

## 2024-01-28 LAB — URINALYSIS, ROUTINE W REFLEX MICROSCOPIC
Bilirubin Urine: NEGATIVE
Glucose, UA: NEGATIVE mg/dL
Hgb urine dipstick: NEGATIVE
Ketones, ur: NEGATIVE mg/dL
Leukocytes,Ua: NEGATIVE
Nitrite: NEGATIVE
Protein, ur: NEGATIVE mg/dL
Specific Gravity, Urine: 1.004 — ABNORMAL LOW (ref 1.005–1.030)
pH: 8 (ref 5.0–8.0)

## 2024-01-28 LAB — POC URINE PREG, ED: Preg Test, Ur: NEGATIVE

## 2024-01-28 LAB — CBC WITH DIFFERENTIAL/PLATELET
Abs Immature Granulocytes: 0.03 10*3/uL (ref 0.00–0.07)
Basophils Absolute: 0 10*3/uL (ref 0.0–0.1)
Basophils Relative: 0 %
Eosinophils Absolute: 0.1 10*3/uL (ref 0.0–0.5)
Eosinophils Relative: 1 %
HCT: 37.3 % (ref 36.0–46.0)
Hemoglobin: 11.1 g/dL — ABNORMAL LOW (ref 12.0–15.0)
Immature Granulocytes: 1 %
Lymphocytes Relative: 26 %
Lymphs Abs: 1.7 10*3/uL (ref 0.7–4.0)
MCH: 24.1 pg — ABNORMAL LOW (ref 26.0–34.0)
MCHC: 29.8 g/dL — ABNORMAL LOW (ref 30.0–36.0)
MCV: 80.9 fL (ref 80.0–100.0)
Monocytes Absolute: 0.5 10*3/uL (ref 0.1–1.0)
Monocytes Relative: 8 %
Neutro Abs: 4.1 10*3/uL (ref 1.7–7.7)
Neutrophils Relative %: 64 %
Platelets: 206 10*3/uL (ref 150–400)
RBC: 4.61 MIL/uL (ref 3.87–5.11)
RDW: 17.2 % — ABNORMAL HIGH (ref 11.5–15.5)
WBC: 6.4 10*3/uL (ref 4.0–10.5)
nRBC: 0 % (ref 0.0–0.2)

## 2024-01-28 LAB — LIPASE, BLOOD: Lipase: 40 U/L (ref 11–51)

## 2024-01-28 MED ORDER — SODIUM CHLORIDE 0.9 % IV BOLUS
1000.0000 mL | Freq: Once | INTRAVENOUS | Status: AC
  Administered 2024-01-28: 1000 mL via INTRAVENOUS

## 2024-01-28 MED ORDER — SULFAMETHOXAZOLE-TRIMETHOPRIM 800-160 MG PO TABS: 1.0000 | ORAL_TABLET | Freq: Two times a day (BID) | ORAL | 0 refills | Status: AC

## 2024-01-28 MED ORDER — ONDANSETRON HCL 4 MG/2ML IJ SOLN
4.0000 mg | Freq: Once | INTRAMUSCULAR | Status: AC
  Administered 2024-01-28: 4 mg via INTRAVENOUS
  Filled 2024-01-28: qty 2

## 2024-01-28 MED ORDER — FENTANYL CITRATE PF 50 MCG/ML IJ SOSY
25.0000 ug | PREFILLED_SYRINGE | Freq: Once | INTRAMUSCULAR | Status: AC
  Administered 2024-01-28: 25 ug via INTRAVENOUS
  Filled 2024-01-28: qty 1

## 2024-01-28 MED ORDER — HYDROCODONE-ACETAMINOPHEN 5-325 MG PO TABS: 1.0000 | ORAL_TABLET | Freq: Four times a day (QID) | ORAL | 0 refills | Status: AC | PRN

## 2024-01-28 MED ORDER — PREDNISONE 20 MG PO TABS
40.0000 mg | ORAL_TABLET | Freq: Every day | ORAL | 0 refills | Status: AC
Start: 2024-01-28 — End: ?

## 2024-01-28 MED ORDER — HYDROCODONE-ACETAMINOPHEN 5-325 MG PO TABS
1.0000 | ORAL_TABLET | Freq: Once | ORAL | Status: AC
  Administered 2024-01-28: 1 via ORAL
  Filled 2024-01-28: qty 1

## 2024-01-28 MED ORDER — SULFAMETHOXAZOLE-TRIMETHOPRIM 800-160 MG PO TABS
1.0000 | ORAL_TABLET | Freq: Once | ORAL | Status: AC
  Administered 2024-01-28: 1 via ORAL
  Filled 2024-01-28: qty 1

## 2024-01-28 MED ORDER — PREDNISONE 50 MG PO TABS
60.0000 mg | ORAL_TABLET | ORAL | Status: AC
  Administered 2024-01-28: 60 mg via ORAL
  Filled 2024-01-28: qty 1

## 2024-01-28 NOTE — Discharge Instructions (Signed)
 Be sure to speak with your physician on Monday.  Return here for concerning changes in your condition.

## 2024-01-28 NOTE — ED Notes (Signed)
 EDP at bedside during triage

## 2024-01-28 NOTE — ED Provider Notes (Signed)
 [facility] AT [facility] Provider Note   CSN: [id number] Arrival date & time: 01/28/24  0747     History  Chief Complaint  Patient presents with   Emesis   Flank Pain    Chelsea [name] is a 33 y.o. female.  HPI Patient with history of renal tubular acidosis, corrected stones presents with right flank pain.  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  Over the last 3 days she has had pain in the right flank with new nausea, vomiting.  No fever.  No dysuria reported.  Patient also has a rash on her abdomen that has been, generally better in appearance, but persistent as well.    Home Medications Prior to Admission medications   Medication Sig Start Date End Date Taking? Authorizing Provider  HYDROcodone -acetaminophen  (NORCO/VICODIN) 5-325 MG tablet Take 1 tablet by mouth every 6 (six) hours as needed. 01/28/24  Yes Dorenda Gandy, MD  predniSONE (DELTASONE) 20 MG tablet Take 2 tablets (40 mg total) by mouth daily with breakfast. For the next four days 01/28/24  Yes Dorenda Gandy, MD  sulfamethoxazole -trimethoprim  (BACTRIM  DS) 800-160 MG tablet Take 1 tablet by mouth 2 (two) times daily for 7 days. 01/28/24 02/04/24 Yes Dorenda Gandy, MD  acetaminophen  (TYLENOL ) 500 MG tablet Take 2 tablets (1,000 mg total) by mouth every 6 (six) hours as needed for moderate pain. 07/17/22   Debbra Fairy, PA-C  COSENTYX UNOREADY 300 MG/2ML SOAJ Inject 300 mg into the skin every 30 (thirty) days. 03/03/23   [provider]  [facility] MELATONIN 5 MG TABS Take 5 mg by mouth at bedtime as needed (sleep aid). 02/09/23   [provider]  ferrous sulfate 325 (65 FE) MG tablet Take 325 mg by mouth at bedtime.    [provider]  furosemide (LASIX) 20 MG tablet Take 20 mg by mouth daily. 10/16/21   [provider]  medroxyPROGESTERone (PROVERA) 10 MG tablet Take 10 mg by mouth daily. 04/29/22   [provider]  metoCLOPramide   (REGLAN ) 10 MG tablet Take 1 tablet (10 mg total) by mouth every 8 (eight) hours as needed for up to 3 days for nausea or vomiting. 03/07/23 03/10/23  Gowens, Mariah L, PA-C  minocycline (MINOCIN) 50 MG capsule Take 50 mg by mouth daily. 02/21/22   [provider]  pantoprazole  (PROTONIX ) 40 MG tablet Take 40 mg by mouth 2 (two) times daily.     [provider]  potassium citrate  (UROCIT-K ) 10 MEQ (1080 MG) SR tablet Take 40 mEq by mouth 2 (two) times daily.    [provider]      Allergies    Patient has no known allergies.    Review of Systems   Review of Systems  Physical Exam Updated Vital Signs BP (!) 101/52   Pulse 87   Temp 97.9 F (36.6 C) (Oral)   Resp 17   Ht 4\' 11"  (1.499 m)   Wt 133.8 kg   LMP  (LMP Unknown) Comment: birth control stops period  SpO2 100%   BMI 59.56 kg/m  Physical Exam Vitals and nursing note reviewed.  Constitutional:      General: She is not in acute distress.    Appearance: She is well-developed. She is obese. She is not toxic-appearing.  HENT:     Head: Normocephalic and atraumatic.  Eyes:     Conjunctiva/sclera: Conjunctivae normal.  Cardiovascular:     Rate and Rhythm: Normal rate and regular  rhythm.  Pulmonary:     Effort: Pulmonary effort is normal. No respiratory distress.     Breath sounds: Normal breath sounds. No stridor.  Abdominal:     General: There is no distension.     Tenderness: There is no abdominal tenderness. There is no guarding.  Skin:    General: Skin is warm and dry.     Comments: Roughly linear oriented minimally raised slightly erythematous lesion in the right lower abdomen roughly dermatomal in orientation.  Neurological:     Mental Status: She is alert and oriented to person, place, and time.     Cranial Nerves: No cranial nerve deficit.  Psychiatric:        Mood and Affect: Mood normal.     ED Results / Procedures / Treatments   Labs (all labs ordered are listed, but only  abnormal results are displayed) Labs Reviewed  COMPREHENSIVE METABOLIC PANEL WITH GFR - Abnormal; Notable for the following components:      Result Value   Chloride 112 (*)    CO2 18 (*)    Glucose, Bld 120 (*)    Creatinine, Ser 2.37 (*)    Calcium 8.6 (*)    AST 13 (*)    GFR, Estimated 27 (*)    All other components within normal limits  CBC WITH DIFFERENTIAL/PLATELET - Abnormal; Notable for the following components:   Hemoglobin 11.1 (*)    MCH 24.1 (*)    MCHC 29.8 (*)    RDW 17.2 (*)    All other components within normal limits  URINALYSIS, ROUTINE W REFLEX MICROSCOPIC - Abnormal; Notable for the following components:   Color, Urine STRAW (*)    APPearance HAZY (*)    Specific Gravity, Urine 1.004 (*)    All other components within normal limits  LIPASE, BLOOD  POC URINE PREG, ED    EKG None  Radiology CT RENAL STONE STUDY Result Date: 01/28/2024 CLINICAL DATA:  Abdominal flank pain. Right flank pain. Hematuria. History of stones. EXAM: CT ABDOMEN AND PELVIS WITHOUT CONTRAST TECHNIQUE: Multidetector CT imaging of the abdomen and pelvis was performed following the standard protocol without IV contrast. RADIATION DOSE REDUCTION: This exam was performed according to the departmental dose-optimization program which includes automated exposure control, adjustment of the mA and/or kV according to patient size and/or use of iterative reconstruction technique. COMPARISON:  10/21/2023 FINDINGS: Lower chest: No acute findings. Hepatobiliary: No suspicious focal abnormality in the liver on this study without intravenous contrast. There is no evidence for gallstones, gallbladder wall thickening, or pericholecystic fluid. No intrahepatic or extrahepatic biliary dilation. Pancreas: No focal mass lesion. No dilatation of the main duct. No intraparenchymal cyst. No peripancreatic edema. Spleen: No splenomegaly. No suspicious focal mass lesion. Adrenals/Urinary Tract: No adrenal nodule or mass.  Marked atrophy of the left kidney. Diffuse renal parenchymal calcification in the right kidney, similar to prior with scattered nonobstructing right renal stones measuring up to 6 mm in the lower pole. No ureteral or bladder stones. No secondary changes in either kidney or ureter. Stomach/Bowel: Stomach is unremarkable. No gastric wall thickening. No evidence of outlet obstruction. Duodenum is normally positioned as is the ligament of Treitz. No small bowel wall thickening. No small bowel dilatation. The terminal ileum is normal. Nonvisualization of the appendix is consistent with the reported history of appendectomy. No gross colonic mass. No colonic wall thickening. Vascular/Lymphatic: No abdominal aortic aneurysm. No abdominal aortic atherosclerotic calcification. There is no gastrohepatic or hepatoduodenal ligament lymphadenopathy. No  retroperitoneal or mesenteric lymphadenopathy. No pelvic sidewall lymphadenopathy. Borderline enlarged lymph nodes in the groin regions bilaterally are similar to prior and likely reactive. Reproductive: The uterus is unremarkable.  There is no adnexal mass. Other: No intraperitoneal free fluid. 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 No worrisome lytic or sclerotic osseous abnormality. IMPRESSION: 1. No acute findings in the abdomen or pelvis. Specifically, no findings to explain the patient's history of right flank pain and hematuria. 2. Marked atrophy of the left kidney. 3. Diffuse renal parenchymal calcification in the right kidney, similar to prior with scattered nonobstructing right renal stones measuring up to 6 mm in the lower pole. No ureteral or bladder stones. No secondary changes in either kidney or ureter. Electronically Signed   By: Donnal Fusi M.D.   On: 01/28/2024 09:36    Procedures Procedures    Medications Ordered in ED Medications  sulfamethoxazole -trimethoprim  (BACTRIM  DS) 800-160 MG per tablet 1 tablet (has no  administration in time range)  predniSONE (DELTASONE) tablet 60 mg (has no administration in time range)  HYDROcodone -acetaminophen  (NORCO/VICODIN) 5-325 MG per tablet 1 tablet (has no administration in time range)  fentaNYL  (SUBLIMAZE ) injection 25 mcg (25 mcg Intravenous Given 01/28/24 0859)  ondansetron  (ZOFRAN ) injection 4 mg (4 mg Intravenous Given 01/28/24 0900)  sodium chloride  0.9 % bolus 1,000 mL (1,000 mLs Intravenous New Bag/Given 01/28/24 0900)    ED Course/ Medical Decision Making/ A&P                                 Medical Decision Making Adult with history of renal disease, kidney stones presents with nausea, vomiting, flank pain.  Concern for urinary tract etiology, though other considerations in the abdomen x-ray considered as well.  No overt evidence for bacteremia, sepsis.  Labs fluids fentanyl  Zofran  started CT ordered. Cardiac 85 sinus normal pulse ox 100% room air normal  Amount and/or Complexity of Data Reviewed External Data Reviewed: notes. Labs: ordered. Decision-making details documented in ED Course. Radiology: ordered and independent interpretation performed. Decision-making details documented in ED Course.  Risk Prescription drug management.    Per chart review, conversation history with dermatology: "Cleon Dacosta, MD - 01/28/2024 5:43 AM EDT Formatting of this note might be different from the original. I spoke with pt and expressed difficulty with managing her condition via MyChart / phone calls. I offered for her to come in for an appointment next week but she said she didn't think she could make it. I offered options of presenting to the ER including at [facility] if this is an emergency. She did not feel it was that bad. She said overall things are just not improving like she hoped, but states she is overall better from prior.  We can consider a prednisone burst if she is not improving next week. Deroofing the abdominal plaque will likely help her  significantly.   She will keep me apprised of progress. I would like her to continue the infliximab + triple antibiotic therapy as well as the buprenorphine pain patches for now.   Hal Flowers MD"  10:06 AM On my exam patient continues to have pain, is hemodynamically unremarkable.  Labs reviewed, discussed, no leukocytosis, no abnormal urinalysis, no evidence for bacteremia, sepsis.  Suspicion for kidney stone and cellulitis in the context of known renal disease as well as hidradenitis suppurativa.  Patient will start appropriate medications, and after reviewing her chart it  seems as though she has been in contact with her dermatologist recently, we will follow-up with him on Monday via telephone.         Final Clinical Impression(s) / ED Diagnoses Final diagnoses:  Flank pain  Kidney stone  Cellulitis of abdominal wall    Rx / DC Orders ED Discharge Orders          Ordered    HYDROcodone -acetaminophen  (NORCO/VICODIN) 5-325 MG tablet  Every 6 hours PRN        01/28/24 1006    predniSONE (DELTASONE) 20 MG tablet  Daily with breakfast        01/28/24 1006    sulfamethoxazole -trimethoprim  (BACTRIM  DS) 800-160 MG tablet  2 times daily        01/28/24 1006              Dorenda Gandy, MD 01/28/24 1007

## 2024-01-28 NOTE — ED Triage Notes (Signed)
 Pt has HS and goes to a dermatologist and was told if it gets worse to come to hospital. Abscess is on right lower abd and painful. Pt also c/o emesis and right flank pain x 3 days.
